# Patient Record
Sex: Female | Born: 1960 | Race: White | Hispanic: No | Marital: Married | Smoking: Current every day smoker
Health system: Southern US, Community
[De-identification: ages and names within clinical notes are randomized; demographics above are authoritative.]

## PROBLEM LIST (undated history)

## (undated) DIAGNOSIS — F329 Major depressive disorder, single episode, unspecified: Secondary | ICD-10-CM

## (undated) DIAGNOSIS — Z972 Presence of dental prosthetic device (complete) (partial): Secondary | ICD-10-CM

## (undated) DIAGNOSIS — F32A Depression, unspecified: Secondary | ICD-10-CM

## (undated) DIAGNOSIS — R011 Cardiac murmur, unspecified: Secondary | ICD-10-CM

## (undated) DIAGNOSIS — I499 Cardiac arrhythmia, unspecified: Secondary | ICD-10-CM

## (undated) DIAGNOSIS — K219 Gastro-esophageal reflux disease without esophagitis: Secondary | ICD-10-CM

## (undated) DIAGNOSIS — M1990 Unspecified osteoarthritis, unspecified site: Secondary | ICD-10-CM

## (undated) DIAGNOSIS — I10 Essential (primary) hypertension: Secondary | ICD-10-CM

## (undated) DIAGNOSIS — F419 Anxiety disorder, unspecified: Secondary | ICD-10-CM

## (undated) HISTORY — DX: Cardiac arrhythmia, unspecified: I49.9

## (undated) HISTORY — DX: Depression, unspecified: F32.A

## (undated) HISTORY — DX: Cardiac murmur, unspecified: R01.1

## (undated) HISTORY — DX: Anxiety disorder, unspecified: F41.9

## (undated) HISTORY — DX: Major depressive disorder, single episode, unspecified: F32.9

---

## 2007-04-20 ENCOUNTER — Emergency Department: Payer: Self-pay | Admitting: Emergency Medicine

## 2007-06-06 ENCOUNTER — Ambulatory Visit: Payer: Self-pay | Admitting: Unknown Physician Specialty

## 2007-06-13 ENCOUNTER — Ambulatory Visit: Payer: Self-pay | Admitting: Unknown Physician Specialty

## 2007-12-30 ENCOUNTER — Ambulatory Visit: Admission: RE | Admit: 2007-12-30 | Discharge: 2007-12-30 | Payer: Self-pay | Admitting: Orthopedic Surgery

## 2008-01-17 ENCOUNTER — Inpatient Hospital Stay (HOSPITAL_COMMUNITY): Admission: RE | Admit: 2008-01-17 | Discharge: 2008-01-20 | Payer: Self-pay | Admitting: Orthopedic Surgery

## 2008-01-17 ENCOUNTER — Encounter (INDEPENDENT_AMBULATORY_CARE_PROVIDER_SITE_OTHER): Payer: Self-pay | Admitting: Orthopedic Surgery

## 2008-08-31 HISTORY — PX: BACK SURGERY: SHX140

## 2010-05-01 ENCOUNTER — Emergency Department: Payer: Self-pay | Admitting: Emergency Medicine

## 2010-09-28 ENCOUNTER — Inpatient Hospital Stay: Payer: Self-pay | Admitting: Internal Medicine

## 2010-12-25 ENCOUNTER — Inpatient Hospital Stay: Payer: Self-pay | Admitting: Psychiatry

## 2011-01-13 NOTE — Op Note (Signed)
NAMEMARGARETA, Maria Hodge NO.:  [id number]   MEDICAL RECORD NO.:  [id number]          PATIENT TYPE:  INP   LOCATION:  5030                         FACILITY:  [facility]   PHYSICIAN:  Nelda Severe, MD      DATE OF BIRTH:  1960/09/25   DATE OF PROCEDURE:  01/17/2008  DATE OF DISCHARGE:                               OPERATIVE REPORT   SURGEON:  Nelda Severe, MD   ASSISTANT:  Lianne Cure, PA   PREOPERATIVE DIAGNOSES:  1. Status post L4-5 laminectomy.  2. L4-5 disk degeneration.  3. Spondylosis.   POSTOPERATIVE DIAGNOSES:  1. Status post L4-5 laminectomy.  2. L4-5 disk degeneration.  3. Spondylosis.   OPERATIVE PROCEDURE:  1. Left L4-5 laminectomy and foraminotomy.  2. Posterior interbody fusion L4-5 (left TLIF).  3. Posterolateral fusion L4-5 bilateral.  4. Harvest iliac crest graft, right posterior iliac crest.  5. Insertion interbody cage, L4-5.  6. Insertion pedicle screws bilaterally at L4 and L5.   OPERATIVE NOTE:  The patient was placed under general endotracheal  anesthesia.  A Foley catheter was placed in the bladder.  Prophylactic  antibiotics were administered.  The patient was positioned prone on a  Jackson frame.  Care was taken and positioned to the upper extremities  so as to avoid hyperflexion and abduction of the shoulders and so as to  avoid hyperflexion of the elbows.  The upper extremities were padded  from axillae to hands with foam.  The thighs, knees, shins, and ankles  were carefully supported on pillows with the hips and knees slightly  flexed.  Prior to positioning, the neurophysiologic technician attached  needle electrodes to the upper and lower extremities and scalp.   The previous midline incision was outlined with a skin marker and the  proposed incision marked proximal and distal to the previous incision.  A mark was also made for the proposed incision to harvest the right  posterior iliac crest graft.  The lumbar area was  then prepared with  DuraPrep and draped in a rectangular fashion.   We started by harvesting the right posterior iliac crest graft.  Incision was made into the dermis and then subcutaneous tissue was  injected with a mixture of 0.25% plain Marcaine and 1% lidocaine with  epinephrine.  The posterior iliac crest was exposed on the right side.  The gluteal fascia was released using cutting current from its origin  along the outer edge of the crest.  The muscle was elevated off the  ilium.   An osteotome was then used to harvest corticocancellous strips and a  gouge used to remove more bone from between the tables.  The bony defect  was packed with Gelfoam.  The gluteal fascia was reapposed to the iliac  crest using figure-of-eight #1 Vicryl sutures.   We then made a midline vertical incision as previously described and  excised the scar in elliptical fashion.  Dissection was carried down  onto the spinous processus of the lower lumbar spine and a cross-table  lateral radiograph taken with a Kocher and what proved to be the  L5  spinous process.  Subsequently, the L4 and L5 transverse processes were  exposed bilaterally.   Pedicle holes were made bilaterally at L4 and L5 in a usual fashion:  The junction of the superior articular process and transverse process  was identified.  At the midpoint of the junction, a hole was made using  a high-speed bur, followed by an awl to perforate the pedicle followed  by a pedicle probe to create a hole through the pedicle into the  vertebral body.  The hole was carefully probed with a ball-tip probe to  make sure it was circumferentially intact and then measured for depths  and the depths recorded.  Each hole was injected with FloSeal and a  radiopaque marker placed.  Cross-table lateral radiograph showed  satisfactory position of markers, although I felt that the L5 marker on  the right side was directed slightly to caudally and the hole was   redirected at the time of screw placement.   Next, we performed a left-sided laminectomy and inferior and superior  facetectomy at L4-5 on the left side, completely decompressing the  neural foramen.  Veins in the foraminal area were bipolar coagulated.  Bone was put through a bone mill from the locally harvested laminectomy  and inferior facetectomy and superior facetectomy.   Next, we decorticated the transverse processes on the right side at L4-  L5 and placed graft posterolaterally.  Screws were then placed in the  pedicles, and as noted the L5 hole was redirected somewhat more  proximally.  The screws were 6.5-mm diameter Theken screws.  Subsequent  to screw placement, each screw was stimulated electrically and distal  EMG activity in the lower extremities recorded.  In each instance, the  current necessary to elicit distal EMG activity.  It was above the  critical level and therefore was considered highly unlikely.  There was  any screw thread contact with a nerve root.  A pre-contoured rod was  provisionally placed and loosely coupled.   In the left side, we decorticated the transverse processes bilaterally  at L4 and L5 and placed graft posterolaterally again.  Screws were again  placed.  A working rod was placed between the screws.   We then retracted the dura at the origin of the L5 root on the left side  to allow further exposure of the disk.  The disk was fenestrated  posterolaterally used sharply.  Some initial curettage of the disk space  and removal of degenerate nucleus was carried out.  We then placed a  distraction paddle, intradiskal distractor into the disk space and  distracted up to 11 mm and then tightened the working rod.  We then  further curetted the disk space thoroughly preparing the endplates and  moderately large quantity of degenerate nucleus was removed.  We then  placed another intradiscal distractor and distract the disk space up to  13 mm and held it  in distracted position with the working rod.   We chose a 10-mm thick cage, which was packed with graft.  We used a  special funnel to deliver morcellized graft into the anterior disk space  prior to placing the cage.  The cage was placed without difficulty and  while it was impacted into place, the exiting L4 nerve root above was  protected with a Penfield #4 while the dura medially was protected with  a Love nerve root retractor.   We then removed the working rod and placed a new pre-contoured  rod and  provisionally attached it with the couplings on each end.   We then used a high-speed bur to resect the articular surfaces of the  right L4-5 facet joint and to decorticate the surface of the lamina at  L4 on the right side as well as the pars interarticularis of L5 on the  right side.  Graft was then packed in meticulously and packed into the  defect in the facet joint.  The rest of the graft was split between the  posterolateral graft on the right and left sides.   We then compressed the construct bilaterally and torqued all the  couplings.   A cross-table lateral and PA radiographs were taken.  These showed  satisfactory position of screws and rods.   I do not believe I mentioned above the both sides of this construct had  screw stimulation with distal EMG recordings prior to attaching the  rods.  These were all satisfactory with the results indicating little  likelihood of screw/ nerve contact.   The blood loss estimated 400 mL.  The patient received proximally 170 mL  of Cell Saver blood back.  The sponge and needle counts were correct.  There were no intraoperative complications.   The thoracolumbar fascia was closed using continuous #1 Vicryl suture  over a 15-gauge Blake drain, which was secured to the skin with a 2-0  nylon suture in basket weave fashion.  The 1-inch Hemovac drain was run  from the central wound through the bone graft harvest wound laterally  and also  secured with a 2-0 nylon suture in basket weave fashion.  Subcutaneous layer of both wounds was closed using interrupted and  continuous 2-0 Vicryl suture.  The skin was closed using continuous 3-0  undyed subcuticular Vicryl suture.  The skin edges were reinforced with  Steri-Strips.  A nonadherent antibiotic ointment dressing was applied  with OpSite to secure.   The patient has not awakened sufficiently to perform neurologic  examination.      Nelda Severe, MD  Electronically Signed     MT/MEDQ  D:  01/17/2008  T:  01/18/2008  Job:  [id number]

## 2011-01-13 NOTE — Discharge Summary (Signed)
Maria Hodge, Maria Hodge                ACCOUNT NO.:  [id number]   MEDICAL RECORD NO.:  [id number]          PATIENT TYPE:  INP   LOCATION:  5030                         FACILITY:  [facility]   PHYSICIAN:  Nelda Severe, MD      DATE OF BIRTH:  09-Feb-1961   DATE OF ADMISSION:  01/17/2008  DATE OF DISCHARGE:  01/20/2008                               DISCHARGE SUMMARY   REASON FOR ADMISSION/DIAGNOSES:  1. Status post L4-L5 laminectomy.  2. L4-L5 disk degeneration and spondylosis.   OPERATIVE PROCEDURE:  1. Left L4-L5 laminectomy, foraminotomy, posterior interbody fusion.  2. TLIF, posterior lateral fusion with iliac crest bone harvest on the      right, interbody cage, pedicle screws and rods at L4-L5.   Postoperatively, the patient was doing well.  She had a blood loss of  400 mL approximately.  She had good bilateral dorsiflexion.  Sensation  was grossly intact.  Significant decrease in left leg pain.  Postop day  #1, the patient was stable.  We did have the physical therapy evaluation  for ambulation and mobility.  Postop day #2, went to remove the drain.  The [place] drain was removed without difficulty.  The Hemovac drain was  sutured into the wound.  Dr. Casimiro Needle took the patient back to the OR to  remove the drain on Jan 19, 2008.  Procedure went well, just removal of  a couple sutures and the drain came out without any problems, negligible  blood loss.  The patient awoke with continued relief of leg pain on the  left.  Today, on Jan 20, 2008, the patient is afebrile.  Vital signs are  stable.  She is ambulating with a rolling walker independently.  Still  has good relief of pain.  Calves are soft.  Sensation to light touch is  grossly intact and equal.  Manual muscle testing is 5/5, bilateral lower  extremities, grossly equal.   DISCHARGE DIAGNOSIS:  1. Status post L4-L5 laminectomy.  2. L4-L5 disk degeneration and spondylosis.   DIET:  Regular.   DISPOSITION:  Stable.   PLAN:  She  is going to ambulate.  For exercise, we are giving her  rolling walker for home use and send her home on OxyContin 40 b.i.d.,  Percocet 5/325 b.i.d., and Robaxin 500 one to two q. 6 p.r.n. pain  control.  She has a followup  appointment on June 18.  She is going to maintain a dry dressing until  no drainage is on her dressing.  She may shower and then put a clean dry  dressing on and if she has any troubles or concerns, she will have to  call us.  She has to use her incentive spirometer regularly as she is a  smoker.      Lianne Cure, P.A.      Nelda Severe, MD  Electronically Signed    MC/MEDQ  D:  01/20/2008  T:  01/21/2008  Job:  [id number]

## 2011-01-13 NOTE — Op Note (Signed)
Maria Hodge, SAGER NO.:  [id number]   MEDICAL RECORD NO.:  [id number]          PATIENT TYPE:   LOCATION:                                 FACILITY:   PHYSICIAN:  Maria Severe, MD           DATE OF BIRTH:   DATE OF PROCEDURE:  01/19/2008  DATE OF DISCHARGE:                               OPERATIVE REPORT   SURGEON:  Maria Severe, MD.   ASSISTANT:  None.   PREOPERATIVE DIAGNOSIS:  Retained Hemovac drain subcutaneous layer,  status post lumbar laminectomy infusion.   POSTPROCEDURE DIAGNOSIS:  Retained Hemovac drain subcutaneous layer,  status post lumbar laminectomy infusion.   PROCEDURE:  Removal of Hemovac drain, iliac crest wound subcutaneous  layer.   OPERATIVE NOTE:  The patient was placed under general endotracheal  anesthesia.  She was positioned prone on a Wilson frame.  The lumbar  area was generously prepped with Betadine.  Square draping was applied.   Prior to prepping, I cut the Hemovac tubing leading about 1-inch stick  out of the skin.   Tracting on the Hemovac drain revealed the location of tethering, the  distal portion of the ileac crest wound.  The wound was spread apart and  about 3 or 4 subcuticular stitches removed and two subcutaneous stitches  removed.  The drain was then easily pulled out.  Two inverted 2-0 Vicryl  sutures were placed in a subcutaneous layer after thoroughly irrigating  the wound with saline.  The distal 1-inch of the wound was reapposed  using subcuticular a 3-0 undyed Vicryl in continuous fashion.   The blood loss was less than 5 mL.  There were no intraoperative  complications.  At the time of dictation, the patient has not been  examined in recovery room.  Sponge and needle counts were correct.   A Xeroform gauze dressing was applied to both lumbar and iliac crest  wounds and the dressing secured with OpSite.      Maria Severe, MD  Electronically Signed     MT/MEDQ  D:  01/19/2008  T:   01/20/2008  Job:  [id number]

## 2011-03-30 ENCOUNTER — Inpatient Hospital Stay: Payer: Self-pay | Admitting: Unknown Physician Specialty

## 2011-05-27 LAB — COMPREHENSIVE METABOLIC PANEL
ALT: 17
AST: 17
Calcium: 9.5
GFR calc Af Amer: >60
Sodium: 137
Total Protein: 7.7

## 2011-05-27 LAB — CBC
HCT: 36.1
HCT: 37.5
Hemoglobin: 12.4
MCHC: 34.3
MCHC: 34.5
MCHC: 34.8
MCV: 90.9
MCV: 91
MCV: 91.2
Platelets: 161
Platelets: 184
RBC: 3.85 — ABNORMAL LOW
RBC: 3.97
RDW: 14.3
WBC: 13.5 — ABNORMAL HIGH
WBC: 16.5 — ABNORMAL HIGH
WBC: 18.3 — ABNORMAL HIGH

## 2011-05-27 LAB — URINE MICROSCOPIC-ADD ON

## 2011-05-27 LAB — BASIC METABOLIC PANEL
BUN: 2 — ABNORMAL LOW
BUN: 3 — ABNORMAL LOW
CO2: 26
CO2: 26
Calcium: 8.7
Chloride: 102
Chloride: 106
Chloride: 108
Creatinine, Ser: 0.49
Creatinine, Ser: 0.6
GFR calc Af Amer: >60
GFR calc Af Amer: >60
GFR calc non Af Amer: >60
Potassium: 3.5
Potassium: 3.6
Sodium: 137

## 2011-05-27 LAB — URINALYSIS, ROUTINE W REFLEX MICROSCOPIC
Leukocytes, UA: NEGATIVE
Nitrite: NEGATIVE
Specific Gravity, Urine: 1.016
Urobilinogen, UA: 0.2

## 2011-05-27 LAB — DIFFERENTIAL
Eosinophils Absolute: 0
Eosinophils Relative: 0
Lymphocytes Relative: 21
Lymphs Abs: 2
Monocytes Relative: 4
Neutrophils Relative %: 73

## 2011-05-27 LAB — TYPE AND SCREEN
ABO/RH(D): O NEG
Antibody Screen: NEGATIVE

## 2011-05-27 LAB — PROTIME-INR: INR: 0.9

## 2011-05-27 LAB — URINE CULTURE

## 2011-06-04 ENCOUNTER — Inpatient Hospital Stay: Payer: Self-pay | Admitting: Psychiatry

## 2011-09-10 ENCOUNTER — Inpatient Hospital Stay: Payer: Self-pay | Admitting: Psychiatry

## 2011-09-10 LAB — CBC
HGB: 15.9 g/dL (ref 12.0–16.0)
MCH: 29.7 pg (ref 26.0–34.0)
MCHC: 33.1 g/dL (ref 32.0–36.0)
RBC: 5.37 10*6/uL — ABNORMAL HIGH (ref 3.80–5.20)
RDW: 15 % — ABNORMAL HIGH (ref 11.5–14.5)
WBC: 12 10*3/uL — ABNORMAL HIGH (ref 3.6–11.0)

## 2011-09-10 LAB — URINALYSIS, COMPLETE
Bacteria: NONE SEEN
Ketone: NEGATIVE
Nitrite: NEGATIVE
Protein: NEGATIVE
RBC,UR: 1 /HPF (ref 0–5)
Specific Gravity: 1.005 (ref 1.003–1.030)
WBC UR: <1 /HPF (ref 0–5)

## 2011-09-10 LAB — DRUG SCREEN, URINE
Barbiturates, Ur Screen: NEGATIVE (ref ?–200)
Cocaine Metabolite,Ur ~~LOC~~: NEGATIVE (ref ?–300)
Tricyclic, Ur Screen: NEGATIVE (ref ?–1000)

## 2011-09-10 LAB — COMPREHENSIVE METABOLIC PANEL
Albumin: 4.5 g/dL (ref 3.4–5.0)
Anion Gap: 9 (ref 7–16)
BUN: 12 mg/dL (ref 7–18)
Chloride: 102 mmol/L (ref 98–107)
EGFR (African American): >60
Osmolality: 277 (ref 275–301)
Potassium: 3.9 mmol/L (ref 3.5–5.1)
SGOT(AST): 19 U/L (ref 15–37)
Sodium: 139 mmol/L (ref 136–145)
Total Protein: 8.8 g/dL — ABNORMAL HIGH (ref 6.4–8.2)

## 2011-09-10 LAB — ETHANOL: Ethanol %: <0.003 % (ref 0.000–0.080)

## 2011-09-24 ENCOUNTER — Emergency Department: Payer: Self-pay | Admitting: *Deleted

## 2011-09-24 LAB — COMPREHENSIVE METABOLIC PANEL
Albumin: 4.3 g/dL (ref 3.4–5.0)
Anion Gap: 11 (ref 7–16)
BUN: 10 mg/dL (ref 7–18)
Calcium, Total: 9.6 mg/dL (ref 8.5–10.1)
Chloride: 103 mmol/L (ref 98–107)
Co2: 28 mmol/L (ref 21–32)
EGFR (African American): >60
EGFR (Non-African Amer.): >60
Glucose: 120 mg/dL — ABNORMAL HIGH (ref 65–99)
Osmolality: 283 (ref 275–301)
Potassium: 3.5 mmol/L (ref 3.5–5.1)
SGOT(AST): 19 U/L (ref 15–37)

## 2011-09-24 LAB — URINALYSIS, COMPLETE
Blood: NEGATIVE
Glucose,UR: NEGATIVE mg/dL (ref 0–75)
Hyaline Cast: 9
Nitrite: NEGATIVE
Specific Gravity: 1.028 (ref 1.003–1.030)
Squamous Epithelial: 3
WBC UR: 5 /HPF (ref 0–5)

## 2011-09-24 LAB — LIPASE, BLOOD: Lipase: 87 U/L (ref 73–393)

## 2011-09-24 LAB — CBC
HCT: 43.9 % (ref 35.0–47.0)
RDW: 16.1 % — ABNORMAL HIGH (ref 11.5–14.5)
WBC: 14.9 10*3/uL — ABNORMAL HIGH (ref 3.6–11.0)

## 2011-11-02 ENCOUNTER — Emergency Department: Payer: Self-pay | Admitting: Emergency Medicine

## 2011-11-02 LAB — DRUG SCREEN, URINE
Amphetamines, Ur Screen: NEGATIVE (ref ?–1000)
Cannabinoid 50 Ng, Ur ~~LOC~~: POSITIVE (ref ?–50)
Cocaine Metabolite,Ur ~~LOC~~: NEGATIVE (ref ?–300)
MDMA (Ecstasy)Ur Screen: NEGATIVE (ref ?–500)
Phencyclidine (PCP) Ur S: NEGATIVE (ref ?–25)

## 2011-11-02 LAB — CBC
HCT: 43.7 % (ref 35.0–47.0)
HGB: 14.3 g/dL (ref 12.0–16.0)
MCH: 30.1 pg (ref 26.0–34.0)
MCV: 92 fL (ref 80–100)
RBC: 4.75 10*6/uL (ref 3.80–5.20)
WBC: 8.1 10*3/uL (ref 3.6–11.0)

## 2011-11-02 LAB — SALICYLATE LEVEL: Salicylates, Serum: 4.6 mg/dL — ABNORMAL HIGH

## 2011-11-02 LAB — COMPREHENSIVE METABOLIC PANEL
Anion Gap: 10 (ref 7–16)
Bilirubin,Total: 0.4 mg/dL (ref 0.2–1.0)
Chloride: 105 mmol/L (ref 98–107)
Co2: 26 mmol/L (ref 21–32)
Creatinine: 0.73 mg/dL (ref 0.60–1.30)
EGFR (African American): >60
EGFR (Non-African Amer.): >60
Osmolality: 280 (ref 275–301)
Potassium: 3.7 mmol/L (ref 3.5–5.1)
Sodium: 141 mmol/L (ref 136–145)

## 2011-11-02 LAB — TSH: Thyroid Stimulating Horm: 0.81 u[IU]/mL

## 2011-11-02 LAB — ETHANOL
Ethanol %: <0.003 % (ref 0.000–0.080)
Ethanol: <3 mg/dL

## 2011-11-17 ENCOUNTER — Ambulatory Visit: Payer: Self-pay

## 2011-12-21 ENCOUNTER — Ambulatory Visit (HOSPITAL_BASED_OUTPATIENT_CLINIC_OR_DEPARTMENT_OTHER)
Admission: RE | Admit: 2011-12-21 | Discharge: 2011-12-21 | Disposition: A | Discharge status: Home / Self Care | Payer: Medicare Other | Source: Ambulatory Visit | Attending: Orthopedic Surgery | Admitting: Orthopedic Surgery

## 2011-12-21 ENCOUNTER — Ambulatory Visit (HOSPITAL_COMMUNITY): Payer: Medicare Other

## 2011-12-21 ENCOUNTER — Encounter (HOSPITAL_BASED_OUTPATIENT_CLINIC_OR_DEPARTMENT_OTHER)
Admission: RE | Disposition: A | Discharge status: Home / Self Care | Payer: Self-pay | Source: Ambulatory Visit | Attending: Orthopedic Surgery

## 2011-12-21 DIAGNOSIS — M961 Postlaminectomy syndrome, not elsewhere classified: Secondary | ICD-10-CM | POA: Insufficient documentation

## 2011-12-21 DIAGNOSIS — Z981 Arthrodesis status: Secondary | ICD-10-CM | POA: Insufficient documentation

## 2011-12-21 HISTORY — PX: STERIOD INJECTION: SHX5046

## 2011-12-21 SURGERY — MINOR STEROID INJECTION
Anesthesia: LOCAL | Site: Back | Laterality: Right

## 2011-12-21 MED ORDER — IOHEXOL 300 MG/ML  SOLN
INTRAMUSCULAR | Status: DC | PRN
  Administered 2011-12-21: .5 mL via EPIDURAL

## 2011-12-21 MED ORDER — METHYLPREDNISOLONE ACETATE 40 MG/ML IJ SUSP
INTRAMUSCULAR | Status: DC | PRN
  Administered 2011-12-21: 12:00:00 via EPIDURAL

## 2011-12-21 SURGICAL SUPPLY — 14 items
BANDAGE ADHESIVE 1X3 (GAUZE/BANDAGES/DRESSINGS) ×4 IMPLANT
CHLORAPREP W/TINT 26ML (MISCELLANEOUS) ×2 IMPLANT
GLOVE BIO SURGEON STRL SZ8 (GLOVE) ×4 IMPLANT
GLOVE SS BIOGEL STRL SZ 8.5 (GLOVE) ×2 IMPLANT
GLOVE SUPERSENSE BIOGEL SZ 8.5 (GLOVE) ×2
NDL SAFETY ECLIPSE 18X1.5 (NEEDLE) ×2 IMPLANT
NEEDLE HYPO 18GX1.5 SHARP (NEEDLE) ×2
NEEDLE SPNL 22GX3.5 QUINCKE BK (NEEDLE) ×2 IMPLANT
NEEDLE SPNL 22GX5 LNG QUINC BK (NEEDLE) IMPLANT
NEEDLE SPNL 22GX7 QUINCKE BK (NEEDLE) IMPLANT
SYR 3ML 23GX1 SAFETY (SYRINGE) ×2 IMPLANT
SYR 5ML LL (SYRINGE) ×2 IMPLANT
SYR CONTROL 10ML LL (SYRINGE) IMPLANT
TOWEL OR 17X24 6PK STRL BLUE (TOWEL DISPOSABLE) ×2 IMPLANT

## 2011-12-21 NOTE — Op Note (Signed)
Preprocedure diagnosis: Right L4, L3 nerve root pain   Post procedure diagnosis: The same  Procedure: Right L3-4 transforaminal epidural steroid injection, attempted right L4-5 epidural steroid injection( 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); fluoroscopic guidance  Procedure note:  3 mL of Omnipaque were drawn into a 3 cc syringe. In a  5 cc syringe, 2 mL of preservative-free, plain 1/4 % Marcaine were mixed with 2 mL of preservative free 1% plain lidocaine and 40 mg of Depo-Medrol. The patient was positioned prone with a bump under the right hip to provide oblique angulation. A 22-gauge spinal needle was introduced at the L3-4 interval and the tip advanced to the L3-4 neural foramen on the right side. Omnipaque was injected and flow epidurally. 1.5 cc of injectate was delivered. The needle was withdrawn.  An attempt was then made to position the needle tip at the L4-5 neural foramen, secondary to prior fusion, I was unable to advance the needle into the foraminal area. The needle was withdrawn. Pressure was applied to the needle tract at L4-5 as well as a small amount of bleeding.  Subsequent to the procedure the patient was transferred back to the preop area. No sedation was used.

## 2011-12-22 ENCOUNTER — Encounter (HOSPITAL_BASED_OUTPATIENT_CLINIC_OR_DEPARTMENT_OTHER): Payer: Self-pay

## 2011-12-22 ENCOUNTER — Encounter (HOSPITAL_BASED_OUTPATIENT_CLINIC_OR_DEPARTMENT_OTHER): Payer: Self-pay | Admitting: Orthopedic Surgery

## 2012-01-24 ENCOUNTER — Inpatient Hospital Stay: Payer: Self-pay | Admitting: Psychiatry

## 2012-01-24 LAB — DRUG SCREEN, URINE
Amphetamines, Ur Screen: NEGATIVE (ref ?–1000)
Benzodiazepine, Ur Scrn: POSITIVE (ref ?–200)
MDMA (Ecstasy)Ur Screen: NEGATIVE (ref ?–500)
Methadone, Ur Screen: NEGATIVE (ref ?–300)
Phencyclidine (PCP) Ur S: NEGATIVE (ref ?–25)
Tricyclic, Ur Screen: POSITIVE (ref ?–1000)

## 2012-01-24 LAB — URINALYSIS, COMPLETE
Bilirubin,UR: NEGATIVE
Blood: NEGATIVE
Glucose,UR: NEGATIVE mg/dL (ref 0–75)
Hyaline Cast: 2
Nitrite: NEGATIVE
Ph: 7 (ref 4.5–8.0)
Protein: 30
Specific Gravity: 1.018 (ref 1.003–1.030)
Squamous Epithelial: 5
WBC UR: 5 /HPF (ref 0–5)

## 2012-01-24 LAB — COMPREHENSIVE METABOLIC PANEL
Alkaline Phosphatase: 87 U/L (ref 50–136)
Anion Gap: 10 (ref 7–16)
Bilirubin,Total: 0.5 mg/dL (ref 0.2–1.0)
Calcium, Total: 10.1 mg/dL (ref 8.5–10.1)
Co2: 26 mmol/L (ref 21–32)
Creatinine: 0.71 mg/dL (ref 0.60–1.30)
EGFR (African American): >60
Glucose: 120 mg/dL — ABNORMAL HIGH (ref 65–99)
Potassium: 4 mmol/L (ref 3.5–5.1)
SGOT(AST): 13 U/L — ABNORMAL LOW (ref 15–37)
Sodium: 132 mmol/L — ABNORMAL LOW (ref 136–145)

## 2012-01-24 LAB — CBC
HCT: 48.6 % — ABNORMAL HIGH (ref 35.0–47.0)
HGB: 15.9 g/dL (ref 12.0–16.0)
MCH: 29.9 pg (ref 26.0–34.0)
Platelet: 266 10*3/uL (ref 150–440)
RDW: 14 % (ref 11.5–14.5)

## 2012-01-24 LAB — ETHANOL: Ethanol %: <0.003 % (ref 0.000–0.080)

## 2012-01-26 LAB — URINALYSIS, COMPLETE
Bacteria: NONE SEEN
Bilirubin,UR: NEGATIVE
Blood: NEGATIVE
Leukocyte Esterase: NEGATIVE
Ph: 7 (ref 4.5–8.0)
WBC UR: 3 /HPF (ref 0–5)

## 2012-01-26 LAB — CBC WITH DIFFERENTIAL/PLATELET
Basophil #: 0.1 10*3/uL (ref 0.0–0.1)
Basophil %: 1 %
Eosinophil #: 0.3 10*3/uL (ref 0.0–0.7)
HGB: 14.4 g/dL (ref 12.0–16.0)
Lymphocyte #: 3.7 10*3/uL — ABNORMAL HIGH (ref 1.0–3.6)
Lymphocyte %: 34.3 %
Monocyte %: 7.9 %
Neutrophil #: 5.9 10*3/uL (ref 1.4–6.5)
RBC: 4.78 10*6/uL (ref 3.80–5.20)
RDW: 13.8 % (ref 11.5–14.5)

## 2012-01-26 LAB — T4, FREE: Free Thyroxine: 1.1 ng/dL (ref 0.76–1.46)

## 2012-02-22 ENCOUNTER — Inpatient Hospital Stay: Payer: Self-pay | Admitting: Psychiatry

## 2012-02-22 LAB — COMPREHENSIVE METABOLIC PANEL
Anion Gap: 8 (ref 7–16)
BUN: 14 mg/dL (ref 7–18)
Bilirubin,Total: 0.4 mg/dL (ref 0.2–1.0)
Calcium, Total: 9.3 mg/dL (ref 8.5–10.1)
Chloride: 101 mmol/L (ref 98–107)
Creatinine: 0.75 mg/dL (ref 0.60–1.30)
EGFR (African American): >60
EGFR (Non-African Amer.): >60
Glucose: 112 mg/dL — ABNORMAL HIGH (ref 65–99)
Osmolality: 271 (ref 275–301)
Potassium: 4.2 mmol/L (ref 3.5–5.1)
SGOT(AST): 16 U/L (ref 15–37)
Sodium: 135 mmol/L — ABNORMAL LOW (ref 136–145)

## 2012-02-22 LAB — CBC
HCT: 45 % (ref 35.0–47.0)
MCH: 30.7 pg (ref 26.0–34.0)
MCHC: 33.5 g/dL (ref 32.0–36.0)
MCV: 92 fL (ref 80–100)
Platelet: 212 10*3/uL (ref 150–440)
RDW: 14.2 % (ref 11.5–14.5)

## 2012-02-22 LAB — DRUG SCREEN, URINE
Cannabinoid 50 Ng, Ur ~~LOC~~: POSITIVE (ref ?–50)
Cocaine Metabolite,Ur ~~LOC~~: NEGATIVE (ref ?–300)
Tricyclic, Ur Screen: POSITIVE (ref ?–1000)

## 2012-02-23 LAB — URINALYSIS, COMPLETE
Bacteria: NONE SEEN
Glucose,UR: NEGATIVE mg/dL (ref 0–75)
Ketone: NEGATIVE
Nitrite: NEGATIVE
Ph: 5 (ref 4.5–8.0)
Protein: NEGATIVE
Specific Gravity: 1.028 (ref 1.003–1.030)

## 2012-02-24 LAB — LIPID PANEL
Cholesterol: 199 mg/dL (ref 0–200)
HDL Cholesterol: 46 mg/dL (ref 40–60)
Triglycerides: 62 mg/dL (ref 0–200)

## 2012-04-02 ENCOUNTER — Inpatient Hospital Stay: Payer: Self-pay | Admitting: Psychiatry

## 2012-04-02 LAB — COMPREHENSIVE METABOLIC PANEL
Albumin: 4.1 g/dL (ref 3.4–5.0)
Anion Gap: 7 (ref 7–16)
BUN: 13 mg/dL (ref 7–18)
Bilirubin,Total: 0.3 mg/dL (ref 0.2–1.0)
Chloride: 101 mmol/L (ref 98–107)
EGFR (African American): >60
Potassium: 3.8 mmol/L (ref 3.5–5.1)
SGOT(AST): 23 U/L (ref 15–37)
Total Protein: 8.4 g/dL — ABNORMAL HIGH (ref 6.4–8.2)

## 2012-04-02 LAB — URINALYSIS, COMPLETE
Bilirubin,UR: NEGATIVE
Glucose,UR: NEGATIVE mg/dL (ref 0–75)
Leukocyte Esterase: NEGATIVE
Nitrite: NEGATIVE
Ph: 7 (ref 4.5–8.0)
Protein: NEGATIVE
RBC,UR: 1 /HPF (ref 0–5)
WBC UR: <1 /HPF (ref 0–5)

## 2012-04-02 LAB — CBC
HCT: 44.6 % (ref 35.0–47.0)
HGB: 14.8 g/dL (ref 12.0–16.0)
MCHC: 33.3 g/dL (ref 32.0–36.0)
RBC: 4.83 10*6/uL (ref 3.80–5.20)

## 2012-04-02 LAB — DRUG SCREEN, URINE
Amphetamines, Ur Screen: NEGATIVE (ref ?–1000)
Barbiturates, Ur Screen: NEGATIVE (ref ?–200)
Cocaine Metabolite,Ur ~~LOC~~: NEGATIVE (ref ?–300)
Methadone, Ur Screen: NEGATIVE (ref ?–300)
Opiate, Ur Screen: POSITIVE (ref ?–300)
Tricyclic, Ur Screen: POSITIVE (ref ?–1000)

## 2012-04-02 LAB — TSH: Thyroid Stimulating Horm: 1.41 u[IU]/mL

## 2012-04-02 LAB — ETHANOL: Ethanol %: <0.003 % (ref 0.000–0.080)

## 2012-04-02 LAB — ACETAMINOPHEN LEVEL: Acetaminophen: 5 ug/mL — ABNORMAL LOW

## 2012-04-11 ENCOUNTER — Ambulatory Visit: Payer: Self-pay | Admitting: Pain Medicine

## 2012-05-03 ENCOUNTER — Ambulatory Visit: Payer: Self-pay | Admitting: Pain Medicine

## 2012-05-18 ENCOUNTER — Ambulatory Visit: Payer: Self-pay | Admitting: Pain Medicine

## 2012-05-27 ENCOUNTER — Inpatient Hospital Stay: Payer: Self-pay | Admitting: Psychiatry

## 2012-05-27 LAB — COMPREHENSIVE METABOLIC PANEL
Albumin: 4 g/dL (ref 3.4–5.0)
Alkaline Phosphatase: 88 U/L (ref 50–136)
Calcium, Total: 9.5 mg/dL (ref 8.5–10.1)
Co2: 26 mmol/L (ref 21–32)
EGFR (Non-African Amer.): >60
Glucose: 110 mg/dL — ABNORMAL HIGH (ref 65–99)
SGOT(AST): 15 U/L (ref 15–37)
SGPT (ALT): 15 U/L (ref 12–78)
Sodium: 140 mmol/L (ref 136–145)

## 2012-05-27 LAB — DRUG SCREEN, URINE
Amphetamines, Ur Screen: NEGATIVE (ref ?–1000)
Barbiturates, Ur Screen: NEGATIVE (ref ?–200)
Cocaine Metabolite,Ur ~~LOC~~: NEGATIVE (ref ?–300)
MDMA (Ecstasy)Ur Screen: NEGATIVE (ref ?–500)
Opiate, Ur Screen: POSITIVE (ref ?–300)
Tricyclic, Ur Screen: NEGATIVE (ref ?–1000)

## 2012-05-27 LAB — CBC
MCH: 30.6 pg (ref 26.0–34.0)
MCHC: 33.7 g/dL (ref 32.0–36.0)
MCV: 91 fL (ref 80–100)
Platelet: 222 10*3/uL (ref 150–440)
RBC: 4.85 10*6/uL (ref 3.80–5.20)
RDW: 13.7 % (ref 11.5–14.5)

## 2012-05-27 LAB — URINALYSIS, COMPLETE
Blood: NEGATIVE
Ketone: NEGATIVE
Leukocyte Esterase: NEGATIVE
Nitrite: NEGATIVE
Ph: 7 (ref 4.5–8.0)
Protein: NEGATIVE
RBC,UR: 5 /HPF (ref 0–5)
Specific Gravity: 1.018 (ref 1.003–1.030)
WBC UR: <1 /HPF (ref 0–5)

## 2012-05-27 LAB — SALICYLATE LEVEL: Salicylates, Serum: 3.7 mg/dL — ABNORMAL HIGH

## 2012-05-27 LAB — ACETAMINOPHEN LEVEL: Acetaminophen: <2 ug/mL

## 2012-06-03 LAB — LIPID PANEL: Triglycerides: 64 mg/dL (ref 0–200)

## 2012-06-07 ENCOUNTER — Ambulatory Visit: Payer: Self-pay | Admitting: Pain Medicine

## 2012-06-22 ENCOUNTER — Ambulatory Visit: Payer: Self-pay | Admitting: Pain Medicine

## 2012-06-28 ENCOUNTER — Ambulatory Visit: Payer: Self-pay | Admitting: Pain Medicine

## 2012-07-05 ENCOUNTER — Inpatient Hospital Stay: Payer: Self-pay | Admitting: Psychiatry

## 2012-07-05 LAB — DRUG SCREEN, URINE
Amphetamines, Ur Screen: NEGATIVE (ref ?–1000)
Benzodiazepine, Ur Scrn: POSITIVE (ref ?–200)
Cannabinoid 50 Ng, Ur ~~LOC~~: POSITIVE (ref ?–50)
MDMA (Ecstasy)Ur Screen: NEGATIVE (ref ?–500)
Methadone, Ur Screen: NEGATIVE (ref ?–300)

## 2012-07-05 LAB — URINALYSIS, COMPLETE
Bacteria: NONE SEEN
Bilirubin,UR: NEGATIVE
Blood: NEGATIVE
Glucose,UR: NEGATIVE mg/dL (ref 0–75)
Leukocyte Esterase: NEGATIVE
Nitrite: NEGATIVE
Squamous Epithelial: <1
WBC UR: 1 /HPF (ref 0–5)

## 2012-07-05 LAB — COMPREHENSIVE METABOLIC PANEL
Albumin: 4.1 g/dL (ref 3.4–5.0)
Anion Gap: 8 (ref 7–16)
Calcium, Total: 9.2 mg/dL (ref 8.5–10.1)
Chloride: 104 mmol/L (ref 98–107)
Co2: 29 mmol/L (ref 21–32)
EGFR (African American): >60
Glucose: 115 mg/dL — ABNORMAL HIGH (ref 65–99)
Osmolality: 282 (ref 275–301)
Potassium: 4.1 mmol/L (ref 3.5–5.1)
Sodium: 141 mmol/L (ref 136–145)

## 2012-07-05 LAB — ACETAMINOPHEN LEVEL: Acetaminophen: <2 ug/mL

## 2012-07-05 LAB — CBC
HCT: 43.1 % (ref 35.0–47.0)
HGB: 14 g/dL (ref 12.0–16.0)
RDW: 14.8 % — ABNORMAL HIGH (ref 11.5–14.5)
WBC: 14.4 10*3/uL — ABNORMAL HIGH (ref 3.6–11.0)

## 2012-07-05 LAB — SALICYLATE LEVEL: Salicylates, Serum: <1.7 mg/dL

## 2012-07-05 LAB — TSH: Thyroid Stimulating Horm: 1.71 u[IU]/mL

## 2012-07-05 LAB — ETHANOL: Ethanol: <3 mg/dL

## 2012-07-06 LAB — CBC WITH DIFFERENTIAL/PLATELET
Basophil %: 0.6 %
Eosinophil %: 0.6 %
HCT: 43.6 % (ref 35.0–47.0)
Lymphocyte %: 13.5 %
MCH: 29.7 pg (ref 26.0–34.0)
MCHC: 32.8 g/dL (ref 32.0–36.0)
Monocyte %: 7.9 %
Neutrophil #: 9.2 10*3/uL — ABNORMAL HIGH (ref 1.4–6.5)
Neutrophil %: 77.4 %
RDW: 14.4 % (ref 11.5–14.5)

## 2012-07-06 LAB — COMPREHENSIVE METABOLIC PANEL
Albumin: 3.9 g/dL (ref 3.4–5.0)
Anion Gap: 9 (ref 7–16)
BUN: 9 mg/dL (ref 7–18)
Glucose: 108 mg/dL — ABNORMAL HIGH (ref 65–99)
Osmolality: 271 (ref 275–301)
Potassium: 4 mmol/L (ref 3.5–5.1)
SGOT(AST): 18 U/L (ref 15–37)
Sodium: 136 mmol/L (ref 136–145)
Total Protein: 8 g/dL (ref 6.4–8.2)

## 2012-07-07 LAB — CK: CK, Total: 50 U/L (ref 21–215)

## 2012-07-07 LAB — BASIC METABOLIC PANEL
Calcium, Total: 9.5 mg/dL (ref 8.5–10.1)
Chloride: 98 mmol/L (ref 98–107)
Co2: 27 mmol/L (ref 21–32)
Osmolality: 269 (ref 275–301)
Potassium: 4.5 mmol/L (ref 3.5–5.1)
Sodium: 134 mmol/L — ABNORMAL LOW (ref 136–145)

## 2012-07-07 LAB — CBC WITH DIFFERENTIAL/PLATELET
Basophil %: 0.7 %
Eosinophil #: 0.1 10*3/uL (ref 0.0–0.7)
HCT: 45 % (ref 35.0–47.0)
MCH: 30.4 pg (ref 26.0–34.0)
MCV: 91 fL (ref 80–100)
Monocyte #: 0.7 x10 3/mm (ref 0.2–0.9)
Neutrophil %: 76.3 %
Platelet: 254 10*3/uL (ref 150–440)

## 2012-07-20 ENCOUNTER — Ambulatory Visit: Payer: Self-pay | Admitting: Pain Medicine

## 2012-08-18 ENCOUNTER — Ambulatory Visit: Payer: Self-pay | Admitting: Pain Medicine

## 2012-09-19 ENCOUNTER — Ambulatory Visit: Payer: Self-pay | Admitting: Pain Medicine

## 2012-10-17 ENCOUNTER — Ambulatory Visit: Payer: Self-pay | Admitting: Pain Medicine

## 2013-01-20 ENCOUNTER — Inpatient Hospital Stay: Payer: Self-pay | Admitting: Psychiatry

## 2013-01-20 LAB — COMPREHENSIVE METABOLIC PANEL
Albumin: 4.1 g/dL (ref 3.4–5.0)
Alkaline Phosphatase: 97 U/L (ref 50–136)
Anion Gap: 7 (ref 7–16)
BUN: 8 mg/dL (ref 7–18)
Bilirubin,Total: 0.3 mg/dL (ref 0.2–1.0)
Calcium, Total: 9.7 mg/dL (ref 8.5–10.1)
Co2: 27 mmol/L (ref 21–32)
Creatinine: 0.95 mg/dL (ref 0.60–1.30)
EGFR (African American): >60
EGFR (Non-African Amer.): >60
Glucose: 89 mg/dL (ref 65–99)
Osmolality: 273 (ref 275–301)
SGPT (ALT): 16 U/L (ref 12–78)
Sodium: 138 mmol/L (ref 136–145)
Total Protein: 8.7 g/dL — ABNORMAL HIGH (ref 6.4–8.2)

## 2013-01-20 LAB — DRUG SCREEN, URINE
Amphetamines, Ur Screen: NEGATIVE (ref ?–1000)
Barbiturates, Ur Screen: NEGATIVE (ref ?–200)
Cocaine Metabolite,Ur ~~LOC~~: NEGATIVE (ref ?–300)
MDMA (Ecstasy)Ur Screen: NEGATIVE (ref ?–500)
Opiate, Ur Screen: POSITIVE (ref ?–300)
Tricyclic, Ur Screen: POSITIVE (ref ?–1000)

## 2013-01-20 LAB — URINALYSIS, COMPLETE
Bilirubin,UR: NEGATIVE
Hyaline Cast: 2
Nitrite: NEGATIVE
Protein: NEGATIVE
Specific Gravity: 1.019 (ref 1.003–1.030)
Squamous Epithelial: 41
WBC UR: 22 /HPF (ref 0–5)

## 2013-01-20 LAB — CBC
Platelet: 256 10*3/uL (ref 150–440)
WBC: 10.3 10*3/uL (ref 3.6–11.0)

## 2013-01-20 LAB — ETHANOL: Ethanol %: <0.003 % (ref 0.000–0.080)

## 2013-01-20 LAB — TSH: Thyroid Stimulating Horm: 1.5 u[IU]/mL

## 2013-02-10 ENCOUNTER — Ambulatory Visit: Payer: Self-pay | Admitting: Pain Medicine

## 2013-02-14 ENCOUNTER — Ambulatory Visit: Payer: Self-pay | Admitting: Pain Medicine

## 2013-03-06 ENCOUNTER — Ambulatory Visit: Payer: Self-pay | Admitting: Pain Medicine

## 2013-03-15 ENCOUNTER — Ambulatory Visit: Payer: Self-pay | Admitting: Pain Medicine

## 2013-03-21 ENCOUNTER — Ambulatory Visit: Payer: Self-pay | Admitting: Pain Medicine

## 2013-04-04 ENCOUNTER — Ambulatory Visit: Payer: Self-pay | Admitting: Otolaryngology

## 2013-07-03 ENCOUNTER — Emergency Department: Payer: Self-pay | Admitting: Emergency Medicine

## 2013-07-03 LAB — COMPREHENSIVE METABOLIC PANEL
Albumin: 4 g/dL (ref 3.4–5.0)
Anion Gap: 2 — ABNORMAL LOW (ref 7–16)
BUN: 14 mg/dL (ref 7–18)
Calcium, Total: 9 mg/dL (ref 8.5–10.1)
Chloride: 107 mmol/L (ref 98–107)
Co2: 27 mmol/L (ref 21–32)
Creatinine: 1.05 mg/dL (ref 0.60–1.30)
Glucose: 113 mg/dL — ABNORMAL HIGH (ref 65–99)
Potassium: 3.8 mmol/L (ref 3.5–5.1)
SGOT(AST): 18 U/L (ref 15–37)
SGPT (ALT): 14 U/L (ref 12–78)
Total Protein: 8.1 g/dL (ref 6.4–8.2)

## 2013-07-03 LAB — CBC
HCT: 46.4 % (ref 35.0–47.0)
HGB: 15.6 g/dL (ref 12.0–16.0)
MCH: 29.5 pg (ref 26.0–34.0)
MCHC: 33.7 g/dL (ref 32.0–36.0)
RDW: 15.3 % — ABNORMAL HIGH (ref 11.5–14.5)

## 2013-07-03 LAB — PROTIME-INR
INR: 1
Prothrombin Time: 13.5 secs (ref 11.5–14.7)

## 2013-07-03 LAB — APTT: Activated PTT: 40.8 secs — ABNORMAL HIGH (ref 23.6–35.9)

## 2013-07-04 LAB — URINALYSIS, COMPLETE
Bilirubin,UR: NEGATIVE
Nitrite: NEGATIVE
Protein: 30
RBC,UR: 20 /HPF (ref 0–5)
Specific Gravity: 1.029 (ref 1.003–1.030)
Squamous Epithelial: 17
WBC UR: 5 /HPF (ref 0–5)

## 2013-07-04 LAB — TROPONIN I: Troponin-I: <0.02 ng/mL

## 2013-07-04 LAB — TSH: Thyroid Stimulating Horm: 1.51 u[IU]/mL

## 2013-12-02 ENCOUNTER — Emergency Department: Payer: Self-pay | Admitting: Emergency Medicine

## 2013-12-02 LAB — URINALYSIS, COMPLETE
BILIRUBIN, UR: NEGATIVE
Bacteria: NONE SEEN
Blood: NEGATIVE
Glucose,UR: NEGATIVE mg/dL (ref 0–75)
Leukocyte Esterase: NEGATIVE
Nitrite: NEGATIVE
PH: 7 (ref 4.5–8.0)
Protein: NEGATIVE
Specific Gravity: 1.017 (ref 1.003–1.030)
Squamous Epithelial: 48

## 2014-01-06 LAB — CBC
HCT: 46.8 % (ref 35.0–47.0)
HGB: 15.8 g/dL (ref 12.0–16.0)
MCH: 29.7 pg (ref 26.0–34.0)
MCHC: 33.8 g/dL (ref 32.0–36.0)
MCV: 88 fL (ref 80–100)
Platelet: 257 10*3/uL (ref 150–440)
RBC: 5.32 10*6/uL — ABNORMAL HIGH (ref 3.80–5.20)
RDW: 15.1 % — ABNORMAL HIGH (ref 11.5–14.5)
WBC: 8.4 10*3/uL (ref 3.6–11.0)

## 2014-01-06 LAB — DRUG SCREEN, URINE
Amphetamines, Ur Screen: NEGATIVE (ref ?–1000)
Barbiturates, Ur Screen: NEGATIVE (ref ?–200)
Benzodiazepine, Ur Scrn: POSITIVE (ref ?–200)
Cannabinoid 50 Ng, Ur ~~LOC~~: POSITIVE (ref ?–50)
Cocaine Metabolite,Ur ~~LOC~~: NEGATIVE (ref ?–300)
MDMA (ECSTASY) UR SCREEN: NEGATIVE (ref ?–500)
Methadone, Ur Screen: NEGATIVE (ref ?–300)
Opiate, Ur Screen: POSITIVE (ref ?–300)
PHENCYCLIDINE (PCP) UR S: NEGATIVE (ref ?–25)
TRICYCLIC, UR SCREEN: POSITIVE (ref ?–1000)

## 2014-01-06 LAB — COMPREHENSIVE METABOLIC PANEL
Albumin: 3.8 g/dL (ref 3.4–5.0)
Alkaline Phosphatase: 92 U/L
Anion Gap: 6 — ABNORMAL LOW (ref 7–16)
BUN: 10 mg/dL (ref 7–18)
Bilirubin,Total: 0.3 mg/dL (ref 0.2–1.0)
CREATININE: 0.84 mg/dL (ref 0.60–1.30)
Calcium, Total: 9.1 mg/dL (ref 8.5–10.1)
Chloride: 109 mmol/L — ABNORMAL HIGH (ref 98–107)
Co2: 26 mmol/L (ref 21–32)
EGFR (African American): >60
EGFR (Non-African Amer.): >60
Glucose: 109 mg/dL — ABNORMAL HIGH (ref 65–99)
Osmolality: 281 (ref 275–301)
Potassium: 3.9 mmol/L (ref 3.5–5.1)
SGOT(AST): 29 U/L (ref 15–37)
SGPT (ALT): 31 U/L (ref 12–78)
Sodium: 141 mmol/L (ref 136–145)
Total Protein: 8.3 g/dL — ABNORMAL HIGH (ref 6.4–8.2)

## 2014-01-06 LAB — SALICYLATE LEVEL: SALICYLATES, SERUM: 5 mg/dL — AB

## 2014-01-06 LAB — URINALYSIS, COMPLETE
BILIRUBIN, UR: NEGATIVE
Bacteria: NONE SEEN
GLUCOSE, UR: NEGATIVE mg/dL (ref 0–75)
KETONE: NEGATIVE
Leukocyte Esterase: NEGATIVE
Nitrite: NEGATIVE
Ph: 6 (ref 4.5–8.0)
Protein: NEGATIVE
RBC,UR: 3 /HPF (ref 0–5)
Specific Gravity: 1.01 (ref 1.003–1.030)
Squamous Epithelial: 4
WBC UR: 2 /HPF (ref 0–5)

## 2014-01-06 LAB — ACETAMINOPHEN LEVEL: Acetaminophen: <2 ug/mL

## 2014-01-06 LAB — ETHANOL: Ethanol: <3 mg/dL

## 2014-01-07 ENCOUNTER — Inpatient Hospital Stay: Payer: Self-pay | Admitting: Psychiatry

## 2014-11-23 ENCOUNTER — Emergency Department: Payer: Self-pay

## 2014-11-23 ENCOUNTER — Emergency Department: Payer: Self-pay | Admitting: Internal Medicine

## 2014-11-23 LAB — COMPREHENSIVE METABOLIC PANEL
ALK PHOS: 87 U/L
ALT: 18 U/L
AST: 20 U/L
Albumin: 4.8 g/dL
Anion Gap: 9 (ref 7–16)
BUN: 13 mg/dL
Bilirubin,Total: 0.5 mg/dL
Calcium, Total: 10.1 mg/dL
Chloride: 103 mmol/L
Co2: 29 mmol/L
Creatinine: 0.75 mg/dL
EGFR (Non-African Amer.): >60
Glucose: 128 mg/dL — ABNORMAL HIGH
Potassium: 4.2 mmol/L
SODIUM: 141 mmol/L
Total Protein: 8.9 g/dL — ABNORMAL HIGH

## 2014-11-23 LAB — URINALYSIS, COMPLETE
Bilirubin,UR: NEGATIVE
Glucose,UR: NEGATIVE mg/dL (ref 0–75)
Leukocyte Esterase: NEGATIVE
NITRITE: NEGATIVE
PH: 6 (ref 4.5–8.0)
Specific Gravity: 1.029 (ref 1.003–1.030)
Squamous Epithelial: 11

## 2014-11-23 LAB — CBC WITH DIFFERENTIAL/PLATELET
Basophil #: 0.1 10*3/uL (ref 0.0–0.1)
Basophil %: 0.4 %
EOS PCT: 0.1 %
Eosinophil #: 0 10*3/uL (ref 0.0–0.7)
HCT: 52.6 % — ABNORMAL HIGH (ref 35.0–47.0)
HGB: 17 g/dL — ABNORMAL HIGH (ref 12.0–16.0)
Lymphocyte #: 1.1 10*3/uL (ref 1.0–3.6)
Lymphocyte %: 8.2 %
MCH: 28.6 pg (ref 26.0–34.0)
MCHC: 32.3 g/dL (ref 32.0–36.0)
MCV: 88 fL (ref 80–100)
MONOS PCT: 2.5 %
Monocyte #: 0.3 x10 3/mm (ref 0.2–0.9)
NEUTROS PCT: 88.8 %
Neutrophil #: 12.1 10*3/uL — ABNORMAL HIGH (ref 1.4–6.5)
PLATELETS: 245 10*3/uL (ref 150–440)
RBC: 5.94 10*6/uL — ABNORMAL HIGH (ref 3.80–5.20)
RDW: 14.5 % (ref 11.5–14.5)
WBC: 13.7 10*3/uL — AB (ref 3.6–11.0)

## 2014-11-23 LAB — LIPASE, BLOOD: Lipase: 23 U/L

## 2014-12-18 NOTE — Discharge Summary (Signed)
PATIENT NAME:  Maria Hodge, Maria Hodge MR#:  [id number] DATE OF BIRTH:  09/23/1960  DATE OF ADMISSION:  05/27/2012 DATE OF DISCHARGE:  06/03/2012  HOSPITAL COURSE: See dictated history and physical for details of admission. This 54 year old woman with a history of recurrent severe depression rule out bipolar disorder and substance abuse with chronic pain presented to the hospital emergency room on 05/27/2012 reporting that she was having severe depression and also having thoughts of wanting to kill her sister-in-law and kill herself and having auditory hallucinations. The patient described increased stress in her life related to the impending death of her mother-in-law and the patient feeling that her husband's family generally do not treat her well. Her depression has been getting worse recently. She has been thinking about killing herself by slitting her wrist. Also had intrusive thoughts of wanting to kill her sister-in-law against whom she is particularly angry. The patient had been off of her Abilify recently. She also has been using marijuana again more regularly. She also had been feeling stressed because her chronic pain medication is being tapered. In the hospital, the patient did not actually engage in any dangerous or aggressive behavior. She was more depressed than usual and for a longer period of time. It took several days for her to start to come back to her baseline. She remained somewhat irritable and had hostile thoughts to other patients on several occasions. I put her back on the Abilify she had been taking before and also started Navane another antipsychotic for mood stabilization and treatment of her auditory hallucinations. The patient was engaged in groups and given daily supportive and educational, individual, and group therapy. She gradually showed improvement and came back to her baseline. Her insight improved. By the time she was ready to be discharged she was denying any suicidal ideation. Her  affect was brighter. She showed better insight. She was able to articulate positive things in her life and things she was looking forward to. She felt better able to be assertive with her family and deal with the stress she was having. She was understanding that she would continue to get pain treatment through the pain clinic and was not increased on any of her narcotics. No prescription was given for her standing narcotics at the time of discharge. She will follow up with me in my outpatient clinic. I gave her some samples of the Abilify as well as her prescriptions at the time of discharge and I think I have a follow-up appointment with her on Monday.   LABORATORY RESULTS: Admission labs showed a white count elevated at 12.5, otherwise normal CBC. Glucose elevated at 110. Total protein 8.3. Alcohol undetected. TSH normal. Urinalysis unremarkable. Drug screen positive for opiates, cannabis, and benzodiazepines. Salicylates slightly high at 3.7. Lipid panel was done and showed the only abnormality an increased LDL at 135, but her total cholesterol was 195, triglycerides 64, and HDL 47.   DISCHARGE MEDICATIONS:  1. Claritin 10 mg per day.  2. Navane 2 mg three times daily. 3. Roxicodone 10 mg every 8 hours p.r.n. for pain.  4. Zofran 4 mg every six hours p.r.n. for nausea.  5. MS Contin 15 mg twice a day.  6. Zestril 20 mg per day.  7. Nexium 40 mg per day.  8. Klonopin 1 mg at bedtime. 9. Citalopram 40 mg per day.  10. Abilify 15 mg per day. 11. Amitriptyline 75 mg at night.   MENTAL STATUS EXAM AT DISCHARGE: Casually dressed, reasonably well  groomed woman who looks her stated age. Good eye contact. Normal psychomotor activity. No tremor. Speech was normal in rate, tone, and volume. Affect reactive, euthymic and upbeat. Mood stated as being much better. Thoughts are lucid with no evidence of loosening of associations. Denies auditory or visual hallucinations. No sign of delusions. Totally denies any  suicidal or homicidal ideation. Shows better insight and is able to articulate appropriate coping skills. Baseline intelligence average. Fund of knowledge normal. Insight and judgment good. Memory intact.   DISPOSITION: Discharge home with her family and follow-up with me for psychiatric treatment. We have also made an appointment for her to see Felecia Janina Thompson in the office in [facility] for therapy. She will follow up with Dr. Laban EmperorNaveira in the [facility].  DIAGNOSIS PRINCIPLE AND PRIMARY:   AXIS I: Major depression, severe, recurrent.   SECONDARY DIAGNOSES:   AXIS I:  1. Opiate dependence.  2. Marijuana abuse.   AXIS II: Deferred.   AXIS III: Chronic pain, gastric reflux symptoms, and high blood pressure.   AXIS IV: Severe from poverty, lack of resources, conflict with the family, and chronic illness.   AXIS V: Functioning at time of discharge 56.  ____________________________ Audery AmelJohn T. Clapacs, MD [email or web address] D: 06/03/2012 16:43:34 ET T: 06/03/2012 17:04:58 ET JOB#: [id number]  cc: Audery AmelJohn T. Clapacs, MD, <Dictator> Audery AmelJOHN T CLAPACS MD ELECTRONICALLY SIGNED 06/04/2012 1:09

## 2014-12-18 NOTE — H&P (Signed)
PATIENT NAME:  Maria Hodge, AHMED MR#:  [id number] DATE OF BIRTH:  03-Dec-1960  DATE OF ADMISSION:  04/02/2012  INITIAL ASSESSMENT AND PSYCHIATRIC EVALUATION  IDENTIFYING INFORMATION: Patient is a 54 year old white female not employed and is married and husband is 59 years old and they live together in a house. Patient was discharged on 02/25/2012 after being stabilized with medications and she went home and she comes back for readmission with a chief complaint "I am depressed. I had suicidal thoughts and I need help."   HISTORY OF PRESENT ILLNESS: Patient was discharged on 02/25/2012 after being stabilized for her depressive episode on following medications: Morphine extended release 30 mg q.12 hours, Nexium 40 mg per day, Celexa 40 mg per day, Abilify 15 mg per day, amitriptyline 75 mg at bedtime, Roxicet 5 mg 2 of them q.6 hours p.r.n. for pain with 120 supplied for a month. Patient reports that she has been compliant with her medications and taking medications as prescribed. Patient started feeling depressed because her mother-in-law is 81 years old and to whom patient is very close recently had to go to hospice care. In addition she started having arguments with her niece who is 57 years old about the bridal shower that niece's daughter is going to have and these arguments escalated to the extent that patient became very depressed and broke down and started crying and became suicidal and needed help.   PAST PSYCHIATRIC HISTORY: Patient had many inpatient hospitalizations in psychiatry at [facility] and other places. She gets decompensated on minimal stress because of her mental illness and in addition when she runs out of her medications she gets upset and comes for readmission and help. Tried to kill herself twice; once with a gun and once with a knife but did not hurt herself. Being followed by Dr. Toni Amend on outpatient basis. Last appointment was on 03/29/2012 and next appointment 05/09/2012 and she keeps up  her follow-up appointments.   FAMILY HISTORY OF MENTAL ILLNESS: Half-sister who had depression. No known history of suicides in the family. According to the chart there is history of depression and anxiety in the family.   FAMILY HISTORY: Raised by parents. Father was a Visual merchandiser. Father died when patient was 58 years old. Mother was a housewife. Mother died at age 17 years. Had four sisters and three of them died. Was close to family.   PERSONAL HISTORY: Born in [place]. Moved to [place] at age 30 years. Graduated from high school. No college.  WORK HISTORY: First job was working at [employer] at age 57 years. Worked for a [employer] for 8 years; got tired of job and quit. Last worked August 2008 when she hurt her back and not worked since then.   ALCOHOL AND DRUGS: No problems with alcohol drinking. Has an occasional drink of alcohol. According to the information obtained from the chart, patient does use narcotics for pain. She has overused her prescription medications not the extent of becoming intoxicated.   PAST MEDICAL HISTORY:  1. High blood pressure and is on medications for the same. 2. No known history of diabetes mellitus.  3. Has chronic low back pain.  4. Status post three back surgeries and the last one was for fusion.  5. Gastroesophageal reflux disease and is on medications for the same.   PRIMARY CARE PHYSICIAN: Plans to get a follow up with Dr. Juanetta Gosling in [place], [place].   PHYSICAL EXAMINATION: VITAL SIGNS: Temperature 98.4, pulse 96 per minute regular, respirations  20 per minute regular, blood pressure 136/90 mmHg.   HEENT: Head is normocephalic, atraumatic. Pupils are equal, round, and reactive to light and accommodation. Fundi bilaterally benign. Extraocular movements visualized. Tympanic membrane visualized, no exudates.   NECK: Supple without any organomegaly, lymphadenopathy, thyromegaly.  CHEST: Normal expansion. Normal breath sounds heard.    HEART: Normal S1, S2 without any murmurs or gallops.  ABDOMEN: Soft, nontender. Normal bowel sounds heard.  RECTAL/PELVIC: Deferred.  NEUROLOGICAL: Gait is normal. Romberg is negative. Cranial nerves II through XII grossly intact. DTRs 2+ and normal. Plantars are normal response.   MENTAL STATUS EXAM: Patient is dressed in street clothes. Alert and oriented to place, person and time. Fully aware of situation brought her for admission to [facility]. Affect is flat with mood depressed about her mother-in-law going into hospice and arguments with her niece and fallout with her. Admits feeling hopeless and helpless and worthless and useless. Did admit to suicidal thoughts when she came in but contracts for safety. No evidence of psychosis. Denies any auditory or visual hallucinations. Denies any thought insertion, thought control. Thought process is logical and goal directed. Memory is intact. General knowledge and information is fair for level of education. Cognition is intact. Recall and memory is fair and good. Insight and judgment are guarded.   IMPRESSION: AXIS I:  1. Major depressive disorder, recurrent, severe with suicidal ideas.  2. Nicotine dependence.  3. History of THC abuse according to the chart.  4. 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  5. History of opioid abuse because of chronic pain.   AXIS II: Deferred.  AXIS III:  1. Chronic back pain.  2. Hypertension.  3. Gastroesophageal reflux disease.  4. Status post three back surgeries and last one was fusion.   AXIS IV: Moderate to severe stress from family related problems as stated above that is mother-in-law going to hospice care and arguments with niece.   AXIS V: Global Assessment of Functioning 30.  PLAN: Patient is admitted to [facility] for close observation, evaluation and help. She will be started back on all of her medications which will be adjusted as needed to control her depression.  During the stay in the hospital she will be given milieu therapy and supportive counseling. She will take part in individual and group therapy where coping skills and dealing with people around and family members will be addressed. Family counseling may be requested if needed. Patient will have enough coping skills and she will not be depressed and will have follow-up appointment at the time of discharge.   ____________________________ Jannet MantisSurya K. Guss Bundehalla, MD skc:cms D: 04/02/2012 18:11:22 ET T: 04/03/2012 09:51:49 ET JOB#: [id number]  cc: Monika SalkSurya K. Guss Bundehalla, MD, <Dictator>  Beau FannySURYA K Dougles Kimmey MD ELECTRONICALLY SIGNED 04/03/2012 15:59

## 2014-12-18 NOTE — H&P (Signed)
PATIENT NAME:  Maria Hodge, WITTMEYER MR#:  [id number] DATE OF BIRTH:  Oct 02, 1960  DATE OF ADMISSION:  05/27/2012  IDENTIFYING INFORMATION AND CHIEF COMPLAINT: This is a 54 year old woman with a history of recurrent depression and opiate dependence who came to the emergency room stating she was having thoughts of wanting to kill her sister-in-law or kill herself and having auditory hallucinations.   CHIEF COMPLAINT: "The voices are telling me to kill her."   HISTORY OF PRESENT ILLNESS: Information obtained from the patient and the chart. She came into the emergency room voluntarily saying that recently her depression has been getting increasingly worse. She feels depressed, irritable, and angry all the time. Feels overwhelmed by her life and unable to deal with stress. Major stresses include financial difficulties, difficulty with her relationship with her husband, and difficulty with her husband's sister-in-law whom she portrays as being particularly cruel and difficult to get along with. The patient reports symptoms including poor sleep at night, lack of interest in normal activities, auditory hallucinations (which is a fairly new symptom for her), and feelings that she wants to kill her sister-in-law or kill herself. Specifically, she had thoughts about getting in a bathtub and cutting her wrists. She told me initially that she had been taking all of her medicine but then admitted that she had been off of her Abilify for at least a week because she was not able to afford it. She admits that she uses marijuana occasionally although downplays the frequency of it. She continues to get prescription opiates. She has been to the pain clinic, and the doctor there has apparently decreased her dose down to 15 mg of MS Contin twice a day and had been recommending that she get joint injections. The patient is very upset because she does not want to get joint injections.   PAST PSYCHIATRIC HISTORY: She has had multiple  admissions to the hospital with fairly similar symptoms, although in the past she rarely complains of auditory hallucinations. In the past she usually tends to decompensate when she runs out of her narcotic pain medicines and has tended to get much better as soon as she gets in the hospital and gets back on her narcotics pain medicine. This time it appears that she has still been taking them. Her stress looks like it has gotten overwhelming for her. Diagnosis has been major depression versus bipolar disorder. She does have a past history of threatening suicide and making particularly dramatic threats including of shooting herself in the head. Has overdosed in the past.   PAST MEDICAL HISTORY: The patient has chronic pain of not entirely clear etiology, probably at least in part fibromyalgia. She has been maintained on narcotic pain medicines for years. She recently has started being seen at the pain clinic and is still being prescribed narcotics, although the doses seem to have been decreased. She has also a history of high blood pressure and gastric reflux symptoms.   SOCIAL HISTORY: The patient lives with her husband. They are both on disability. Apparently she is not able to qualify for Medicaid. Their financial state is constantly precarious. They seem to have a fairly chaotic emotional life at home.   FAMILY HISTORY: She has 1 or 2 family members who had severe mood and anxiety disorders and one relative who committed suicide.   CURRENT MEDICATIONS:  1. MS Contin 15 mg twice a day.  2. Oxycodone 5 mg 1 to 4 times a day as needed for pain.  3. Amitriptyline  75 mg at night.  4. Celexa 40 mg per day.  5. Abilify 15 mg per day.  6. Nexium 40 mg per day.  7. Clonazepam 1 mg at night.  8. Lisinopril 20 mg per day.   ALLERGIES: She reports having allergies to aspirin, codeine, hydrocodone, penicillin, and Vicodin.   REVIEW OF SYSTEMS: Complains of severe pain all over her body. Depressed mood.  Crying. Poor sleep. Poor energy. Auditory hallucinations telling her to kill her sister-in-law or kill herself.   MENTAL STATUS EXAM: Disheveled woman, looks in moderate distress. Interviewed in the emergency room. Alert and oriented x4. Eye contact good. Psychomotor activity fidgety. Speech was normal in rate, tone, and volume. Affect was tearful, crying very dramatic. Mood was stated as depressed. Thoughts somewhat concrete, not grossly bizarre. She did endorse having auditory hallucinations. Did not appear to be responding to internal stimuli. Stated she was having thoughts of killing her sister-in-law or killing herself. The patient's intelligence is average. Judgment and insight impaired. Short and long term memory show occasional gaps, possibly from effort.   PHYSICAL EXAMINATION:   GENERAL: The patient is showing full range of motion at all extremities. Normal gait.   HEENT: Pupils are equal and reactive. Face is symmetric. Oral mucosa dry.   NECK: Nontender.   NEUROLOGICAL: Cranial nerves symmetric and normal. Strength and reflexes normal and symmetric upper and lower.   LUNGS: Clear to auscultation with no wheezes.   HEART: Regular rate and rhythm.   ABDOMEN: Soft, nontender, normal bowel sounds.   VITAL SIGNS: Temperature 96.5, pulse 109, respirations 18, blood pressure 132/71. No acute skin lesions.   ASSESSMENT: This is a 54 year old woman with recurrent depression, poor coping skills, poor social situation, high expressed emotion situation, and opiate dependency with recent decreases in her opiate dose. She presents with dramatic symptoms of mood and suicidal and homicidal ideation. Requires hospitalization for stabilization.   TREATMENT PLAN: [facility]. Continue current medications including the Abilify which she has been off. Supportive and educational therapy done. Engage patient in groups and activities on the unit. Daily monitoring of vital signs. Monitoring  of labs. Social Work will be involved and work with her on appropriate discharge planning.   DIAGNOSIS PRINCIPLE AND PRIMARY:  AXIS I: Depression, not otherwise specified.   SECONDARY DIAGNOSES:  AXIS I: Opiate dependence.  AXIS II: Deferred.        AXIS III: Chronic pain, high blood pressure.  AXIS IV: Severe from poor social support, chaotic emotional environment at home, running out of her medicine.  AXIS V: Functioning at time of admission 30.      ____________________________ Audery AmelJohn T. Clapacs, MD jtc:vtd D: 05/27/2012 18:08:44 ET T: 05/28/2012 06:48:29 ET JOB#: [id number]  cc: Audery AmelJohn T. Clapacs, MD, <Dictator> Audery AmelJOHN T CLAPACS MD ELECTRONICALLY SIGNED 05/29/2012 11:27

## 2014-12-18 NOTE — Consult Note (Signed)
Brief Consult Note: Diagnosis: serotonin sx.   Patient was seen by consultant.   Consult note dictated.   Orders entered.   Comments: 54 yo female with h/o depression and suicide ideation. admitted for control of her s/s and stabilization. Pt has been c/o mild agitation, feeling jittery and anxious, with nausea/ vomiting and diarrhea. pt also has been feeling flushed ( with occasional patchy decoloration around her mouth) and sweaty. she has chronic tremor that got a bit worse for the past two days.  Pt has HTN  increased temp flushing acathisia clonus expontaneous and induced. occular clonus.  meets criteria for serotonin sx.  a/p: will add labetalol for control of her HTN ( cont clonidine prn and lisinopril as it is )  stop labetalol once BP is improved cont benzos to decrease BP/HR and anxiety add more periactin. it can be used every 2 hrs if necesary until s/s improved, i think on her case will do q8hr. ssri's have long half life for what some of the symptoms can persist for the next couple of days. thanks for this consult, please call me with questions.  Electronic Signatures: Berlinda LastSanchez Gutierrez, Regan Rakersoberto (MD)  (Signed [phone]-Nov-13 14:21)  Authored: Brief Consult Note   Last Updated: 07-Nov-13 14:21 by Berlinda LastSanchez Gutierrez, Regan Rakersoberto (MD)

## 2014-12-18 NOTE — Discharge Summary (Signed)
PATIENT NAME:  Maria Hodge, Enola G MR#:  [id number] DATE OF BIRTH:  1961/05/28  DATE OF ADMISSION:  04/02/2012 DATE OF DISCHARGE:  04/05/2012  HOSPITAL COURSE: See dictated history and physical for details of admission. This is a 54 year old woman with a history of recurrent depression and chronic pain disorder who came into the Emergency Room reporting suicidal ideation related to worsening depression, also clearly related to the fact that she was running out of her narcotic pain medicine. In the hospital she was continued on her standing dose of narcotic as well as her citalopram. She was engaged in groups and activities. The patient did not show any suicidal behavior. She did not show any signs of psychosis. She attended groups appropriately. She told me that she was feeling better and not having any suicidal ideation. She was able to acknowledge with me that it was clear that her suicidal ideation was triggered by panic when she was afraid she was going to run out of her narcotic pain medicine. To some extent there is a conscious manipulation in this but to some extent she also becomes very panicky. I re-emphasized to her as before that there is an absolute need for her to have an adequate outpatient pain plan in place. My understanding with her had been that she would be seeing Dr. Laban EmperorNaveira in the Pain Clinic and that he would be able to start her pain medicine coverage beginning next week. Today she tells me that she does not think that they will start the pain medicine at the first appointment but that she will not be able to get any pain medicine from the Pain Clinic until the beginning of September. She appears to be sincere in saying this although I feel certain it is different than what she had told me before. I have attempted to call the Pain Clinic to get some confirmation one way or another about this but at this time they have not paged me back yet. I am concerned about the patient's chronic physiologic  dependence and psychological dependence on opiates. I am concerned that she comes into the hospital reporting suicidal ideation whenever she is running out of her narcotics and then rapidly improves once she gets back on them. I've expressed this to her over and over again. At this point we have not been planning to discontinue her opiates here in the hospital and if she is truly going to follow-up with the Pain Clinic this seems counterproductive. I would like to give her a discharge prescription for the smallest amount of narcotic necessary to carry her over to the Pain Clinic and have told her so. I will go ahead and prepare a month prescription but if I hear that they will be able to start her on medication treatment sooner I will cut back on that. For now she continues to be on her citalopram and her lisinopril. She is not acutely suicidal and not psychotic. She will continue to follow-up with me for outpatient mental health treatment.   DISPOSITION: Discharge patient home. Follow-up with me and with the Pain Clinic.   LABORATORY DATA: Urinalysis unremarkable. Drug screen positive for opiates and benzodiazepines. TSH normal. CBC unremarkable. Alcohol undetectable. Chemistry unremarkable.   DISCHARGE MEDICATIONS:  1. Lisinopril 20 mg per day.  2. Clonazepam 1 mg at night.  3. Morphine extended-release 30 mg twice a day.  4. Nexium 40 mg per day.  5. Celexa 40 mg per day.  6. Abilify 15 mg per day. 7.  Amitriptyline 75 mg at night.   MENTAL STATUS EXAMINATION: Casually dressed, reasonably well groomed woman. Appropriately interactive and cooperative with the interview. Good eye contact. Normal psychomotor activity. Somewhat blunted affect but some reactivity. About baseline for her. Not tearful. Mood stated as better. Denied suicidal ideation or homicidal ideation. Denies any psychotic symptoms. Does not show any signs of loosening of associations or delusions. Insight and judgment about her  baseline, reasonably adequate. Short and long-term memory intact. Intelligence average. Alert and oriented x4.   DIAGNOSES PRINCIPLE AND PRIMARY:  AXIS I: Depression, not otherwise specified.   SECONDARY DIAGNOSES:  AXIS I: Deferred.   AXIS II: Deferred.   AXIS III: Chronic pain.   AXIS IV: Ongoing stress from difficulty with her family.   AXIS V: Functioning at time of discharge 55.   ____________________________ Audery Amel, MD jtc:drc D: 04/05/2012 12:13:20 ET T: 04/06/2012 10:42:22 ET JOB#: [id number] Audery Amel MD ELECTRONICALLY SIGNED 04/06/2012 17:15

## 2014-12-18 NOTE — Consult Note (Signed)
PATIENT NAME:  Maria Hodge, Maria Hodge MR#:  [id number] DATE OF BIRTH:  [birth year]  DATE OF CONSULTATION:  07/07/2012  REFERRING PHYSICIAN:  Dr. Mordecai Rasmussen  CONSULTING PHYSICIAN:  Felipa Furnace, MD  REASON FOR CONSULTATION: Medical management of possible serotonin syndrome.   REASON FOR ADMISSION: Depression with suicide ideation.   HISTORY OF PRESENT ILLNESS: Maria Hodge is a very nice 54 year old female who has history of severe depression. In the past she has been hospitalized due to the same problem and she has had prior suicidal gestures and cutting behavior. She also has significant chronic pain for which she takes narcotic medications chronically. In the past she has had hallucinations and she has been put on antipsychotic medications. She comes to the ER saying that she feels tired and she wants to die. Overall the patient has been doing okay but she has been overwhelmed due to the fact that there is a lot of issues going into her family. She and her husband are not coping well with certain situations especially the fact that her husband's mom died several weeks ago. Patient says that she has been taking all her medications. She denies having any new medications prescribed to her; no antibiotics, no other antipsychotics or depression medications other than the ones that she has been taking currently. The patient was admitted and we were consulted due to possible serotonin syndrome. Finally the patient has multiple complaints for the past 48 hours. She feels really hot and flushed on her skin, especially from her neck up, having significant nausea and vomiting and diarrhea that has been going on for at least two days. She has been feeling very jittery, agitated and very stiff. She does feel stiff all the time due to the fact that she has back pain.   REVIEW OF SYSTEMS: GENERAL: She denies any significant weight loss or weight gain. She denies any significant changes in her pain. HEENT: denies  any visual changes. Denies any visual hallucinations. No eye pain. No significant tinnitus or earache. She feels neck getting really hot but no significant neck pain. She denies any double vision or blurry vision. CARDIOVASCULAR: patient has increased high blood pressure and increased heart rate and she feels occasional her heart racing and she feels agitated.  LUNGS: Denies any significant cough, wheezing, or hemoptysis. ABDOMEN: Denies any abdominal pain. GASTROINTESTINAL: Denies any difficulty swallowing. Positive nausea and vomiting and diarrhea as mentioned above. No melena. No hematochezia. GENITOURINARY: Denies any significant increase on urinary frequency. HEMATOLOGIC/LYMPHATIC: Denies any significant easy bruising or bleeding. ENDOCRINE: Denies any significant cold or heat intolerance in the past up to the last 48 hours where she feels really warm. Denies any thyroid problems. Denies any polyuria, polydipsia, polyphagia. Never been diagnosed with diabetes. She does have hypertension. MUSCULOSKELETAL: Positive for tremors. Patient states that tremors are chronic. Patient has occasional muscle twitching on her lower extremities special. PSYCHIATRY: Positive for depression as mentioned above. NEUROLOGIC: Patient denies any significant dizziness. No ataxia. No decreased sensation in any part of her body and she states that she has normal strength. 12 review of systems is done and all negative except for mentioned above.   PAST MEDICAL HISTORY:  1. Chronic back pain.  2. Hypertension.  3. Gastroesophageal reflux disease.  4. Depression.  5. Osteoarthritis.  6. History of asthma, reactive airway disease.  7. History of chronic anemia.  8. Seasonal allergies.  9. Narcotic use on prescribed medications.   ALLERGIES: Patient is allergic to aspirin, codeine, hydrocodone,  penicillin and Vicodin.   SOCIAL HISTORY: Patient is disabled, lives with her husband. She smokes marijuana on multiple occasions.  She denies any tobacco use or excessive alcohol use. She has history of abuse and overuse of narcotics in the past.   PAST SURGICAL HISTORY: Positive for three surgeries on her back. She denies any other surgical procedures.   FAMILY HISTORY: Positive for Parkinson disease in her mother. Positive of significant cardiovascular disease with coronary artery disease and heart attacks in her sister, mother, father and grandparents. History of high blood pressure multiple members of her family and history of cancer but she cannot tell which type of cancer runs in her family.   CURRENT MEDICATIONS ON ADMISSION:  1. OxyContin 5 mg 1 to 2 about 4 times a day. 2. MS Contin 50 mg once daily.  3. Clonazepam 1 mg every night.  4. Claritin 10 mg per day. 5. Novocaine 2 mg 3 times a day. 6. Zofran 1 tablet every six hours. 7. Amitriptyline 75 mg at bedtime. 8. Celexa 40 mg once daily.  9. Abilify 50 mg once daily.  10. Nexium 40 mg once daily.  11. Lisinopril 20 mg once daily.   PHYSICAL EXAMINATION:  VITAL SIGNS: Blood pressure ranging in between 150s to 180s, the last blood pressure is 113/70 just right after patient received one dose of clonidine. Her heart rate is ranging between 90 to 112. Her temperature is arranging also around 98 to 99.8 Fahrenheit.   GENERAL: Patient is alert, oriented x3. There is no significant acute distress. She looks very passive at this moment. She states that early she was very jittery and feeling like she needed to move around but right now she is feeling a lot better. The patient feels warm to touch and she has a little bit of flushing on the top of her neck. No significant acute distress or respiratory distress at the moment. Patient is very pleasant and very cooperative.   HEENT: Her pupils are oval shape and they are located on the left upper lateral quadrant of her iris. They have been looking like this since she was born she states. Her pupils are slightly dilated  with 7 mm but they are reactive to light and accommodation. Her extraocular movements are intact and I can see any ocular clonus on the neurologic examination. her sclerae are anicteric and her conjunctivae are pink. Her mucosa are moist. There is no oral lesions but there is some frothy thick white film on top of her tongue which patient states is because she just ate and hasn't brushed her teeth. No oral lesions. No oropharyngeal exudates.   NECK: Supple. No JVD. No thyromegaly. No adenopathy. No carotid bruits. No masses on palpation. Range of motion is overall normal. Trachea central.   CARDIOVASCULAR: Regular rate and rhythm. No murmurs, rubs, or gallops. No displacement of PMI. No thrills to palpation.   LUNGS: Clear without any wheezing or crepitus. No use of accessory muscles. There is no dullness to percussion.   ABDOMEN: Soft, nontender, nondistended. No hepatosplenomegaly. No masses. Bowel sounds are positive. No hepatic stigmata.   GENITAL: Deferred.   EXTREMITIES: No edema, no cyanosis, no clubbing. Pulses +2. I cannot see any fasciculations on muscles at this moment.   NEUROLOGIC: Cranial nerves II through XII are intact. Positive cogwheeling on upper and lower extremities. Mild rigidity but easy to extend all the extremities. Positive clonus at the level of the ankle and her reflexes are hyperreflexic, +3 to  4. Strength is overall within normal limits, 5/5 symmetrically in four extremities.   LYMPHATIC: Negative for lymphadenopathy.   SKIN: Without any rashes, just flushing at the level of the neck and face.   MUSCULOSKELETAL: Significant back pain to palpation of spinal processes. No joint deformity or joint effusions.   PSYCH: Mood at this moment seems to be normal. patient looks relaxed and I do not see any significant agitation. No signs of akathisia.   LABORATORY, DIAGNOSTIC, AND RADIOLOGICAL DATA: Blood sugar around 100 to 115, sodium 134 today, creatinine is normal at  0.85. Electrolytes within normal limits. LFTs within normal limits. Total CK 50, TSH 1.7. UDS positive for benzodiazepine, cannabinoids,  opiates and tricyclics. White count was mildly elevated at admission 14,000, now is 11,000. Hemoglobin 15.1. Urinalysis within normal limits without signs of infection. Tylenol and salicylate levels are negative. Chest x-ray shows some patchy density along the right heart border that represents mostly fat pad. There is signs of minimal infiltrate and atelectasis but after CT of the chest was done to evaluate this showed that it is just most scarring tissue and that is positive fat pad around the cardiophrenic angle. There is a stable 2 mm diameter subpleural calcified nodule. No lymphadenopathies.   ASSESSMENT AND PLAN:  1. Significant depression with suicidal ideation. Patient was admitted for this issue. She is being treated by Dr. Toni Amendlapacs. Patient is actually starting to feel a little bit better and her mood is improving. All her medications for depression have been stopped.  2. 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 She has slight tremor and hyperreflexia and hypertonia with increased temperature. Serotonin sx is a sx that depends on the ammounts of serotonin and comes on different degrees, she is not as sick as to be admitted on the CCU, but requires close monitoring, at this moment it seems she is getting over it.The patient has never had this happen before. She is off all her medications which is the main treatment. I think this is just a mild case and this case she is not needing any supportive treatment. I think that she should stay on benzodiazepines as scheduled, that way we can decrease her anxiety, decrease her rate and decrease her blood pressure. As far as the temperature management Tylenol won't be indicated due to the  fact that this is not a hypothalamic process. Control of the blood pressure needs to be done with short acting agents. I agree with clonidine. I am going to add on labetalol orally since this could be titrated to keep her blood pressure under better control. The patient is currently using also lisinopril which is her home medication, we will continue. As long her serotonin syndrome resolves probably she won't need to stay on the labetalol so we just have to give holding parameters for the labetalol to be held if systolic blood pressure is starting to be under normal limits and her heart rate starting to come down as well. As far antidotes for this, usually cyproheptadine will be a good option. The patient is already taking one single dose of cyproheptadine 8 mg. That medication can be given up to every two hours and severe manifestations of syndrome though this is a mild case I think she can stay on it three times a day, just watch for sedative effects. As far as her diarrhea, nausea and vomiting it has improved, and  for the most nausea and vomiting have resolved. She still has some residual diarrhea. If this persists we also could consider to test for other abnormalities like bacterial infection. If it is a viral infection it should improve also within the next several days. At this moment it is mostly a matter of time to let the medications come out of her system. SSRIs usually have long half lives. Patient has been taken off of them for less than 48 hours for what I wouldn't expect resolution in the next couple of days although I would expect some improvement. If there is any changes in her condition will be happy to help with her care especially if the patient becomes critical which at this moment I do not think will be the case. As far as differential diagnosis there is always neurolytic malignant syndrome and malignant  hyperthermia but patient doesn't meet criteria for this. We also have to take in  consideration that any type of serotonin secreting syndrome like a carcinoid tumor and we can check histamine levels if that is necessary. As far as her tremor and cogwheeling, patient has family history of Parkinson's. If there is necessary we can do an evaluation with an MRI but that can be done as an outpatient if the tremor persists.   Other than that I will be happy to cooperate on the treatment of this very nice patient. We are going to call Dr. Toni Amend to discuss the case as well.   TIME SPENT: I spent about 55 minutes with this patient.  ____________________________ Felipa Furnace, MD rsg:cms D: 07/07/2012 14:10:02 ET T: 07/07/2012 14:48:16 ET  JOB#: [id number] cc: Felipa Furnace, MD, <Dictator> Judiann Celia Juanda Chance MD ELECTRONICALLY SIGNED 07/13/2012 12:48

## 2014-12-18 NOTE — Discharge Summary (Signed)
PATIENT NAME:  Maria Hodge, Maria Hodge MR#:  [id number] DATE OF BIRTH:  18-Apr-1961  DATE OF ADMISSION:  07/05/2012 DATE OF DISCHARGE:  07/12/2012  HOSPITAL COURSE: See dictated history and physical for details of admission. This 54 year old woman with a history of recurrent suicide attempts and depression was admitted to the hospital voicing suicidal ideation and threatening to cut her wrists. She indicated that she had once again begun to feel extremely depressed and had conflict with her family. Initially she was very dysphoric and anxious. She indicated that she had not overdosed on any of her medicine or been taking any extra medicine or misusing anything. On the first hospital day she began to show symptoms of physical distress. She became markedly flushed especially all over her face and chest with blotchy red patches that became confluent. Her blood pressure became very elevated with diastolics going up over 100. She did not become truly hypertensive but temperature went up to around 99. She was feeling achy all over and had several episodes of vomiting. She had cogwheel rigidity to exam. Patient's syndrome appeared to be most consistent with serotonin syndrome. She was discontinued from all psychiatric medications that could be contributing and put on some standing benzodiazepines. Consultation was obtained with the internal medicine service who assisted in the patient being started on Periactin as well as antihypertensive medications. She did not become fully pyretic and did not become delirious fortunately and so did not require transfer to the medical service. After some episodes of vomiting she was able to begin taking p.o. well. It took her several days but she recovered from most of her symptoms. Prior to discharge a trial was made of restarting her on a low dose of citalopram. Unfortunately as soon as this was done her symptoms of serotonin syndrome returned. By the time of discharge she had once again  recovered from the serotonin syndrome but was not being treated with any significant psychiatric medicine other than the benzodiazepines that were being used to control her symptoms. Patient was asking for discharge. Her mood had improved and she denied suicidal ideation. She was smiling more and interacting more appropriately with peers. She did not show any signs of psychosis. We inquired of patient if she would allow a family meeting to have her husband come into the hospital to discuss her situation. Patient indicated that she had spoken to her husband and that he would not be coming in for a family meeting. She was not forthcoming in allowing the social worker to speak with her husband. Patient did not appear to require further inpatient treatment particularly given that she has outpatient psychiatric and therapy management arrange within the next week. Patient was counseled about these symptoms of depression and the importance of returning for treatment if suicidal ideation should arise or depression become out of control and she agreed to all of this. Periactin and benzodiazepines were discontinued prior to discharge.   DISCHARGE MEDICATIONS:  1. Lisinopril 20 mg per day.  2. Labetalol 100 mg q.12 hours.    LABORATORY, DIAGNOSTIC AND RADIOLOGICAL DATA: Admission labs showed salicylates undetectable and acetaminophen undetectable. Glucose elevated at 115 in a nonfasting draw. The rest of the chemistry panel normal. Alcohol undetectable. White count elevated at 14.4. TSH normal at 1.7. Drug screen positive for tricyclics, opiates, cannabis, and benzodiazepines. Urinalysis unremarkable. Chest x-ray showed some patchy density in the right heart border. CT scan was done as a follow up which did not appear to show any problematic mass.  Follow-up CBC showed the white count to have improved to 11.9. White count stayed down in the 11 range for the rest of the hospital stay. Chemistries remain normal. Total CK  done on the 7th was only 50. No sign of rhabdomyolysis. Blood culture was negative.   DISCHARGE MENTAL STATUS EXAMINATION: Casually dressed and groomed woman, looks her stated age. Cooperative with the exam. Good eye contact. Normal psychomotor activity. Speech is quiet and decreased in total amount but lucid and fluent. Affect smiling and reactive. Mood stated as being good. Thoughts appear lucid with no loosening of associations. No evidence of delusions or delirium. Denies auditory or visual hallucinations. Denies suicidal or homicidal ideation. Shows improved insight and judgment, average intelligence, intact short term memory function.   DISPOSITION: She is discharged home but with plans that she will follow up with me for outpatient psychiatric treatment and Maria Hodge for outpatient therapy within the next week.   DIAGNOSIS PRINCIPLE AND PRIMARY:  AXIS I: Major depressive episode, severe, recurrent.   SECONDARY DIAGNOSES:  AXIS I: Marijuana abuse.   AXIS II: Dependent and borderline features.   AXIS III:  1. Chronic pain. 2. Serotonin syndrome. 3. Hypertension, possibly directly related to the serotonin syndrome.   AXIS IV: Severe from chronic conflict with her family.   AXIS V: Functioning at time of discharge 51.   ____________________________ Audery AmelJohn T. Arieon Scalzo, MD jtc:cms D: 07/12/2012 22:24:17 ET T: 07/13/2012 11:58:45 ET JOB#: [id number]  cc: Audery AmelJohn T. Kamyrah Feeser, MD, <Dictator>  Audery AmelJOHN T Deagan Sevin MD ELECTRONICALLY SIGNED 07/14/2012 10:17

## 2014-12-18 NOTE — H&P (Signed)
PATIENT NAME:  Maria Hodge, Maria Hodge MR#:  [id number] DATE OF BIRTH:  December 25, 1960  DATE OF ADMISSION:  07/05/2012  IDENTIFYING INFORMATION AND CHIEF COMPLAINT: This is a 54 year old woman who came into the Emergency Room voluntarily stating that she was suicidal.   CHIEF COMPLAINT: "I'm tired and I would just as soon die now."   HISTORY OF PRESENT ILLNESS: Information obtained from the patient and the chart. This patient who is well known to me from multiple prior admissions and outpatient clinic presented voluntarily to the Emergency Room stating she was having active suicidal ideation. She states that she was trying to cut her wrist with a razor earlier today but her husband prevented her from doing so. She reports feeling overwhelmed and extremely stressed with active suicidal thoughts, feels hopeless about her situation. Has been sleeping poorly. Cries frequently. All of this has been going on for an undetermined amount of time. Patient is not the best historian at this time. She evidently has been getting worse for the last several days although it is not clear if there has been a specific recent upset to her life. Her husband's mother died several weeks ago but I have seen the patient since then and she had previously seemed to be coping well. Patient claims that she has been compliant with all of her medicines and claims that she has not overdosed on any of them. She tells me several times that she has not taken any excessive doses of any of her medicine and that she has not been prescribed any new medication. She admitted when confronted with her drug screen that she continues to smoke marijuana fairly regularly. She is seeing Dr. Laban Emperor in the pain clinic and he is gradually tapering down her narcotic pain medicine. She blames a lot of her depression on her relationship with her husband who she says is never supportive of her.   PAST PSYCHIATRIC HISTORY: Patient had had several psychiatric  hospitalizations just within the last year. Most of them have presented in a similar way with suicidal threats and depressive symptoms. Often they are linked to running out of her narcotic pain medicine, although they also have been linked with a problem she is having at home with her husband and extended family. She has had some cutting behavior in the past, has had some overdoses in the past. She has threatened to kill herself many times. She has in the past at times of reporting hallucinations although they are atypical. She has been treated with several different antidepressants and antipsychotics. Overall she had seemed to do a little bit better when she was on both an antidepressant and an antipsychotic although she really has not maintained much stability throughout the whole last year.   SOCIAL HISTORY: Patient is disabled. Lives with her husband who is also disabled. She has little transportation. Most of her social life revolves around her husband's extended family.   PAST MEDICAL HISTORY: Patient has chronic back pain. She also has a history of hypertension and gastric reflux symptoms.   SUBSTANCE ABUSE HISTORY: History of abuse and overuse of her narcotic pain medicine. History of recurrent abuse of marijuana even after multiple discussions of how it is probably contributing to her instability of her mood.   CURRENT MEDICATIONS:  1. Oxycodone 5 mg 1 to 2 tablets 1 to 4 times a day.  2. MS Contin 15 mg once a day.  3. Clonazepam 1 mg at night.  4. Claritin 10 mg per day.  5. Navane 2 mg 3 times a day.  6. Zofran 1 tablet every six hours as needed for nausea.  7. Amitriptyline 75 mg at bedtime.  8. Celexa 40 mg once a day.  9. Abilify 15 mg once a day.  10. Nexium 40 mg once a day. 11. Lisinopril 20 mg per day.   ALLERGIES: Aspirin, codeine, hydrocodone, penicillin, Vicodin.   REVIEW OF SYSTEMS: Patient complains acutely of feeling hot and feeling like her skin is heating up. Also  complains that for the last day she has been having nausea and vomiting. Also has been feeling jittery and agitated. All of these symptoms just started this afternoon. Apparently she had not been complaining of them in the Emergency Room. Emotionally she is complaining of feeling anxious and depressed and having suicidal ideation, not complaining of any acute psychotic symptoms.   MENTAL STATUS EXAM: Disheveled woman, looks even more disheveled and in distress than she usually does. She is easily arousable. Alert and oriented during the interview. Made good eye contact. Psychomotor activity a little bit fidgety but not extremely so. Speech was normal in rate, tone, and volume. Affect was anxious, tearful, depressed. Mood was stated as depressed. Thoughts are overall lucid without gross disorganization or evidence of delusions. Denies auditory or visual hallucinations. Endorses suicidal ideation but without any intent to hurt herself in the hospital. Denies homicidal ideation. Intelligence about average. Judgment and insight chronically showing some impairment especially when she is upset. Short and long-term memory appear to be generally intact.   PHYSICAL EXAMINATION:  GENERAL: Patient looks like she is not feeling well. Her skin is bright red and flushed over her face and chest and her lips appear somewhat swollen.   EYES: Her pupils are equal and reactive.   NECK: Mobile and nontender.   BACK: Nontender.   MUSCULOSKELETAL: Full range of motion at extremities. Normal gait. On examination, however, of her upper extremities I find clear cogwheeling rigidity evident. Strength is normal and symmetric throughout.   LUNGS: Clear with no wheezes.   HEART: Regular rate and rhythm, possibly slightly tachycardic.  LUNGS: Respirations normal.   VITAL SIGNS: Vital signs most recently show a temperature of 99.2, pulse 80, respirations 20, last recorded blood pressure 154/91.   LABORATORY, DIAGNOSTIC AND  RADIOLOGICAL DATA: The drug screen is positive for opiates, cannabis, and benzodiazepines, also tricyclic antidepressants. TSH normal at 1.7. Alcohol undetectable. Chemistry show a slightly elevated glucose at 115. Hematology panel does show an elevated white count at 14.4. Urinalysis is unremarkable. The salicylates and acetaminophen are nondetected.   ASSESSMENT: This is a 54 year old woman who in terms of her emotional state is presenting in a manner typical to what she has many times in the past. Acute decompensation into depression with suicidal ideation and a feeling of being overwhelmed and hopeless. Unclear what the acute precipitant would be for this since she does seem to have been still taking her narcotics as prescribed. Probably is related to her chronically difficult relationship with her husband. Physically I am concerned about a constellation of symptoms that she is presenting acutely including remarkable flushing of her face and chest, cogwheel rigidity, slightly elevated temperature and blood pressure, nausea and vomiting. It is possible that this is a virus but I am concerned that she may be having mild symptoms of either neuroleptic malignant syndrome or possibly serotonin syndrome. I questioned her several times as to whether she had overdosed on any of her medicine or taken excessive doses and  she swears that she has not. At this point she appears to be hemodynamically stable and not showing signs of delirium. Hopefully we will be able to manage this appropriately on the ward.   TREATMENT PLAN: At this time I have discontinued all of her antipsychotics and all of her antidepressants including amitriptyline, Cymbalta, the Navane and the Abilify as well as discontinuing Zofran because of its potentially as a serotonergic medicine. Patient will be given standing doses of benzodiazepines and Benadryl and Tylenol at this time. I have discussed the case with the on duty nurse's for the night and  she will have her vital signs closely monitored overnight. I am going to recheck labs in the morning. Once she has physically stabilized we can readdress proper medication treatment.   DIAGNOSIS PRINCIPLE AND PRIMARY:  AXIS I: Major depression, severe, recurrent.   SECONDARY DIAGNOSES:  AXIS I:  1. Cannabis abuse.  2. Opiate abuse.   AXIS II: Dependent and borderline features.   AXIS III:  1. Hypertension.  2. Chronic pain. 3. Rule out serotonin syndrome.   AXIS IV: Moderate to severe from chronically poor relationship with her husband and impaired financial resources.   AXIS V: Functioning at time of evaluation 30. ____________________________ Audery Amel, MD jtc:cms D: 07/05/2012 21:54:08 ET T: 07/06/2012 06:20:39 ET  JOB#: [id number] Audery Amel MD ELECTRONICALLY SIGNED 07/06/2012 10:05

## 2014-12-21 NOTE — H&P (Signed)
PATIENT NAME:  Maria Hodge, Maria Hodge MR#:  [id number] DATE OF BIRTH:  [birth year]  AGE:  54 years  SEX:  Female  RACE:  White  DATE OF ADMISSION:  01/20/2013  DATE OF DICTATION:  01/21/2013  PLACE OF DICTATION[place]  [facility] [place]  INITIAL ASSESSMENT/PSYCHIATRIC EVALUATION  IDENTIFYING INFORMATION:  The patient is a 54 year old white female, not employed, is on disability for back problems. She is married for the third time and lives with her husband, who is 53 years old, and he is on disability. The patient comes for inpatient psychiatry at [facility].  CHIEF COMPLAINT:  "I started getting depressed since Mother's Day. My son, who is 53 years old, is on his father's side, and he said he will come and spend 2 to 3 hours with me on Mother's Day, and he did not show up. Since then, I started feeling hopeless and helpless, and started having suicidal thoughts and could not stop crying, so my husband brought me here."  HISTORY OF PRESENT ILLNESS: The patient reports that she has a long history of depression, has been stabilized on medications, but since Mother's Day she started decompensating and started feeling very depressed and started having suicidal thoughts, though no active plans, and came here for help.  PAST PSYCHIATRIC HISTORY:  The patient had 6 or 7 inpatient psychiatry visits so far. Last time the patient saw psychiatry was in November 2013. Being followed on outpatient basis by Dr. Toni Amendlapacs, and last appointment was in April 2014.  Next appointment is in June. The patient is stabilized on the following medications, that is:  Seroquel 100 mg at bedtime and amitriptyline 80 mg at bedtime, which she has been taking as prescribed. In addition, she is on labetalol 100 mg daily for blood pressure, and she quit taking it because she did not feel like taking it. In addition, she is on oxycodone 5 mg 3 times a day, which she takes for back problems, and has been continuing to  take the same.  The patient did have suicidal thoughts, but never had attempts so far.   FAMILY HISTORY OF MENTAL ILLNESS:  Mother and sister are both depressed.  Sister had attempted suicide.  FAMILY HISTORY:  Very big family. She graduated from high school.  No college.   PERSONAL HISTORY:  Born in [place], moved to [place] many years ago. Longest job she has worked was at [employer], and this job lasted for 10 years.  Last worked at [employer], and had back problems and had to quit working many years ago and go on disability. Military history:  None.  Marriage:  Was married 3 times, currently she is married for the third time. Has one son, who is 53 years old, and is on father's side and did not show up for Mother's Day, which caused her to have depression. Alcohol and drugs:  Denies drinking alcohol, denies street or prescription drug abuse.. Does smoke nicotine cigarettes at the rate of  pack a day for many years. According to information obtained from the chart, the patient did abuse THC and opiates in the past, but quit using them for quite some time.  MEDICAL HISTORY:  Has high blood pressure. No known history of diabetes mellitus. Status post 3 back surgeries, and is in chronic pain. No history of motor vehicle accident or being unconscious.  ALLERGIES:  ASPIRIN, CODEINE, HYDROCODONE, PENICILLIN and VICODIN.  PHYSICAL EXAMINATION: VITAL SIGNS:  Temperature 98.8, pulse  is 96 per minute and regular, respirations 20 per minute and regular, blood pressure 130/80 mmHg. HEENT:  Head is normocephalic and atraumatic.  Eyes:  PERRLA.  Fundi bilaterally benign.  EOMs intact. Tympanic membranes:  No exudate. NECK: Supple, without any organomegaly, lymphadenopathy or thyromegaly. CHEST:  Normal expansion, normal breath sounds heard. HEART:  Normal S1, S2, without any murmurs or gallops. ABDOMEN:  Soft. No organomegaly. Bowel sounds heard. RECTAL AND PELVIC:  Deferred. SKIN:   Scars from surgery on the back are healed well. NEUROLOGIC:  Gait is normal. Romberg is intact. Cranial nerves II to XII are grossly intact. DTRs 2+ and normal.  Plantars are normal response.     MENTAL STATUS EXAMINATION:  The patient is dressed in street clothes. Alert and oriented to place, person and time. Affect is flat, with mood depressed. Teary-eyed talking about relationship with her son, who did not show up for Mother's Day as promised. Does have suicidal thoughts and wishes, but no active plans. Can contract for safety. No psychosis. Denies auditory or visual hallucinations. Denied paranoid ideas. Cognition is intact. General knowledge of information intact. Could spell the word WORLD forward and backward. Insight and judgment guarded.  IMPRESSION:  AXIS I:   1.  Major depressive disorder, recurrent.  2.  Alcohol and nicotine dependence.  3.  Cannabis abuse and opiate abuse, in remission.  AXIS II:  Dependent and borderline features.  AXIS III:   1.  Hypertension. 2.  Chronic pain secondary to back surgery, status post 3 back surgeries.  AXIS IV:  Moderate to severe from chronic depression and relationship problems with son and family members.  AXIS V:  GAF 30.  PLAN:  Patient admitted to [facility] for close observation and management. She will be started back on her medications. During the stay in the hospital, she will be given milieu therapy and supportive counseling.  She will take part in individual and group therapy. Her coping skills will be addressed. At the time of discharge, the patient will be stable and will be able to deal with stressors of life, and followup appointment will be made in the community.  ____________________________ Jannet Mantis. Guss Bunde, MD skc:mr D: 01/21/2013 17:48:00 ET T: 01/21/2013 18:39:11 ET JOB#: [id number]  cc: Monika Salk K. Guss Bunde, MD, <Dictator> Beau Fanny MD ELECTRONICALLY SIGNED 01/22/2013 14:16

## 2014-12-21 NOTE — Discharge Summary (Signed)
PATIENT NAME:  Maria Hodge, [name] MR#:  [id number] DATE OF BIRTH:  Jul 31, 1961  DATE OF ADMISSION:  01/20/2013 DATE OF DISCHARGE:  01/25/2013  HOSPITAL COURSE:  See dictated history and physical for details of admission.  A 54 year old woman with a history of depression was admitted to the hospital reporting suicidal ideation.  She was reporting that she had decompensated and become more depressed as an outpatient.  She had not actually attempted to harm herself.  In the hospital she was treated with her usual psychiatric medication.  She was also treated with daily individual and group psychotherapy.  She participated in therapy appropriately.  The patient showed gradual improvement in her affect and mood during the time she was in the hospital.  She was denying any suicidal ideation at the time of discharge.  She still felt a little bit run down, but was agreeing to outpatient treatment and showed good insight and judgment.  She was able to name several positive things in her life that she was looking forward to and agreed to consider again looking into psychotherapy as well as following up with the pain clinic.   DISCHARGE MEDICATIONS:  Seroquel 100 mg at night, oxycodone 5 mg 1 tablet 1 to 3 times a day as needed, labetalol 100 mg once a day, Nexium 40 mg once a day, amitriptyline 80 mg at night.   LABORATORY RESULTS:  Admission labs included a drug screen positive for opiates, cannabis and benzodiazepines.  TSH normal at 1.5.  Alcohol undetected.  Chemistry just showed a slightly elevated total protein at 8.7.  No significant abnormalities.  CBC showed an elevated hematocrit at 48.4, elevated hemoglobin 16.1.  Urinalysis positive for blood, most likely menstrual and contaminated.   MENTAL STATUS EXAMINATION AT DISCHARGE:  Casually dressed, neatly groomed woman, looks her stated age, agreeable to the interview.  Good eye contact, normal psychomotor activity.  Speech is normal in rate, tone and volume.   Affect euthymic, reactive, appropriate.  Mood stated as okay.  Thoughts are lucid.  No evidence of loosening of associations or delusional thinking.  Denies auditory or visual hallucinations.  Denies suicidal or homicidal ideation.  Shows improved judgment and insight.  Shows a good understanding of her illness and is able to make appropriate outpatient informed decisions.   DISPOSITION:  Discharge home.  Follow up with Dr. Toni Amendlapacs for outpatient medication management as well as with the pain clinic and consider seeing a psychotherapist.   DIAGNOSIS, PRINCIPAL AND PRIMARY:  AXIS I:  Major depression, recurrent, moderate to severe.   SECONDARY DIAGNOSES: AXIS I:   1.  Cannabis abuse.  2.  History of benzodiazepine and opiate abuse.  AXIS II:  Deferred.  AXIS III:  Chronic back pain.  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  AXIS IV:  Moderate to severe from what seems like a chronically unsupportive social situation.  AXIS V:  Functioning at time of discharge 55.     ____________________________ Audery AmelJohn T. Jonerik Sliker, MD jtc:ea D: 02/05/2013 13:41:25 ET T: 02/05/2013 20:41:02 ET JOB#: [id number]  cc: Audery AmelJohn T. Emmalou Hunger, MD, <Dictator> Audery AmelJOHN T Jouri Threat MD ELECTRONICALLY SIGNED 02/18/2013 12:10

## 2014-12-22 NOTE — Discharge Summary (Signed)
PATIENT NAME:  Maria Hodge, [name] MR#:  [id number] DATE OF BIRTH:  [birth year]  DATE OF ADMISSION:  01/07/2014 DATE OF DISCHARGE:  01/08/2014  HOSPITAL COURSE: See dictated history and physical for details of admission. A 54 year old woman came into the hospital stating that she was having suicidal ideation, that she was depressed, that she was feeling overwhelmed. She has been cooperative with treatment since being in the hospital. She has admitted that she was abusing benzodiazepines off the street, as well as continuing to use marijuana. She says that she was just feeling overwhelmed by her usual problems in her relationship with her husband as well as by the stress of Mother's Day. This is actually pretty consistent with the sorts of things that have pushed her to the edge in the past. She did not actually try to kill herself, but came into the hospital on her own. Since being here, she has not shown any suicidal behavior in the hospital. She has cooperated with medication management and therapy. On interview today, the patient presents a mental status essentially at her best baseline. She has outpatient treatment in place already seeing me in my clinic and knows that she can follow up any time if she needs to. She was counseled about the importance of getting off of the abusable drugs, taking only what prescribed. She agrees to do this. She was also counseled about the importance of stress management in her life and agrees to continue working on that. At this point there is no likely benefit to further hospitalization and she will be discharged home.   LABORATORY RESULTS: Admission labs: Alcohol negative. Chemistry panel: No significant abnormalities. Drug screen positive for benzodiazepines, cannabis, and opiates and tricyclic antidepressants. CBC: Not remarkable. Urinalysis: Positive blood not infected.   MENTAL STATUS AT DISCHARGE: Casually dressed, adequately groomed woman, looks her stated age,  cooperative with the interview. Eye contact good. Psychomotor activity normal. Speech normal rate, tone, and volume. Affect euthymic, reactive, appropriate. Mood stated as being better. Thoughts are lucid without loosening of associations or delusions. Denies auditory or visual hallucinations. Denies suicidal or homicidal ideation. Shows somewhat improved judgment and insight. Short and long-term memory grossly intact. Normal fund of knowledge.   DISCHARGE MEDICATIONS: Meloxicam 7.5 mg once a day, amitriptyline 75 mg at night, quetiapine 200 mg twice a day, Nexium 40 mg a day.   DISPOSITION: Discharge home. Follow up with me in my outpatient office as already scheduled or call sooner if needed.   DIAGNOSIS, PRINCIPAL AND PRIMARY:   AXIS I: Depression, not otherwise specified.   SECONDARY DIAGNOSES:  AXIS I: Polysubstance abuse.   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   AXIS III: Chronic pain.   AXIS IV: Moderate to severe from the chaos in her family and home relationship and limited financial resources.   AXIS V: Functioning at time of discharge 55.    ____________________________ Audery AmelJohn T. Zayon Trulson, MD jtc:lt D: 01/08/2014 17:00:54 ET T: 01/09/2014 05:38:36 ET JOB#: [id number]  cc: Audery AmelJohn T. Michaeleen Down, MD, <Dictator> Audery AmelJOHN T Johnson Arizola MD ELECTRONICALLY SIGNED 01/12/2014 15:34

## 2014-12-22 NOTE — H&P (Signed)
PATIENT NAME:  Maria Hodge, Maria Hodge MR#:  [id number] DATE OF BIRTH:  [birth year]  DATE OF ADMISSION:  01/07/2014  PLACE OF DICTATION: [facility], [place], [place]  SEX: Female.  RACE: White.  AGE: 54 years.  INITIAL ASSESSMENT AND PSYCHIATRY EVALUATION:  IDENTIFYING INFORMATION: The patient is a 54 year old white female who is not employed and has been on disability for many years for back problems and pain. The patient ia married for a third time, lives with her husband. The patient comes to [facility]  for inpt admission at [facility] in [place], [place] after being discharged on 01/25/2013 after being stabilized by Dr. Toni Amend for her depression.  CHIEF COMPLAINT: "I have been having problems with depression  and overwhelmed with financial stressors and having to take my husband to [facility] for meningitis and followup and having to stay in the hotel and take care of the family".  HISTORY OF PRESENT ILLNESS: The patient reports that she has been overwhelmed as stated above and started decompensating and wanting to end it all and having suicidal ideations and thought and came here for help.  PAST PSYCHIATRIC HISTORY: The patient had several inpatient hospitalizations in psychiatry so far and the last seen patient in psychiatry was in July of 2014 for a similar episode when she got overwhelmed and got depressed. No history of suicide attempt, though she has suicidal ideations and thought. Being followed by Dr. Toni Amend on outpatient basis and has appointment coming up with him very soon.  FAMILY HISTORY: Mental illness, mother and sister are both depressed. Sister attempted suicide. Personal History. Graduated from high school. No college.. Born in [place], moved to [place] many years ago. 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 Last worked at [employer] and had back problems and it not  able to stand long enough time to work and went on disability.  MEDICAL HISTORY: None.   Marriags Married 3 times. Currently married for 3rd time. Has one son who is 86 years old and he is on father's side.   ALCOHOL AND DRUG: Denies drinking alcohol, denies street  or drug abuse. Does admit smoking cigs  at a rate of half pack a day for many years. According to information obtained from the chart, the patient did abuse THC and opiates in the past, which the patient agrees, but denies at current time. Does have a benzodiazepine abuse problem.   Medical -history ,status post 3 back surgeries and she is in chronic back. No history of motor accident or being unconscious.  ALLERGIES: ASPIRIN, CODEINE, HYDROCODONE, PENICILLIN AND VICODIN.   Being followed by Dr. Dan Humphreys at [facility]. Last appointment in December 2014. Next appointment is coming up soon.  PHYSICAL EXAMINATION:  VITAL SIGNS: Temperature 98.2, pulse is 86 per minute regular, respirations 20 per minute regular, blood pressure is 126/78 mmhg. HEENT: The head is normocephalic, atraumatic. Eyes: PERRLA, fundi are benign.   tympanic membranes visualized, no exudate. NECK: Supple without any  thyromegaly.  CHEST: Normal expansion, normal breath sounds heard. HEART: Normal heart sounds without any murmurs or rubs. ABDOMEN: Soft, no organomegaly, bowel sounds heard. NEUROLOGICAL: Gait is normal. Romberg is negative. Cranial nerves II through XII grossly intact DTRs 2+ normal. Plantars are normal.   MENTAL STATUS EXAMINATION: The patient is dressed in street clothes. Alert and oriented to place, person, and time. Affect is flat. Mood depressed. Started  but talking about  being overwhelmed with finances and having to take care of her husband with very little help from her son, but no psychosis, denies auditory hallucinations or paranoid thinking. General information and knowledge is fair  for level of education. Could spell the word  "world" forward and backward. Could count money. She knew capital of [place]  the name of current president. Denies any suicidal ideas or, denies any suicidal plans at this time, though she has suicidal wishes of  at times. Insight and judgement guarded.  IMPRESSION:  AXIS 1: Major depressive disorder -recurrent with suicidal wishes. Contracts for safety, alcohol and nicotine and nicotine dependence chronic continous, history of Cannibis abuse,   benzodiazepine abuse in remission. Marland Kitchen. 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 AXIS III: Hypertension, chronic pain secondary to status post 3 back surgeries. AXIS IV: Severe chronic depression, being overburdened with financial stressors of family problems. AXIS V: Global assessment of functioning:  25.  PLAN: The patient is admitted to  [facility] for a closer obswervation eval adn help.Marland Kitchen. She will be started back on antidepressive medications, which may be adjusted during her stay in the hospital. She will begin on anti-depressent meds . She will be given mileau  therap snd supportive therapy..Will be stable  at the time of discharge. The patient will be given  followup appointment at time of discharge. ____________________________ Jannet MantisSurya K. Guss Bundehalla, MD skc:sg D: 01/08/2014 01:53:00 ET T: 01/08/2014 06:17:35 ET JOB#: [id number]  cc: Monika SalkSurya K. Guss Bundehalla, MD, <Dictator> Beau FannySURYA K Daeshon Grammatico MD ELECTRONICALLY SIGNED 01/13/2014 18:59

## 2014-12-23 NOTE — H&P (Signed)
PATIENT NAME:  Maria Hodge, Maria Hodge MR#:  [id number] DATE OF BIRTH:  12-15-1960  DATE OF ADMISSION:  01/24/2012  DATE OF EVALUATION: 01/25/2012  IDENTIFYING INFORMATION AND CHIEF COMPLAINT: 54 year old woman with a history of depression who came to the Emergency Room reporting worsening of depression, but with particularly irritable and agitated behavior towards her husband. Allegedly made threats that she was going to kill her husband or assault him and may have made some suicidal statements as well.   CHIEF COMPLAINT: "I don't know what happened."   HISTORY OF PRESENT ILLNESS: The patient reports that recently her mood had been more unstable. Some of the events that specifically led to admission she claims she barely remembers or does not remember at all. She apparently made some threatening statements to her husband and may have made some suicidal statements as well. She reports that she had been feeling more angry and upset. She blames this on the change in her antidepressant. Recently, she had discontinued Cymbalta and switched over to citalopram because of the cost. She tells me that she had not had any change or interruption in her pain medicine, but had been taking her narcotic pain medicine regularly. She denied to me that she had been using other drugs. When confronted with her drug screen, which was positive for benzodiazepines and marijuana, she admits that she had used a little bit of Valium and smoked marijuana within the last week. She minimizes her understanding of how this is related to her mood. The patient tells me today that her mood is feeling fairly stable. She has been feeling a little bit more tired, a little bit more trouble sleeping at night. She denies any psychotic symptoms. Says that her pain is pretty well under control. She says that generally she felt like she had been getting along reasonably well with her husband.   PAST PSYCHIATRIC HISTORY: Long history of depression and also  of being very sensitive to medications. Has chronic pain and in the past has possibly overused pain medicines. Also, in the past when she has run out of her narcotic pain medicine, she has developed overwhelming anxiety and suicidality. I again note that in this case she denies that that is what is going on. She does have a history of overdose and self injury in the past. Has been diagnosed with depression and rule out posttraumatic stress disorder. Also, chronic pain. The patient is followed by me in the outpatient clinic. Had been on a combination of Cymbalta and Abilify and amitriptyline for depression and anxiety.   PAST MEDICAL HISTORY:  1. High blood pressure. 2. Gastric reflux symptoms. 3. Chronic pain related to a diagnosis primarily of fibromyalgia.   SOCIAL HISTORY: Lives with her husband. Both of them apparently disabled. Very limited social activity outside the house. Very little daily constructive activity.   CURRENT MEDICATIONS:  1. Amitriptyline 75 mg at night. 2. Abilify 15 mg per day.  3. Celexa 40 mg per day.  4. Nexium 40 mg per day.  5. Zestril 40 mg per day.  6. Morphine extended-release 60 mg twice a day. 7. Oxycodone immediate release 5 mg every 4 to 6 hours p.r.n. pain.   ALLERGIES: Aspirin, codeine, hydrocodone, penicillin, Vicodin.   REVIEW OF SYSTEMS: Currently, when I interviewed her, the patient reports that her mood is feeling better. Denied suicidal or homicidal ideation. Says her pain is under pretty good control. She feels a little bit fatigued. Not having suicidal thoughts.   MENTAL STATUS  EXAMINATION: Casually dressed, reasonably well groomed woman. Cooperative and pleasant. Good eye contact. Normal psychomotor activity. Speech normal rate, tone, and volume. Affect is euthymic, reactive, smiling. Mood stated as being better. Thoughts are lucid with no evidence of loosening of associations or delusional thinking. No racing thoughts. Denies any acute suicidal or  homicidal ideation. Denies hallucinations. Somewhat impaired judgment and insight particularly regarding substance abuse.   PHYSICAL EXAMINATION:  GENERAL: Woman who looks older than her stated age.   SKIN: No acute skin lesions.   CURRENT VITAL SIGNS: Temperature 98.8, pulse 84, respiration 20, blood pressure 127/87.   MUSCULOSKELETAL: Full range of motion at all extremities. She walks a little bit stiffly as though she were in chronic pain.   HEENT: Pupils equal and reactive. Face symmetric.   NEUROLOGICAL: Cranial nerves all intact and symmetric. Strength is normal and symmetric as are reflexes.   LUNGS: Clear to auscultation with normal breath sounds.   HEART: Regular rate and rhythm.   ABDOMEN: Soft, nontender, normal bowel sounds.   ASSESSMENT: 54 year old woman with chronic recurrent depression and chronic pain. Current situation a little bit hard to understand and she does not have a good understanding for it. Probably the best guess I would have about why she had this was related to her use of marijuana and benzodiazepines. Currently, she seems to have returned to her baseline mental state. On admission, she was evidently reporting some homicidal and suicidal ideation. The patient does have a history of mood swings and irritability and sensitivity to medication. Required admission because of mood instability and potential dangerousness.   TREATMENT PLAN: She was put back on all of her usual medications as noted above when she came into the hospital. I had been wondering whether she had really been taking all of her medications, but I called the pharmacy she goes to and confirmed that she has been getting her narcotics filled by her usual doctor at the usual appropriate times. Worked on counseling her about the abuse of benzodiazepines and marijuana and how it may affect her mood state. Engage her in groups and activities. Get collateral history if possible. Continue to assess mood and  behavior. Work on discharge planning.   LABORATORY, DIAGNOSTIC AND RADIOLOGICAL DATA: Drug screen positive for tricyclics, opiates, cannabis and benzodiazepines. TSH a little bit low at 0.35. Alcohol normal, nondetectable. Total protein a little bit high at 9.1. AST a little bit low at 13. Sodium low at 132. Glucose a little elevated at 120. CBC shows an elevated white count at 14.1. Hematocrit elevated at 48.6. Urinalysis shows protein, positive red blood cells, some white blood cells, trace bacteria. At her age, possibly a urinary tract infection or small stone.   DIAGNOSIS PRINCIPLE AND PRIMARY:  AXIS I: Depression, not otherwise specified.   SECONDARY DIAGNOSES:  AXIS I: Marijuana and benzodiazepine abuse.   AXIS II: Deferred.   AXIS III:  1. Fibromyalgia. 2. High blood pressure. 3. Rule out urinary tract infection.   AXIS IV: Moderate. Chronic stress from burden of illness and poor social support.   AXIS V: Functioning at time of admission 30.   ____________________________ Audery AmelJohn T. Sherrise Liberto, MD jtc:ap D: 01/25/2012 23:06:00 ET T: 01/26/2012 08:45:02 ET JOB#: [id number]  cc: Audery AmelJohn T. Edelmiro Innocent, MD, <Dictator> Audery AmelJOHN T Asako Saliba MD ELECTRONICALLY SIGNED 01/26/2012 17:55

## 2014-12-23 NOTE — Discharge Summary (Signed)
PATIENT NAME:  Maria Hodge, Maria Hodge MR#:  [id number] DATE OF BIRTH:  1961/04/25  DATE OF ADMISSION:  09/10/2011 DATE OF DISCHARGE:  09/14/2011  HOSPITAL COURSE: See dictated history and physical for details of admission, but this 54 year old woman presented to the Emergency Room reporting severe suicidal ideation with thoughts to shoot herself in the head. She had become increasingly depressed. Also had had a recent problem with running out of her pain medicine. In the hospital, she did not display any dangerous or suicidal behavior. She presented as tearful and depressed initially. She also complained quite a bit of her physical pain. Consideration was given to the fact that the patient seems to probably be overusing some of her pain medicine. I worked with her to try and decrease some of the pain medicine that she had been taking. She was started back on Cymbalta 30 mg twice a day for treatment of depression, which she tolerated well. Mood and affect improved significantly during her hospital stay. By the time of discharge she was showing an upbeat affect, totally denying suicidal ideation, agreeing to outpatient treatment in the community. She was counseled about the problems with her chronic heavy use of narcotics and encouraged to continue talking with her doctor about possibly decreasing her total load of narcotic medication.   DISCHARGE MEDICATIONS: 1. Trazodone 100 mg at bedtime as needed for sleep.  2. MS Contin 15 mg every eight hours.  3. Zestril 20 mg per day.  4. Nexium 40 mg per day.  5. Remeron 30 mg at night.  6. Robaxin 750 mg q.8 hours p.r.n. for pain.  7. Cymbalta 30 mg twice a day.  8. Clonidine tablet 0.1 mg q.6 hours as needed for opiate withdrawal.   LABORATORY, DIAGNOSTIC, AND RADIOLOGICAL DATA: Chemistry panel unremarkable. Lipase normal. CBC showed an elevated white count of 14.9. Urinalysis positive for protein and red blood cells, probably having menstrual period. Pregnancy test  negative.   MENTAL STATUS EXAM AT DISCHARGE: Affect is calm, smiling, more upbeat. Mood is stated as being better. Thoughts are lucid and directed. Good eye contact, normal psychomotor activity. Speech normal rate, tone, and volume. Denies suicidal or homicidal ideation. Denies psychotic symptoms. Shows some improved insight and judgment.   DISPOSITION: Discharge back home. Follow-up with primary care doctor and referral to psychiatrist in the community.   DISCHARGE DIAGNOSIS PRINCIPLE AND PRIMARY:   AXIS I: Major depression, recurrent, severe.   SECONDARY DIAGNOSES: AXIS I: Opiate abuse.   AXIS II: Deferred.   AXIS III:  1. Chronic pain.  2. High blood pressure.  3. Gastric reflux symptoms.  AXIS IV: Moderate to severe. Chronic stress from financial difficulties.   AXIS V: Functioning at time of discharge 55.    ____________________________ Audery AmelJohn T. Brittnay Pigman, MD jtc:kma D: 10/02/2011 00:23:42 ET T: 10/03/2011 15:40:41 ET JOB#: [id number]  cc: Audery AmelJohn T. Olusegun Gerstenberger, MD, <Dictator> Audery AmelJOHN T Shenay Torti MD ELECTRONICALLY SIGNED 10/07/2011 10:43

## 2014-12-23 NOTE — Discharge Summary (Signed)
PATIENT NAME:  Maria Hodge, SKOLNICK MR#:  [id number] DATE OF BIRTH:  27-May-1961  DATE OF ADMISSION:  01/24/2012 DATE OF DISCHARGE:  01/27/2012  HOSPITAL COURSE: See dictated history and physical for details. This 54 year old woman came into the hospital because she had allegedly made threatening or homicidal statements towards her husband. While in the hospital, she denied having any suicidal or homicidal ideation. She did not engage in any dangerous or aggressive behavior. She did display anxiety and low mood. Reported that she had been feeling more depressed and she blamed it on the switch that she had made in her antidepressants. She had recently made the switch from Cymbalta to Celexa because she had entered the doughnut hole and was not able to afford the Cymbalta. In the hospital, she was continued on Celexa however and seemed to stabilize fairly quickly. She totally denied having any homicidal ideation. She was cooperative and interactive on the unit. Of note her laboratory test show that she had a positive drug screen for marijuana and benzodiazepines. She is not normally prescribed benzodiazepines. Confronted with this she admitted that she had been using some benzodiazepines she had obtained illicitly as well as smoking marijuana. She showed some improved insight into the way the continued substance abuse was affecting her mood. She reported that she had been using her opiates normally as prescribed. I did contact the pharmacy she uses and confirmed that she had been getting them prescribed on a regular basis. She was continued on her usual opiate pain medicine. She is to follow-up with me in the outpatient clinic. At the time of discharge, she is not having any suicidal or homicidal ideation and no sign of psychotic thinking.   LABORATORY DATA: On admission her drug screen was positive for benzodiazepines, cannabis, and opiates as well as tricyclic antidepressants. TSH low at 0.35. Alcohol undetectable.  Chemistry showed a total protein high at 9.1, AST low at 13, chloride low at 96, and sodium 132. The CBC showed an elevated white count at 14 and elevated hematocrit at 48.6. Urinalysis had multiple red blood cells as well as bacteria in it. I reran some of her tests. Her free thyroxine is 1.0, which is in the normal range. CBC on repeat testing showed a white count normal at 10.9 and a hematocrit normal at 43.8. Urinalysis on repeat was nearly free of blood, no bacteria, fewer white blood cells, doubtful for infection.   DISCHARGE MEDICATIONS:  1. Oxycodone 5 mg p.r.n. up to four times a day for pain.  2. Morphine sulfate 60 mg twice a day.  3. Lisinopril 40 mg per day.  4. Nexium 40 mg per day.  5. Celexa 40 mg a day.  6. Aripiprazole 15 mg a day. 7. Amitriptyline 75 mg at night.   MENTAL STATUS EXAMINATION: Neatly dressed and groomed. Good eye contact. Pleasant and cooperative and normally interactive. Speech normal in rate and tone. Affect is euthymic, smiling, upbeat, no longer anxious. Mood stated as good. Thoughts are lucid and directed with no evidence of loosening of associations or delusions. Denies suicidal or homicidal ideation. Denies paranoia or any other psychotic symptoms. Good insight and judgment.   DIAGNOSIS PRINCIPLE AND PRIMARY:   AXIS I: Depression not otherwise specified.   SECONDARY DIAGNOSES:   AXIS I: Marijuana and benzodiazepine abuse.   AXIS II: Deferred.   AXIS III: Chronic pain, hypertension.   AXIS IV: Severe - chronic stress from lack of social resources, chronic conflict with her husband, chronic boredom.  AXIS V: Functioning at time of discharge is 60.  ____________________________ Audery AmelJohn T. Clapacs, MD jtc:slb D: 01/27/2012 12:41:53 ET     T: 01/27/2012 14:01:31 ET       JOB#: [id number] cc: Audery AmelJohn T. Clapacs, MD, <Dictator> Audery AmelJOHN T CLAPACS MD ELECTRONICALLY SIGNED 01/27/2012 22:21

## 2014-12-23 NOTE — Discharge Summary (Signed)
PATIENT NAME:  Maria Hodge, Maria Hodge MR#:  [id number] DATE OF BIRTH:  Dec 02, 1960  DATE OF ADMISSION:  02/22/2012 DATE OF DISCHARGE:  02/25/2012  HISTORY AND PHYSICAL: See dictated History and Physical for details of admission.   HOSPITAL COURSE: This 54 year old woman presented to the Emergency Room stating suicidal ideation with thoughts of shooting herself in the head. She reported that her anxiety and depression were worse. Not coincidentally, she had recently run out of her narcotic pain medication and did not have an adequate plan in place for refilling it or continuing it. I mentioned this because this seems to be a common factor in her admissions to Inpatient Psychiatry is running out of her pain medicine. In the hospital, she was started back on pain medicine at what is a lower dose than what she was being prescribed as an outpatient and was continued on her psychiatric medicines, which are primarily Celexa and Abilify. The patient did not display any dangerous or aggressive behavior in the hospital. She has been cooperative and participatory and gone to groups. She does seem to be paying attention to information in group therapy and individual therapy and does seem to have some insight and plans for making some changes to improve her coping skills. At the time of discharge, she has returned to her baseline mental state with euthymic affect, upbeat optimistic outlook on the future, total lack of suicidal ideation, no psychotic features. Before discharge, we did have a discussion about her pain medicine. Because I have verified that in the past up till now she had a pain doctor who was continuing her on prescription pain medicine, I have not insisted on discontinuing them in the hospital. However, at this point it seems that her previous doctor is no longer going to be treating her, and she needs to switch to a new pain doctor. We have continued her on pain medicine at this time, and I will discharge her on  what she is taking here and give her a month's supply because she does not yet have an appointment made at the local pain clinic. However, I told her that if she could not establish a consistent and reliable mechanism for continuing with prescription pain medicine, the next time she comes in the hospital we would have to consider detoxification. She understood this and basically agreed with it.   LABORATORY DATA: Admission labs showed a drug screen positive for opiates, cannabis, and tricyclic antidepressants. Alcohol undetectable. Chemistry showed a slightly low sodium at 135, glucose 112, neither of any clinical significance. CBC was normal. Follow-up tests of lipids because she is on an atypical antipsychotic showed a cholesterol of 199, triglycerides 62, HDL 46, VLDL 12, LDL 141, overall a pretty good panel except for the elevated LDL. Urinalysis showed positive white blood cells.  DISCHARGE MEDICATIONS:  1. Morphine Extended Release 30 mg every 12 hours.  2. Nexium 40 mg per day.  3. Citalopram 40 mg per day.  4. Abilify 15 mg per day.  5. Amitriptyline 75 mg at night. 6. Roxicet 5 mg strength, 2 of them every 6 hours p.r.n. for pain with 120 supplied for a month.   MENTAL STATUS EXAM: A neatly dressed and groomed woman who looks her stated age. Cooperative with the interview. Pleasant interaction. Good eye contact, normal psychomotor activity. Speech normal in rate, tone, and volume. Affect slightly anxious but reactive and appropriate. Mood stated as being good. Thoughts appear lucid with no evidence of delusional thinking or loosening of  associations. She denies hallucinations. She totally denies suicidal or homicidal ideation. She is alert and oriented, appears to be of average intelligence with intact memory and shows improved insight and judgment.   DISPOSITION: She is being discharged home and will follow up with me for medication management and has an appointment in a couple of months.  Additionally, arrangements are being made to refer her to a psychotherapist and to set her up with the Pain Clinic.   DIAGNOSIS, PRINCIPAL AND PRIMARY:  AXIS I: Major depressive episode, severe, recurrent.   SECONDARY DIAGNOSES:  AXIS I: Opiate dependence.   AXIS II: Deferred.   AXIS III: Chronic pain, gastric reflux symptoms.   AXIS IV: Moderate-to-severe stress from chronic discord with her husband and family.   AXIS V: Functioning at time of discharge 60.  ____________________________ Audery AmelJohn T. Clapacs, MD jtc:cbb D: 02/25/2012 12:39:25 ET T: 02/26/2012 10:58:52 ET JOB#: [id number] Audery AmelJOHN T CLAPACS MD ELECTRONICALLY SIGNED 02/26/2012 12:33

## 2014-12-23 NOTE — H&P (Signed)
PATIENT NAME:  Maria Hodge, Maria Hodge MR#:  [id number] DATE OF BIRTH:  1961-01-01  DATE OF ADMISSION:  01/112013  IDENTIFYING INFORMATION AND CHIEF COMPLAINT: The patient is a 54 year old woman with a history of recurrent major depression and chronic pain. She came to the Emergency Room stating that she was having suicidal thoughts with the thought of putting a gun in her mouth. She said that she has been feeling more depressed recently. Her history is a little bit inconsistent. At one point she told me that she had been feeling severely depressed ever since she was here last time, which was in October. She clarifies, however, that she was not having active suicidal thoughts during all that time and that she does not always feel the same level of depression. It sounds like she has chronic stress from her relationship with her husband. Her husband sounds like she perceives him as being controlling and demanding, which causes her a lot of grief. In addition, the patient has chronic pain for which she is prescribed chronically large doses of morphine. She reported when she came into the Emergency Room that she had run out of her medicine two days prior to admission. She said that this was only a couple of days early. She was not able to call her primary doctor who prescribes her medicine because she has signed a pain contract with them, and she is afraid that she will be fired from the clinic if she asks for a refill early. I would mention that I have called the pharmacy that the patient normally goes to and discovered that she actually last had her prescription filled on 12/27. This would suggest that it was more than a couple of days early her running out, more like over a couple of weeks early.   PAST PSYCHIATRIC HISTORY: She describes a long history of depression. She has had two prior admissions to our hospital, both of them under almost identical circumstances. She runs out of her pain medicine and then comes in  stating that she is severely depressed and having suicidal thoughts, very dramatically stating that she is thinking of putting a gun in her mouth. On both of the prior occasions, once she was in the hospital and back on her pain medicine she reported that her mood was much better almost immediately. The patient did not go for any follow-up psychiatric treatment when she was discharged from the hospital last time.   SUBSTANCE ABUSE HISTORY: The patient denies that she abuses the opiates, but it is clear that she is overusing her prescription opiates or somehow has run out of them quite early. She denies regular use of alcohol or other drugs. Her drug screen currently, however, is positive for cannabis.   PAST MEDICAL HISTORY: The patient has chronic pain that started with a back injury about five years ago. She then had surgery and that made it worse. She has been treated with chronic narcotics for years. She currently is prescribed MS Contin 60 mg t.i.d. as well as p.r.n. doses of oxycodone. In addition to the chronic pain, she also has high blood pressure and gastric reflux symptoms.   SOCIAL HISTORY: The patient is married. I believe this is her third husband. She is on Disability, just got her Medicare approved. She does not work. It sounds like their home life is pretty constricted. She does not get out much or do very much.   REVIEW OF SYSTEMS: The patient complains of severe pain, primarily in her  back, but also running down her left leg on the outside. She complains of numbness in her left foot and lower leg. She says she feels shaky, somewhat sick, tired a lot of the time. Difficulty sleeping without medication. Depressed mood. She feels hopeless a lot. She has had some suicidal thoughts, otherwise noncontributory.   MENTAL STATUS EXAM: A somewhat disheveled woman who looks her stated age. Cooperative with the interview. Good eye contact. Subdued psychomotor activity. Speech is normal in rate, tone  and volume. Affect is dysphoric and blunted and anxious. Mood is stated as being bad. Thoughts appear to be generally lucid and directed with no obvious loosening of associations or delusional thinking. She denies auditory or visual hallucinations. She endorses suicidal ideation but denies any intention to act on it in the hospital. She denies homicidal ideation. She has somewhat impaired judgment and insight.   PHYSICAL EXAMINATION:  VITAL SIGNS: The current vital signs show a temperature of 98.4, pulse 106, respirations 20, blood pressure 127/93.   SKIN: No acute skin lesions identified. Her skin is flushed over much of her face and head.   HEENT: Pupils are equal and reactive.   NEUROLOGICAL: Cranial nerves are intact and symmetric.   NECK: Supple.   MUSCULOSKELETAL: Some tenderness to palpation over her lower back. She has full range of motion in her arms, but her legs, especially on the left side, she shows decreased range of motion around the hip. She walks with a limp. She favors the left leg. Decreased strength on the left leg especially. Otherwise intact and symmetric in the upper body.   LUNGS: Clear to auscultation with no wheezes.   HEART: Regular rate and rhythm.   ABDOMEN: Soft and nontender.   ASSESSMENT: A 54 year old woman admitted because of suicidal ideation and complaints of multiple depressive symptoms, major stresses at home. It is not entirely clear how her opiate use plays into this. I suspect that the severe depressive symptoms to bring her into the hospital are mainly triggered by running out of her opiates. I did confirm that she actually has been prescribed the narcotics at the doses that she is quoting. She does have chronic pain, and it does seem that it has been thought to be appropriate to treat it with narcotics in the past. She apparently does not abuse the drugs constantly every month. She does not have much insight into how overusing them becomes a problem,  however. It is possible also that there is diversion going on and she is not admitting it. I am torn between forcing her to detox off of opiates because of inappropriate use and restarting some opiates because of her chronic pain, also because she has outpatient treatment in place. I tried to reach her outpatient provider, but the clinic is closed today. She does need hospitalization because of the suicidal ideation, obviously.   TREATMENT PLAN: Review labs. As I mentioned, she has cannabis positive drug screen as well as opiates. Engage her in groups on the unit, which she has already attended. Daily supportive and educational therapy. Work on arranging outpatient treatment and try and encourage her to get involved in appropriate outpatient treatment. I think that I will do what I did last time, which is to put her on a lower dose of the MS Contin and try and minimize or avoid the oxycodone p.r.n. We will see how she is doing over the next couple of days. Hopefully, the suicidal ideation will improve and her mood will improve.  We need to really get her to accept that she needs to go for outpatient psychiatric treatment, including therapy.   DIAGNOSIS, PRINCIPAL AND PRIMARY:  AXIS I: Depression, not otherwise specified.   SECONDARY DIAGNOSES:  AXIS I: Opiate withdrawal, probably.   AXIS II: Deferred.   AXIS III:  1. Chronic pain.  2. Hypertension.  3. Gastric reflux symptoms.   AXIS IV: Moderate-to-severe: Chronic stress from pain as well as from what sounds like a difficult social situation.   AXIS V: Functioning at time of evaluation 30.    ____________________________ Audery AmelJohn T. Kenyetta Wimbish, MD jtc:cbb D: 09/11/2011 15:09:43 ET T: 09/11/2011 15:45:49 ET JOB#: [id number]  cc: Audery AmelJohn T. Danniel Tones, MD, <Dictator> Audery AmelJOHN T Aricela Bertagnolli MD ELECTRONICALLY SIGNED 09/11/2011 17:35

## 2014-12-23 NOTE — H&P (Signed)
PATIENT NAME:  Maria Hodge, Kashay G MR#:  [id number] DATE OF BIRTH:  1960/10/13  DATE OF ADMISSION:  01/24/2012  IDENTIFYING INFORMATION: The patient is a 54 year old white female not employed and has been on disability for back problems since 2008 after her back surgery. The patient is married for the third time for 10 years and lives with her husband. The patient comes back for readmission to [facility] after being driven by her husband to the Emergency Room with a chief complaint "I was in the donut hole and could not afford my Cymbalta anymore. I was paying 30 dollars for my Cymbalta and they changed it to 195 each time and I could not afford it and so my doctor changed it from Cymbalta 30 mg once a day to Celexa 40 mg and Abilify 15 mg and I started decompensating and started to hear things".  HISTORY OF PRESENT ILLNESS: The patient was last discharged from [facility] on 09/14/2011 after being stabilized for her depression on the following medications: 1. Trazodone 100 mg at bedtime. 2. MS Contin 15 mg p.o. q.8 hours for pain.  3. Zestril 20 mg per day. 4. Nexium 40 mg per day. 5. Remeron 30 mg at bedtime.  6. Robaxin 750 mg q.8 hours p.r.n. for pain. 7. Cymbalta 30 mg twice a day. 8. Clonidine 0.1 mg p.o. q.6 hours p.r.n. for opiate withdrawal.   The patient reports that she has been compliant with her medications and they work really well but because of being in the donut hole with her insurance she could not afford it anymore and so her psychiatrist had to change it. Ever since her medications were changed, she has been acting weird. For example, her husband was cooking supper last night on the stove and she took away the supper and started cooking her own supper. She started taking the clean dishes out of the dishwasher and started cleaning them again. In addition, her husband reported that she has been threatening him and he thought it was not safe for her to be at home.  The patient reports that she's never this way and suddenly everything changed and Cymbalta has worked best for her so far of all the anti-depressant medications.   PAST PSYCHIATRIC HISTORY: History of inpatient hospitalization on Psychiatry about three times so far. First inpatient hospitalization on Psychiatry was many years ago, cannot remember the details. Third inpatient hospitalization on Psychiatry was in January of 2013 to [facility] when she tried to kill herself and had thoughts of putting a gun to her mouth. On one occasion she wanted to slash her wrists and took a knife and dog came and pulled the knife away and she says "I need my baby to take care of her mama". She was in the hospital and admitted and her medications were adjusted. Currently she is being followed by Dr. Toni Amendlapacs on an outpatient basis. Last appointment was on 01/07/2012; next appointment 02/04/2012.   FAMILY HISTORY OF MENTAL ILLNESS: One of her sisters has similar problem with depression. She tried to commit suicide but no completed suicides.   FAMILY HISTORY: Raised by parents. Father died of meningitis when the patient was 87six years old and he died at age 53 years. Mother was a homemaker all her life. Mother died at age 53 years with MRSA infection. Had one brother and four sisters. All of them have died except one sister. Sister lives far away. She is very close to niece and  called her "she is my caregiver".   PERSONAL HISTORY: Born in [place]. Moved to [place] at age 68 years. Graduated from high school. No college.   WORK HISTORY: Worked in [employer] all of her life and loved working in it. Last worked in 2008. Her back popped when she lifted a heavy wrought iron stool. Not able to work since then.   MILITARY HISTORY: None.  MARRIAGES: Married three times. First marriage lasted for four years. Cause of divorce "too young". Second marriage lasted for 25 years. Cause of divorce "he was alcoholic  and abusive". Has one son who is 51 years old and not in touch with him. Current marriage is third one and has lasted for 10 years. He is on disability.   ALCOHOL AND DRUGS: First drink of alcohol at age 29 years. Did not like alcohol and never drank alcohol again. Denies street or prescription drug abuse. Smokes cigarettes at a rate of half pack a day for many years.   MEDICAL HISTORY: Has high blood pressure and gastroesophageal reflux disease and is on medication for the same. No known history of diabetes mellitus. Status post back surgery for herniated disk.   ALLERGIES: Allergic to codeine and penicillin.  PRIMARY CARE PHYSICIAN: Not being followed by any primary care physician at this time. Goes to the Emergency Room when needed.  MEDICATIONS: As stated above.   PHYSICAL EXAMINATION:  VITAL SIGNS: Temperature 98.4, pulse 92 per minute and regular, respirations 20 per minute and regular, blood pressure 145/90 mmHg.   HEENT: Head is normocephalic and atraumatic. Pupils equal, round, and reactive to light and accommodation. Fundi bilaterally benign. EOMS visualized. Tympanic membranes no exudates.   NECK: Supple without any organomegaly or thyromegaly.  CHEST: Normal expansion. Normal breath sounds heard.  HEART: Normal S1, S2 without any murmurs or gallops.   ABDOMEN: Soft. No organomegaly. Bowel sounds heard.   BACK: Scar from back surgery healed well.   RECTAL/PELVIC: Deferred.   NEUROLOGIC: Gait is normal. Romberg is negative. Cranial nerves II through XII are grossly intact. Deep tendon reflexes 2+. Plantars are normal response.  MENTAL STATUS EXAMINATION: The patient is dressed in street clothes, alert and oriented to place, person, and time. Fully aware of the situation that brought her for admission to [facility]. Affect is appropriate with her mood which is low and down. She is depressed and feels hopeless and helpless at times. Denies any suicide thoughts or ideas and said "not  now, I'm feeling better and I feel helped". No evidence of psychosis. Denies auditory or visual hallucinations, delusions or paranoid thinking. Denies any thought insertion or thought control. Denies having any grandiose ideas. She could spell the word world forward and backward. Memory and recall are good and could remember all of the three objects at one and three minutes. Abstract interpretation good. Cognition grossly intact. She reports that she was confused and was doing bizarre things and weird things prior to admission but currently she is able to think clearly and better. She already feels that current situation is helping her and she's not as anxious as she was at the time of admission. Denies any ideas to hurt herself or others and wants to get help. Insight and judgment fair.   IMPRESSION: AXIS I: 1. Major depressive disorder, recurrent, severe, with behavior disturbances and confusion. 2. History of opiate abuse. 3. Nicotine dependence.  AXIS II: Deferred.   AXIS III:  1. Status post back surgery for herniated disk and chronic  pain secondary to the same. 2. Hypertension. 3. Gastroesophageal reflux disease.  AXIS IV: Severe. Not able to afford the medication that helps her because insurance will not pay and this only caused her to decompensate; financial difficulties because of inability to pay for the medication that helps her.  AXIS V: GAF 25.  PLAN: The patient is admitted to [facility] for close observation, evaluation, and help. She will be started on all of the medications as stated above. During the stay in the hospital she will be given milieu therapy and supportive counseling. She will take part in individual and group therapy where coping skills and compliance and medication issues will be addressed. At the time of discharge she will not be depressed. Hopefully Social Services will look into a clinic or appropriate program where she can get free medication as  she's not able to afford to buy her current medication that have been helping her. Appropriate follow-up appointment will be made.  ____________________________ Jannet Mantis. Guss Bunde, MD skc:drc D: 01/24/2012 19:26:07 ET T: 01/25/2012 05:59:43 ET JOB#: [id number]  cc: Monika Salk K. Guss Bunde, MD, <Dictator> Beau Fanny MD ELECTRONICALLY SIGNED 01/30/2012 15:10

## 2014-12-23 NOTE — H&P (Signed)
PATIENT NAME:  Maria Hodge, Maria Hodge MR#:  [id number] DATE OF BIRTH:  01/26/1961  DATE OF ADMISSION:  02/22/2012  IDENTIFYING INFORMATION AND CHIEF COMPLAINT: This is a 54 year old woman who came into the Emergency Room reporting suicidal ideation and depression.   CHIEF COMPLAINT: "I ran out of my medicine".   HISTORY OF PRESENT ILLNESS: The patient came to the Emergency Room yesterday reporting that she was having renewed suicidal ideation and feeling depressed, sad, anxious, tearful and out of control. Precipitating events include having run out of her prescription pain medication over the weekend and also getting into a verbal confrontation with her niece. The patient says that yesterday she started having thoughts about putting a gun in her mouth and shooting herself but came to the Emergency Room instead. She has been tearful, agitated, anxious, unable to think clearly for about a day or so. She tells me that she ran out of her pain medicine because her usual supplier of pain medicine has changed his practice and is no longer available to prescribe for her. Apparently she has known this for a month but has not been able to get a new doctor during that time. Otherwise, she is compliant with her usual psychiatric medicine. She has chronic low back pain which is always which is a stress for her. She has chronic low-grade conflict with her husband as well. She usually sees her niece as being a primary support but they had some kind of falling out or argument over the weekend and the patient is distraught over it. Apparently it had to do with the way the patient was treated at a baby shower. The patient is not reporting current psychotic symptoms. She denies that she has been abusing drugs, although she does admit that she took a Valium yesterday when she was very upset.   PAST PSYCHIATRIC HISTORY: The patient has had several hospitalizations at our facility all under rather similar circumstances. When she runs  out of her pain medicine, she comes to the Emergency Room upset and reporting suicidal ideation. Typically once her pain medication is restarted she tends to feel much better. Nevertheless, she does seem to have a chronic problem with depression and mood lability and inadequate coping skills in the face of significant psychosocial stress. She does have a history of serious suicide threats in the past. She does not have a past history of violence. She has responded well to antidepressants including Cymbalta and her current medication, Celexa, combined with Abilify. She does not have a history of a psychotic disorder.   SOCIAL HISTORY: The patient lives with her husband. Both of them are disabled. The patient has minimal ability to get around on her own. She finds her life to often be unstimulating and boring. It sounds like she and her husband end up bickering a lot at home. The patient had felt that her niece was her one relative or friend that she could talk to regularly so she is particularly upset over this argument that they had.   PAST MEDICAL HISTORY: Has chronic low back pain. Has been treated with chronic narcotic medication for a long time. Until recently she did have a confirmed provider who was giving her the narcotics. Also has gastric reflux disease. Takes amitriptyline also to help with the pain. Has high blood pressure and takes Zestril.   FAMILY HISTORY: Positive for depression and anxiety symptoms.   SUBSTANCE ABUSE HISTORY: At times there have been concerns about her overusing her narcotics. Recently because  she has had a provider who was willing to continue treating her I have not insisted that she come off of her narcotic medication. I think if we can't confirm that she has a new provider we should probably consider tapering off of it. She has overused other prescription medicines in the past probably not so much to become intoxicated as because she gets overwhelmed by her symptoms and  loses her judgment.   CURRENT MEDICATIONS:  1. Her outpatient pain medicine was MS Contin 60 mg a day, Roxicet 10 mg q.6 hours.  2. Amitriptyline 75 mg at night. 3. Abilify 15 mg per day. 4. Celexa 40 mg per day. 5. Lisinopril 40 mg per day. 6. Nexium 40 mg per day.   ALLERGIES: Aspirin, codeine, hydrocodone, penicillin, Vicodin. Given the fact that she tolerates a wide variety of pain medicines here, I'm not sure what to make of her allergy list.   REVIEW OF SYSTEMS: To my interview today she complains of still feeling anxious. Tearful at times. Denies acute suicidal intent or plan. Denies homicidal ideation. Denies acute hallucinations. Says that her pain is managed adequately on the medicine that I have prescribed in the hospital. Slept okay last night.   MENTAL STATUS EXAMINATION: Somewhat disheveled, reasonably well groomed woman who looks her stated age or older. Alert and oriented x4. Eye contact normal. Psychomotor activity normal. Speech normal in rate and tone and volume. Affect anxious, tearful at times. Mood stated as being still torn up. Thoughts lucid with no evidence of loosening of associations. Denies paranoia. Denies auditory or visual hallucinations. Denies suicidal or homicidal ideation currently. Recent judgment and insight somewhat impaired. Memory, short-term, appears intact. Average intelligence.   PHYSICAL EXAMINATION:   GENERAL: The patient does not appear to be in any acute distress. She walks with a slight limp which is chronic. She moves slowly which is chronic.   SKIN: No skin lesions identified.   HEENT: Pupils are equal and reactive. Face symmetric.   NECK: Nontender.   EXTREMITIES: Full range of motion at upper extremities. Limited range of motion at her hips, normal at her knees and ankles. Strength and reflexes normal throughout.   NEUROLOGICAL: Cranial nerves intact and symmetric.   LUNGS: Clear to auscultation.   HEART: Regular rate and rhythm.    ABDOMEN: Soft, nontender. Normal bowel sounds.   LABORATORY RESULTS: Notable findings in the Emergency Room included a drug screen that was positive for cannabis, benzodiazepines, opiates, and tricyclics. Ethanol not detected. Chemistry panel showed a slightly low sodium at 135 and significantly high glucose given that it was not a fasting draw at 112. Hematology panel normal.   ASSESSMENT: This is a 54 year old woman with chronic depression, mood lability but no history of true mania. Chronic pain has been dependent on opiates. Predictably having a decompensation off of her pain medicine and after a social conflict at home. Also somewhat predictably is feeling better after less than a day in the hospital. Required hospitalization for suicidal threats and suicidal ideation and inability to cope.   TREATMENT PLAN:  1. Have restarted medications but with her morphine sulfate dose at 30 mg twice a day.  2. Continue antidepressants and Abilify the same as it was.  3. Try to keep her engaged in groups and activities on the unit.  4. Supportive therapy and cognitive behavioral therapy done.  5. Work with her on the need for her to improve her coping skills.  6. Social Work will meet with her.  7. I will continue to see her as an outpatient after discharge.   8. No anticipated acute change in her medicine.  9. We will check up and see if she has a follow-up pain doctor arranged. If not, we will need to address her opiate situation further.   DIAGNOSES PRINCIPLE AND PRIMARY:  AXIS I: Major depression, recurrent, severe.   SECONDARY DIAGNOSES:  AXIS I:  1. Marijuana abuse. 2. Benzodiazepine abuse. 3. History of opiate abuse but on chronic pain medicine.   AXIS II: Deferred.   AXIS III:  1. Chronic back pain. 2. Hypertension. 3. Gastric reflux.   AXIS IV: Moderate to severe. Chronic stress from very low social support, conflict at home.   AXIS V: Functioning at time of evaluation 30.    ____________________________ Audery Amel, MD jtc:drc D: 02/23/2012 11:58:51 ET T: 02/23/2012 12:18:15 ET JOB#: [id number]  cc: Audery Amel, MD, <Dictator> Audery Amel MD ELECTRONICALLY SIGNED 02/24/2012 9:31

## 2015-06-06 ENCOUNTER — Encounter: Payer: Self-pay | Admitting: Psychiatry

## 2015-06-06 ENCOUNTER — Ambulatory Visit (INDEPENDENT_AMBULATORY_CARE_PROVIDER_SITE_OTHER): Payer: 59 | Admitting: Psychiatry

## 2015-06-06 VITALS — BP 124/82 | HR 100 | Temp 98.2°F | Ht 62.5 in | Wt 151.4 lb

## 2015-06-06 DIAGNOSIS — G894 Chronic pain syndrome: Secondary | ICD-10-CM | POA: Diagnosis not present

## 2015-06-06 DIAGNOSIS — F3162 Bipolar disorder, current episode mixed, moderate: Secondary | ICD-10-CM

## 2015-06-06 MED ORDER — AMITRIPTYLINE HCL 75 MG PO TABS: 75.0000 mg | ORAL_TABLET | Freq: Every day | ORAL | Status: DC

## 2015-06-06 MED ORDER — QUETIAPINE FUMARATE 200 MG PO TABS: 200.0000 mg | ORAL_TABLET | Freq: Three times a day (TID) | ORAL | Status: DC

## 2015-06-06 NOTE — Progress Notes (Signed)
[facility] MD Progress Note  06/06/2015 2:38 PM Maria Hodge  MRN:  [id number] Subjective:  Follow-up for this 54 year old woman with a history of mood instability bipolar disorder. Patient has no new complaints at this time. She says that her mood has been good for months. No sign of major depression or agitation. No return of any suicidal thinking. She has continued to stay off of narcotics by her report and I think this is probably helping her to stay more emotionally stable. Relationship with her husband is doing well. She does mention that she has had some more trouble sleeping recently. She also mentions that she drinks coffee when she wakes up at 1 in the morning. I encouraged her to not do that. Principal Problem: @ Diagnosis:  Bipolar disorder mixed in remission  Total Time spent with patient: 20 minutes  Past Psychiatric History: patient has a history of recurrent mood symptoms and has had multiple hospitalizations in the past. It through a long period of instability before her current phase of relative stability.  Past Medical History:  Past Medical History  Diagnosis Date  . Anxiety   . Depression   . Arrhythmia   . Heart murmur     Past Surgical History  Procedure Laterality Date  . 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 Back surgery     Family History:  Family History  Problem Relation Age of Onset  . Heart failure Mother   . Hypertension Mother   . Depression Mother   . Anxiety disorder Mother   . Cancer Father   . Rheum arthritis Father   . Heart failure Sister   . Rheum arthritis Sister   . Heart failure Sister   . Cancer Sister    Family Psychiatric  History: positive  family history of some mood instability Social History:  History  Alcohol Use No     History  Drug Use No    Social History   Social History  . Marital Status: Married    Spouse Name: N/A  . Number of Children: N/A  . Years of Education: N/A   Social History Main Topics  . Smoking status: Current Every Day Smoker -- 1.00 packs/day    Types: Cigarettes    Start date: 06/05/1992  . Smokeless tobacco: Never Used  . Alcohol Use: No  . Drug Use: No  . Sexual Activity: Yes    Birth Control/ Protection: None   Other Topics Concern  . None   Social History Narrative   Additional Social History:                         Sleep: Fair  Appetite:  Good  Current Medications: Current Outpatient Prescriptions  Medication Sig Dispense Refill  . amitriptyline (ELAVIL) 75 MG tablet Take 1 tablet (75 mg total) by mouth at bedtime. 30 tablet 5  . esomeprazole (NEXIUM) 40 MG capsule Take 40 mg by mouth daily before breakfast.    . FLUARIX QUADRIVALENT 0.5 ML injection TO BE ADMINISTERED BY PHARMACIST FOR IMMUNIZATION  0  . meloxicam (MOBIC) 15 MG tablet TAKE ONE TABLET BY MOUTH ONCE DAILY    . QUEtiapine (SEROQUEL) 200 MG tablet Take 1 tablet (200  mg total) by mouth 3 (three) times daily. 90 tablet 5   No current facility-administered medications for this visit.    Lab Results: No results found for this or any previous visit (from the past 48 hour(s)).  Physical Findings: AIMS:  , ,  ,  ,    CIWA:    COWS:     Musculoskeletal: Strength & Muscle Tone: decreased Gait & Station: ataxic Patient leans: N/A  Psychiatric Specialty Exam: Review of Systems  Constitutional: Negative.   HENT: Negative.   Eyes: Negative.   Respiratory: Negative.   Cardiovascular: Negative.   Gastrointestinal: Negative.   Musculoskeletal: Positive for back pain and joint pain.  Skin: Negative.   Neurological: Negative.   Psychiatric/Behavioral: Negative for depression, suicidal ideas,  hallucinations, memory loss and substance abuse. The patient has insomnia. The patient is not nervous/anxious.     Blood pressure 124/82, pulse 100, temperature 98.2 F (36.8 C), temperature source Tympanic, height 5' 2.5" (1.588 m), weight 68.675 kg (151 lb 6.4 oz), SpO2 96 %.Body mass index is 27.23 kg/(m^2).  General Appearance: Casual  Eye Contact::  Good  Speech:  Clear and Coherent  Volume:  Normal  Mood:  Euthymic  Affect:  Congruent  Thought Process:  Goal Directed  Orientation:  Full (Time, Place, and Person)  Thought Content:  Negative  Suicidal Thoughts:  No  Homicidal Thoughts:  No  Memory:  Immediate;   Fair Recent;   Fair Remote;   Fair  Judgement:  Fair  Insight:  Fair  Psychomotor Activity:  Normal  Concentration:  Fair  Recall:  [employer] of Knowledge:Fair  Language: Fair  Akathisia:  No  Handed:  Right  AIMS (if indicated):     Assets:  Communication Skills Desire for Improvement Housing Leisure Time Social Support  ADL's:  Intact  Cognition: WNL  Sleep:      Treatment Plan Summary: Medication management and Plan reviewed medication. Supportive counseling completed. Vital signs reviewed. Patient is not any acute danger to herself or others. Although she has a past history of an episode of serotonin syndrome she has been tolerating her current medicines fine without any return of symptoms. She will be continued on amitriptyline 75 mg at night and Seroquel 200 mg 3 times a day. Follow-up appointment to be made in 6 months. She agrees to the plan.  [name] 06/06/2015, 2:38 PM

## 2015-08-14 ENCOUNTER — Emergency Department
Admission: EM | Admit: 2015-08-14 | Discharge: 2015-08-14 | Disposition: A | Discharge status: Home / Self Care | Payer: Medicare Other | Attending: Emergency Medicine | Admitting: Emergency Medicine

## 2015-08-14 ENCOUNTER — Emergency Department: Payer: Medicare Other

## 2015-08-14 ENCOUNTER — Encounter: Payer: Self-pay | Admitting: Emergency Medicine

## 2015-08-14 DIAGNOSIS — Z79899 Other long term (current) drug therapy: Secondary | ICD-10-CM | POA: Diagnosis not present

## 2015-08-14 DIAGNOSIS — Z880 Allergy status to penicillin: Secondary | ICD-10-CM | POA: Diagnosis not present

## 2015-08-14 DIAGNOSIS — R197 Diarrhea, unspecified: Secondary | ICD-10-CM | POA: Insufficient documentation

## 2015-08-14 DIAGNOSIS — F17210 Nicotine dependence, cigarettes, uncomplicated: Secondary | ICD-10-CM | POA: Insufficient documentation

## 2015-08-14 DIAGNOSIS — R1011 Right upper quadrant pain: Secondary | ICD-10-CM | POA: Insufficient documentation

## 2015-08-14 DIAGNOSIS — R112 Nausea with vomiting, unspecified: Secondary | ICD-10-CM | POA: Insufficient documentation

## 2015-08-14 DIAGNOSIS — Z791 Long term (current) use of non-steroidal anti-inflammatories (NSAID): Secondary | ICD-10-CM | POA: Diagnosis not present

## 2015-08-14 DIAGNOSIS — R109 Unspecified abdominal pain: Secondary | ICD-10-CM | POA: Diagnosis present

## 2015-08-14 LAB — CBC
HCT: 51 % — ABNORMAL HIGH (ref 35.0–47.0)
HEMOGLOBIN: 16.6 g/dL — AB (ref 12.0–16.0)
MCH: 28.9 pg (ref 26.0–34.0)
MCHC: 32.6 g/dL (ref 32.0–36.0)
MCV: 88.8 fL (ref 80.0–100.0)
PLATELETS: 231 10*3/uL (ref 150–440)
RBC: 5.74 MIL/uL — AB (ref 3.80–5.20)
RDW: 15.1 % — ABNORMAL HIGH (ref 11.5–14.5)
WBC: 11.5 10*3/uL — ABNORMAL HIGH (ref 3.6–11.0)

## 2015-08-14 LAB — URINALYSIS COMPLETE WITH MICROSCOPIC (ARMC ONLY)
BILIRUBIN URINE: NEGATIVE
Glucose, UA: NEGATIVE mg/dL
KETONES UR: NEGATIVE mg/dL
LEUKOCYTES UA: NEGATIVE
NITRITE: NEGATIVE
PH: 8 (ref 5.0–8.0)
Protein, ur: NEGATIVE mg/dL
SPECIFIC GRAVITY, URINE: 1.009 (ref 1.005–1.030)

## 2015-08-14 LAB — COMPREHENSIVE METABOLIC PANEL
ALK PHOS: 84 U/L (ref 38–126)
ALT: 12 U/L — ABNORMAL LOW (ref 14–54)
ANION GAP: 9 (ref 5–15)
AST: 14 U/L — ABNORMAL LOW (ref 15–41)
Albumin: 4.3 g/dL (ref 3.5–5.0)
BILIRUBIN TOTAL: 0.6 mg/dL (ref 0.3–1.2)
BUN: 13 mg/dL (ref 6–20)
CALCIUM: 9.6 mg/dL (ref 8.9–10.3)
CO2: 29 mmol/L (ref 22–32)
CREATININE: 0.76 mg/dL (ref 0.44–1.00)
Chloride: 100 mmol/L — ABNORMAL LOW (ref 101–111)
GFR calc non Af Amer: >60 mL/min (ref >60)
Glucose, Bld: 103 mg/dL — ABNORMAL HIGH (ref 65–99)
Potassium: 3.9 mmol/L (ref 3.5–5.1)
Sodium: 138 mmol/L (ref 135–145)
TOTAL PROTEIN: 8.7 g/dL — AB (ref 6.5–8.1)

## 2015-08-14 LAB — LIPASE, BLOOD: Lipase: 22 U/L (ref 11–51)

## 2015-08-14 MED ORDER — MORPHINE SULFATE (PF) 4 MG/ML IV SOLN
INTRAVENOUS | Status: AC
  Filled 2015-08-14: qty 1

## 2015-08-14 MED ORDER — SUCRALFATE 1 G PO TABS: 1.0000 g | ORAL_TABLET | Freq: Four times a day (QID) | ORAL | Status: DC

## 2015-08-14 MED ORDER — ONDANSETRON HCL 4 MG/2ML IJ SOLN
4.0000 mg | Freq: Once | INTRAMUSCULAR | Status: AC
  Administered 2015-08-14: 4 mg via INTRAVENOUS
  Filled 2015-08-14: qty 2

## 2015-08-14 MED ORDER — MORPHINE SULFATE (PF) 4 MG/ML IV SOLN
4.0000 mg | Freq: Once | INTRAVENOUS | Status: AC
  Administered 2015-08-14: 4 mg via INTRAVENOUS

## 2015-08-14 MED ORDER — IOHEXOL 300 MG/ML  SOLN
100.0000 mL | Freq: Once | INTRAMUSCULAR | Status: AC | PRN
  Administered 2015-08-14: 100 mL via INTRAVENOUS
  Filled 2015-08-14: qty 100

## 2015-08-14 MED ORDER — SODIUM CHLORIDE 0.9 % IV BOLUS (SEPSIS)
1000.0000 mL | Freq: Once | INTRAVENOUS | Status: AC
  Administered 2015-08-14: 1000 mL via INTRAVENOUS

## 2015-08-14 MED ORDER — IOHEXOL 240 MG/ML SOLN
25.0000 mL | Freq: Once | INTRAMUSCULAR | Status: AC | PRN
  Administered 2015-08-14: 25 mL via ORAL
  Filled 2015-08-14: qty 25

## 2015-08-14 NOTE — ED Notes (Signed)
Pt was asked about her reaction to Vicodin, and reported tongue swelling and throat swelling.  She was asked if she had ever been able to take any other pain medication and reported that she was able to take morphine and oxycodone.

## 2015-08-14 NOTE — ED Notes (Signed)
Pt c/o right upper quad pain intermit [id number]. Pt c/o nausea and vomiting since last night. Pt has hx of gastritis . Pt nauseas in triage

## 2015-08-14 NOTE — ED Provider Notes (Signed)
[facility] Emergency Department Provider Note    ____________________________________________  Time seen: 1825  I have reviewed the triage vital signs and the nursing notes.   HISTORY  Chief Complaint Abdominal Pain   History limited by: Not Limited   HPI Maria Hodge is a 54 y.o. female with history of GERD who presents to the emergency department today because of concerns for right upper quadrant abdominal pain nausea and vomiting. The patient states that she has had some discomfort and pain there on and off for the past month. She describes pain as being sharp. She states that she thinks that eating concerning foods causes the pain come on. She states that she came in today because the pain started last night is been persistent. She has had persistent nausea and vomiting. She has been unable to eat. The pain is severe and sharp. She states that many female members of her family and had to have their gallbladders removed. She denies any fevers.  Past Medical History  Diagnosis Date  . Anxiety   . Depression   . Arrhythmia   . Heart murmur     There are no active problems to display for this patient.   Past Surgical History  Procedure Laterality Date  . 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 Back surgery      Current Outpatient Rx  Name  Route  Sig  Dispense  Refill  . amitriptyline (ELAVIL) 75 MG tablet   Oral   Take 1 tablet (75 mg total) by mouth at bedtime.   30 tablet   5   . esomeprazole (NEXIUM) 40 MG capsule   Oral   Take 40 mg by mouth daily before breakfast.         . FLUARIX QUADRIVALENT 0.5 ML injection      TO BE ADMINISTERED BY  PHARMACIST FOR IMMUNIZATION      0     Dispense as written.   . meloxicam (MOBIC) 15 MG tablet      TAKE ONE TABLET BY MOUTH ONCE DAILY         . QUEtiapine (SEROQUEL) 200 MG tablet   Oral   Take 1 tablet (200 mg total) by mouth 3 (three) times daily.   90 tablet   5     Allergies Penicillins; Vicodin; and Aspirin  Family History  Problem Relation Age of Onset  . Heart failure Mother   . Hypertension Mother   . Depression Mother   . Anxiety disorder Mother   . Cancer Father   . Rheum arthritis Father   . Heart failure Sister   . Rheum arthritis Sister   . Heart failure Sister   . Cancer Sister     Social History Social History  Substance Use Topics  . Smoking status: Current Every Day Smoker -- 1.00 packs/day    Types: Cigarettes    Start date: 06/05/1992  . Smokeless tobacco: Never Used  . Alcohol Use: No    Review of Systems  Constitutional: Negative for fever. Cardiovascular: Negative for chest pain. Respiratory: Negative for shortness of breath. Gastrointestinal: Positive for abdominal pain, vomiting and diarrhea. Genitourinary: Negative for dysuria. Musculoskeletal: Negative for back pain. Skin: Negative for rash. Neurological: Negative  for headaches, focal weakness or numbness.   10-point ROS otherwise negative.  ____________________________________________   PHYSICAL EXAM:  VITAL SIGNS: ED Triage Vitals  Enc Vitals Group     BP --      Pulse Rate 08/14/15 1740 72     Resp 08/14/15 1740 16     Temp 08/14/15 1740 98.5 F (36.9 C)     Temp Source 08/14/15 1740 Oral     SpO2 08/14/15 1740 96 %     Weight 08/14/15 1740 150 lb (68.04 kg)     Height 08/14/15 1740 5\' 4"  (1.626 m)     Head Cir --      Peak Flow --      Pain Score 08/14/15 1740 6   Constitutional: Alert and oriented. Well appearing and in no distress. Eyes: Conjunctivae are normal. PERRL. Normal extraocular movements. ENT   Head: Normocephalic and atraumatic.    Nose: No congestion/rhinnorhea.   Mouth/Throat: Mucous membranes are moist.   Neck: No stridor. Hematological/Lymphatic/Immunilogical: No cervical lymphadenopathy. Cardiovascular: Normal rate, regular rhythm.  No murmurs, rubs, or gallops. Respiratory: Normal respiratory effort without tachypnea nor retractions. Breath sounds are clear and equal bilaterally. No wheezes/rales/rhonchi. Gastrointestinal: Soft and tender to palpation in the right upper quadrant. Positive Murphy sign. No rebound. No guarding. Mild right-sided CVA tenderness. Genitourinary: Deferred Musculoskeletal: Normal range of motion in all extremities. No joint effusions.  No lower extremity tenderness nor edema. Neurologic:  Normal speech and language. No gross focal neurologic deficits are appreciated.  Skin:  Skin is warm, dry and intact. No rash noted. Psychiatric: Mood and affect are normal. Speech and behavior are normal. Patient exhibits appropriate insight and judgment.  ____________________________________________    LABS (pertinent positives/negatives)  Labs Reviewed  COMPREHENSIVE METABOLIC PANEL - Abnormal; Notable for the following:    Chloride 100 (*)    Glucose, Bld 103 (*)    Total Protein 8.7 (*)    AST 14 (*)    ALT 12 (*)    All other components within normal limits  CBC - Abnormal; Notable for the following:    WBC 11.5 (*)    RBC 5.74 (*)    Hemoglobin 16.6 (*)    HCT 51.0 (*)    RDW 15.1 (*)    All other components within normal limits  URINALYSIS COMPLETEWITH MICROSCOPIC ([facility] ONLY) - Abnormal; Notable for the following:    Color, Urine STRAW (*)    APPearance CLEAR (*)    Hgb urine dipstick 2+ (*)    Bacteria, UA RARE (*)    Squamous Epithelial / LPF 0-5 (*)    All other components within normal limits  LIPASE, BLOOD     ____________________________________________   EKG  None  ____________________________________________    RADIOLOGY  RUQ US IMPRESSION: Study  within normal limits.   ____________________________________________   PROCEDURES  Procedure(s) performed: None  Critical Care performed: No  ____________________________________________   INITIAL IMPRESSION / ASSESSMENT AND PLAN / ED COURSE  Pertinent labs & imaging results that were available during my care of the patient were reviewed by me and considered in my medical decision making (see chart for details).  Patient presented to the emergency department today with right upper quadrant epigastric pain at its worse with eating. Ultrasound was performed which did not sound any obvious etiology. The patient's urine did have some red blood cells and blood in it raising the possibility of a kidney stone. Patient did not have a great clinical story however  since they do not have a its worse the pain I did order a CT abdomen and pelvis. This did not show an obvious etiology of the pain. I discussed this finding with patient. Will plan on discharging and will give GI follow-up for the pain as well as urology follow-up for the hematuria. Furthermore will give the patient should've a given that its potentially possible that the patient is suffering pain from a duodenal or gastric ulcer. Patient is already on Nexium.  ____________________________________________   FINAL CLINICAL IMPRESSION(S) / ED DIAGNOSES  Final diagnoses:  RUQ pain     Phineas Semen, MD 08/14/15 2209

## 2015-08-14 NOTE — ED Notes (Signed)
Patient transported to CT 

## 2015-08-14 NOTE — ED Notes (Signed)
Reviewed d/c instructions, follow-up care/appointments, and prescription with pt.  Pt verbalized understading.

## 2015-08-14 NOTE — Discharge Instructions (Signed)
Please seek medical attention for any high fevers, chest pain, shortness of breath, change in behavior, persistent vomiting, bloody stool or any other new or concerning symptoms. ° ° °Abdominal Pain, Adult °Many things can cause abdominal pain. Usually, abdominal pain is not caused by a disease and will improve without treatment. It can often be observed and treated at home. Your health care provider will do a physical exam and possibly order blood tests and X-rays to help determine the seriousness of your pain. However, in many cases, more time must pass before a clear cause of the pain can be found. Before that point, your health care provider may not know if you need more testing or further treatment. °HOME CARE INSTRUCTIONS °Monitor your abdominal pain for any changes. The following actions may help to alleviate any discomfort you are experiencing: °· Only take over-the-counter or prescription medicines as directed by your health care provider. °· Do not take laxatives unless directed to do so by your health care provider. °· Try a clear liquid diet (broth, tea, or water) as directed by your health care provider. Slowly move to a bland diet as tolerated. °SEEK MEDICAL CARE IF: °· You have unexplained abdominal pain. °· You have abdominal pain associated with nausea or diarrhea. °· You have pain when you urinate or have a bowel movement. °· You experience abdominal pain that wakes you in the night. °· You have abdominal pain that is worsened or improved by eating food. °· You have abdominal pain that is worsened with eating fatty foods. °· You have a fever. °SEEK IMMEDIATE MEDICAL CARE IF: °· Your pain does not go away within 2 hours. °· You keep throwing up (vomiting). °· Your pain is felt only in portions of the abdomen, such as the right side or the left lower portion of the abdomen. °· You pass bloody or black tarry stools. °MAKE SURE YOU: °· Understand these instructions. °· Will watch your  condition. °· Will get help right away if you are not doing well or get worse. °  °This information is not intended to replace advice given to you by your health care provider. Make sure you discuss any questions you have with your health care provider. °  °Document Released: 05/27/2005 Document Revised: 05/08/2015 Document Reviewed: 04/26/2013 °Elsevier Interactive Patient Education ©2016 [employer] ° °

## 2015-12-05 ENCOUNTER — Ambulatory Visit: Payer: 59 | Admitting: Psychiatry

## 2015-12-23 ENCOUNTER — Other Ambulatory Visit: Payer: Self-pay

## 2015-12-23 NOTE — Telephone Encounter (Signed)
pt needs refill on her medications pt was last seen on  06-06-15 next appt  01-02-16

## 2015-12-24 ENCOUNTER — Other Ambulatory Visit: Payer: Self-pay | Admitting: Psychiatry

## 2015-12-26 MED ORDER — QUETIAPINE FUMARATE 200 MG PO TABS: 200.0000 mg | ORAL_TABLET | Freq: Three times a day (TID) | ORAL | Status: DC

## 2015-12-26 MED ORDER — AMITRIPTYLINE HCL 75 MG PO TABS: 75.0000 mg | ORAL_TABLET | Freq: Every day | ORAL | Status: DC

## 2016-01-02 ENCOUNTER — Ambulatory Visit: Payer: 59 | Admitting: Psychiatry

## 2016-02-14 ENCOUNTER — Other Ambulatory Visit: Payer: Self-pay | Admitting: Psychiatry

## 2016-02-17 ENCOUNTER — Telehealth: Payer: Self-pay | Admitting: Psychiatry

## 2016-02-18 ENCOUNTER — Telehealth: Payer: Self-pay

## 2016-02-18 ENCOUNTER — Ambulatory Visit (INDEPENDENT_AMBULATORY_CARE_PROVIDER_SITE_OTHER): Payer: 59 | Admitting: Psychiatry

## 2016-02-18 ENCOUNTER — Encounter: Payer: Self-pay | Admitting: Psychiatry

## 2016-02-18 VITALS — BP 138/86 | HR 100 | Temp 98.6°F | Ht 64.0 in | Wt 153.6 lb

## 2016-02-18 DIAGNOSIS — T887XXA Unspecified adverse effect of drug or medicament, initial encounter: Secondary | ICD-10-CM

## 2016-02-18 DIAGNOSIS — T50905A Adverse effect of unspecified drugs, medicaments and biological substances, initial encounter: Secondary | ICD-10-CM

## 2016-02-18 DIAGNOSIS — F3162 Bipolar disorder, current episode mixed, moderate: Secondary | ICD-10-CM | POA: Diagnosis not present

## 2016-02-18 MED ORDER — QUETIAPINE FUMARATE 200 MG PO TABS: 200.0000 mg | ORAL_TABLET | Freq: Four times a day (QID) | ORAL | Status: DC

## 2016-02-18 MED ORDER — AMITRIPTYLINE HCL 75 MG PO TABS: ORAL_TABLET | ORAL | Status: DC

## 2016-02-18 NOTE — Telephone Encounter (Signed)
pt came back to the office stated that [facility] not [facility] her [facility] for seroquel.

## 2016-02-18 NOTE — Telephone Encounter (Signed)
pt has appt for today

## 2016-02-18 NOTE — Progress Notes (Signed)
BH MD/PA/NP OP Progress Note  02/18/2016 7:15 PM Maria Hodge  MRN:  [id number]  Chief Complaint:  Chief Complaint    Follow-up; Medication Refill     Subjective:  "I'm doing good" HPI: Follow-up for this 55 year old woman with a history of mood instability and depression. She has continued to feel quite stable. Remarkably better than she was doing a couple years ago. Denies any side effects from the medicine except a little bit of weight gain. No depression. No suicidal thoughts no psychosis. Patient does suggest that she thinks that she would do better if she had 400 mg twice a day or 200 mg 4 times a day of the Seroquel. Side effects reviewed. I will agree to trying going up on the dose. Patient was also advised that she needs to get her hemoglobin A1c and lipid panel checked. Labs will be ordered. She is instructed to go to [facility] and get those done as soon as possible. Continue medicines as prescribed for now. Continue the Elavil. There is no sign of any return of serotonin syndrome Visit Diagnosis:    ICD-9-CM ICD-10-CM   1. Medication side effect, initial encounter E947.9 T88.7XXA Lipid panel     Hemoglobin A1C  2. Bipolar 1 disorder, mixed, moderate (HCC) 296.62 F31.62     Past Psychiatric History: Chronic depression.  Past Medical History:  Past Medical History  Diagnosis Date  . Anxiety   . Depression   . Arrhythmia   . Heart murmur     Past Surgical History  Procedure Laterality Date  . 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 Back surgery      Family Psychiatric History: Depression and anxiety  Family History:  Family History  Problem Relation Age of Onset  . Heart failure  Mother   . Hypertension Mother   . Depression Mother   . Anxiety disorder Mother   . Cancer Father   . Rheum arthritis Father   . Heart failure Sister   . Rheum arthritis Sister   . Heart failure Sister   . Cancer Sister     Social History:  Social History   Social History  . Marital Status: Married    Spouse Name: N/A  . Number of Children: N/A  . Years of Education: N/A   Social History Main Topics  . Smoking status: Current Every Day Smoker -- 1.00 packs/day    Types: Cigarettes    Start date: 06/05/1992  . Smokeless tobacco: Never Used  . Alcohol Use: No  . Drug Use: No  . Sexual Activity: Yes    Birth Control/ Protection: None   Other Topics Concern  . None   Social History Narrative    Allergies:  Allergies  Allergen Reactions  . Penicillins Swelling  . Vicodin [Hydrocodone-Acetaminophen] Swelling  . Aspirin Other (See Comments)    Gastritis  . Hydrocodone-Acetaminophen Hives    Metabolic Disorder Labs: No results found for: HGBA1C, MPG No results found for: PROLACTIN Lab Results  Component Value Date   CHOL 195 06/03/2012   TRIG 64 06/03/2012   HDL 47 06/03/2012   VLDL 13 06/03/2012   LDLCALC 135* 06/03/2012   LDLCALC 141* 02/24/2012     Current Medications: Current Outpatient Prescriptions  Medication Sig Dispense Refill  . amitriptyline (ELAVIL) 75 MG tablet TAKE 1 TABLET (75 MG TOTAL) BY MOUTH AT BEDTIME. 30 tablet 5  . FLUARIX QUADRIVALENT 0.5 ML injection TO BE ADMINISTERED BY PHARMACIST FOR IMMUNIZATION  0  . QUEtiapine (SEROQUEL) 200 MG tablet Take 1 tablet (200 mg total) by mouth 4 (four) times daily. 120 tablet 5   No current facility-administered medications for this visit.    Neurologic: Headache: Negative Seizure: Negative Paresthesias: Negative  Musculoskeletal: Strength & Muscle Tone: within normal limits Gait & Station: normal Patient leans: N/A  Psychiatric Specialty Exam: ROS  Blood pressure 138/86, pulse 100,  temperature 98.6 F (37 C), temperature source Tympanic, height 5\' 4"  (1.626 m), weight 153 lb 9.6 oz (69.673 kg), SpO2 93 %.Body mass index is 26.35 kg/(m^2).  General Appearance: Casual  Eye Contact:  Good  Speech:  Normal Rate  Volume:  Decreased  Mood:  Euthymic  Affect:  Appropriate  Thought Process:  Goal Directed  Orientation:  Full (Time, Place, and Person)  Thought Content: Logical   Suicidal Thoughts:  No  Homicidal Thoughts:  No  Memory:  Immediate;   Good Recent;   Good Remote;   Fair  Judgement:  Fair  Insight:  Fair  Psychomotor Activity:  Normal  Concentration:  Concentration: Fair  Recall:  FiservFair  Fund of Knowledge: Fair  Language: Fair  Akathisia:  No  Handed:  Right  AIMS (if indicated):  No sign of tardive dyskinesia   Assets:  ArchitectCommunication Skills Financial Resources/Insurance Housing Physical Health Resilience Social Support  ADL's:  Intact  Cognition: WNL  Sleep:  Sleeping 7 or 8 hours a night      Treatment Plan Summary:Medication management and Plan As noted above increase the Seroquel to 200 mg 4 times a day. Continue amitriptyline. Order lab studies which she is to get and have done as soon as possible at [facility]. Patient agrees to plan. Follow-up 6 months.   Mordecai RasmussenJohn Samual Beals, MD 02/18/2016, 7:15 PM

## 2016-02-18 NOTE — Telephone Encounter (Signed)
called pharmacy to find out what was going on.  per the pharmacy pt insurance will not cover no more then three pill taking with this medication.  asked pharmaist to give pt 400mg  bid instead of the 200mg  4 times a day. with 5 refill.  (insurance will cover )

## 2016-02-26 NOTE — Telephone Encounter (Signed)
Thank you so much for dealing with this.

## 2016-05-03 ENCOUNTER — Encounter: Payer: Self-pay | Admitting: Emergency Medicine

## 2016-05-03 ENCOUNTER — Emergency Department: Payer: Medicare Other

## 2016-05-03 ENCOUNTER — Emergency Department
Admission: EM | Admit: 2016-05-03 | Discharge: 2016-05-03 | Disposition: A | Discharge status: Home / Self Care | Payer: Medicare Other | Attending: Emergency Medicine | Admitting: Emergency Medicine

## 2016-05-03 DIAGNOSIS — Z79899 Other long term (current) drug therapy: Secondary | ICD-10-CM | POA: Insufficient documentation

## 2016-05-03 DIAGNOSIS — Q790 Congenital diaphragmatic hernia: Secondary | ICD-10-CM | POA: Diagnosis not present

## 2016-05-03 DIAGNOSIS — F17210 Nicotine dependence, cigarettes, uncomplicated: Secondary | ICD-10-CM | POA: Insufficient documentation

## 2016-05-03 DIAGNOSIS — R1010 Upper abdominal pain, unspecified: Secondary | ICD-10-CM

## 2016-05-03 LAB — BASIC METABOLIC PANEL
Anion gap: 8 (ref 5–15)
BUN: 11 mg/dL (ref 6–20)
CHLORIDE: 103 mmol/L (ref 101–111)
CO2: 29 mmol/L (ref 22–32)
Calcium: 9.8 mg/dL (ref 8.9–10.3)
Creatinine, Ser: 0.72 mg/dL (ref 0.44–1.00)
GFR calc non Af Amer: >60 mL/min (ref >60)
Glucose, Bld: 114 mg/dL — ABNORMAL HIGH (ref 65–99)
POTASSIUM: 3.9 mmol/L (ref 3.5–5.1)
SODIUM: 140 mmol/L (ref 135–145)

## 2016-05-03 LAB — URINALYSIS COMPLETE WITH MICROSCOPIC (ARMC ONLY)
Bilirubin Urine: NEGATIVE
GLUCOSE, UA: NEGATIVE mg/dL
Leukocytes, UA: NEGATIVE
NITRITE: NEGATIVE
Protein, ur: 30 mg/dL — AB
Specific Gravity, Urine: 1.019 (ref 1.005–1.030)
pH: 6 (ref 5.0–8.0)

## 2016-05-03 LAB — CBC
HEMATOCRIT: 52.2 % — AB (ref 35.0–47.0)
HEMOGLOBIN: 17.9 g/dL — AB (ref 12.0–16.0)
MCH: 30.4 pg (ref 26.0–34.0)
MCHC: 34.2 g/dL (ref 32.0–36.0)
MCV: 88.7 fL (ref 80.0–100.0)
Platelets: 180 10*3/uL (ref 150–440)
RBC: 5.89 MIL/uL — AB (ref 3.80–5.20)
RDW: 15.1 % — ABNORMAL HIGH (ref 11.5–14.5)
WBC: 7.6 10*3/uL (ref 3.6–11.0)

## 2016-05-03 LAB — HEPATIC FUNCTION PANEL
ALT: 13 U/L — AB (ref 14–54)
AST: 16 U/L (ref 15–41)
Albumin: 4.3 g/dL (ref 3.5–5.0)
Alkaline Phosphatase: 73 U/L (ref 38–126)
BILIRUBIN TOTAL: 0.5 mg/dL (ref 0.3–1.2)
Total Protein: 8.3 g/dL — ABNORMAL HIGH (ref 6.5–8.1)

## 2016-05-03 LAB — TROPONIN I: Troponin I: <0.03 ng/mL (ref <0.03)

## 2016-05-03 LAB — LIPASE, BLOOD: Lipase: 17 U/L (ref 11–51)

## 2016-05-03 MED ORDER — ONDANSETRON 4 MG PO TBDP: 4.0000 mg | ORAL_TABLET | Freq: Four times a day (QID) | ORAL | 0 refills | Status: DC | PRN

## 2016-05-03 MED ORDER — OXYCODONE HCL 5 MG PO TABS: 5.0000 mg | ORAL_TABLET | Freq: Four times a day (QID) | ORAL | 0 refills | Status: DC | PRN

## 2016-05-03 MED ORDER — IOPAMIDOL (ISOVUE-300) INJECTION 61%
100.0000 mL | Freq: Once | INTRAVENOUS | Status: AC | PRN
  Administered 2016-05-03: 100 mL via INTRAVENOUS

## 2016-05-03 MED ORDER — SODIUM CHLORIDE 0.9 % IV BOLUS (SEPSIS)
2000.0000 mL | Freq: Once | INTRAVENOUS | Status: AC
  Administered 2016-05-03: 1000 mL via INTRAVENOUS

## 2016-05-03 MED ORDER — IOPAMIDOL (ISOVUE-300) INJECTION 61%
30.0000 mL | Freq: Once | INTRAVENOUS | Status: AC | PRN
  Administered 2016-05-03: 30 mL via ORAL

## 2016-05-03 MED ORDER — OXYCODONE HCL 5 MG PO TABS
5.0000 mg | ORAL_TABLET | Freq: Once | ORAL | Status: AC
  Administered 2016-05-03: 5 mg via ORAL
  Filled 2016-05-03: qty 1

## 2016-05-03 MED ORDER — ONDANSETRON HCL 4 MG/2ML IJ SOLN
4.0000 mg | Freq: Once | INTRAMUSCULAR | Status: AC
  Administered 2016-05-03: 4 mg via INTRAVENOUS
  Filled 2016-05-03: qty 2

## 2016-05-03 MED ORDER — MORPHINE SULFATE (PF) 4 MG/ML IV SOLN
4.0000 mg | Freq: Once | INTRAVENOUS | Status: AC
  Administered 2016-05-03: 4 mg via INTRAVENOUS
  Filled 2016-05-03: qty 1

## 2016-05-03 NOTE — ED Provider Notes (Signed)
-----------------------------------------   6:08 PM on 05/03/2016 -----------------------------------------  Patient comfortable, understands planned to follow closely with gastroenterology for which she requests Dr. Mechele CollinElliott whom husband has seen. Careful abdominal pain return precautions discussed.  Pain well-controlled, resting comfortably at this time. I will prescribe the patient a narcotic pain medicine due to their condition which I anticipate will cause at least moderate pain short term. I discussed with the patient safe use of narcotic pain medicines, and that they are not to drive, work in dangerous areas, or ever take more than prescribed (no more than 1 pill every 6 hours). We discussed that this is the type of medication that can be  overdosed on and the risks of this type of medicine. Patient is very agreeable to only use as prescribed and to never use more than prescribed.   Husband will be driving her home, she'll be setting up close abdominal follow-up with gastroenterology.  Vitals:   05/03/16 1630 05/03/16 1700  BP: (!) 129/91 138/84  Pulse: 94 94  Resp: 16   Temp:        Sharyn CreamerMark Jaliya Siegmann, MD 05/03/16 [phone]

## 2016-05-03 NOTE — Discharge Instructions (Signed)
? ?  Please return to the emergency room right away if you are to develop a fever, severe nausea, your pain becomes severe or worsens, you are unable to keep food down, begin vomiting any dark or bloody fluid, you develop any dark or bloody stools, feel dehydrated, or other new concerns or symptoms arise. ? ?

## 2016-05-03 NOTE — ED Triage Notes (Signed)
Pt here for bilateral flank pain, N/V, constipation that has lasted 3 weeks or more. Denies urinary problems. Per pt had fever last week, last time vomit day before yesterday.

## 2016-05-03 NOTE — ED Notes (Signed)
Pt up dated on plan to discharge. Nurse apologizes for wait due to emergency in another room pt agreeable.

## 2016-05-03 NOTE — ED Provider Notes (Signed)
[facility] Emergency Department Provider Note  ____________________________________________  Time seen: Approximately 2:50 PM  I have reviewed the triage vital signs and the nursing notes.   HISTORY  Chief Complaint Flank Pain   HPI Maria Hodge is a 55 y.o. female with history of anxiety, depression who presents for evaluation of abdominal pain. Patient reports that she has had the same abdominal pain since December. The pain is cramping, intermittent, located in the right upper and left upper quadrants, associated with severe nausea and anorexia. She endorses a 20 pound weight loss since December. 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 RUQ US was negative for stones. Patient reports she has not seen a GI doctor and has never had an endoscopy or colonoscopy. She does endorse severe reflux. She takes Nexium for that. She denies CP, SOB, fever, dysuria, hematuria, diarrhea. She does endorse intermittent constipation and her last bowel movement was 3 days ago. She does not take any medications for that. Denies any prior abdominal surgeries. Patient reports that her vomiting is intermittent and she has a few times a week. She currently endorses pain 8/10.   Past Medical History:  Diagnosis Date  . Anxiety   . Arrhythmia   . Depression   . Heart murmur     There are no active problems to display for this patient.   Past Surgical History:  Procedure Laterality Date  . BACK SURGERY    . 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 Authorizing Provider  amitriptyline (ELAVIL) 75 MG tablet TAKE 1 TABLET (75 MG TOTAL) BY MOUTH AT BEDTIME. 02/18/16   Audery AmelJohn T Clapacs, MD  FLUARIX QUADRIVALENT 0.5 ML injection TO BE ADMINISTERED BY PHARMACIST FOR IMMUNIZATION 05/17/15   Historical Provider, MD  ondansetron (ZOFRAN ODT) 4 MG disintegrating tablet Take 1 tablet (4 mg total) by mouth every 6 (six) hours as needed for nausea or vomiting. 05/03/16   Sharyn CreamerMark Quale, MD  oxyCODONE (OXY IR/ROXICODONE) 5 MG immediate release tablet Take 1 tablet (5 mg total) by mouth every 6 (six) hours as needed for severe pain. 05/03/16   Sharyn CreamerMark Quale, MD  QUEtiapine (SEROQUEL) 200 MG tablet Take 1 tablet (200 mg total) by mouth 4 (four) times daily. 02/18/16   Audery AmelJohn T Clapacs, MD    Allergies Penicillins; Vicodin [hydrocodone-acetaminophen]; Aspirin; and Hydrocodone-acetaminophen  Family History  Problem Relation Age of Onset  . Heart failure Mother   . Hypertension Mother   . Depression Mother   . Anxiety disorder Mother   . Cancer Father   . Rheum arthritis Father   . Heart failure Sister   . Rheum arthritis Sister   . Heart failure Sister   . Cancer Sister     Social History Social History  Substance Use Topics  . Smoking status: Current Every Day Smoker    Packs/day: 1.00    Types: Cigarettes    Start date: 06/05/1992  . Smokeless tobacco: Never Used  . Alcohol use No    Review of Systems  Constitutional: Negative for fever. Eyes: Negative  for visual changes. ENT: Negative for sore throat. Cardiovascular: Negative for chest pain. Respiratory: Negative for shortness of breath. Gastrointestinal: + abdominal pain, vomiting, constipation, weight loss, anorexia. No diarrhea. Genitourinary: Negative for dysuria. Musculoskeletal: Negative for back pain. Skin: Negative for rash. Neurological: Negative for headaches, weakness or numbness.  ____________________________________________   PHYSICAL  EXAM:  VITAL SIGNS: ED Triage Vitals  Enc Vitals Group     BP 05/03/16 1115 117/82     Pulse --      Resp --      Temp 05/03/16 1115 98.5 F (36.9 C)     Temp Source 05/03/16 1115 Oral     SpO2 05/03/16 1115 96 %     Weight 05/03/16 1116 145 lb (65.8 kg)     Height 05/03/16 1116 5\' 4"  (1.626 m)     Head Circumference --      Peak Flow --      Pain Score 05/03/16 1116 6     Pain Loc --      Pain Edu? --      Excl. in GC? --     Constitutional: Alert and oriented. Well appearing and in no apparent distress. HEENT:      Head: Normocephalic and atraumatic.         Eyes: Conjunctivae are normal. Sclera is non-icteric. EOMI. PERRL      Mouth/Throat: Mucous membranes are moist.       Neck: Supple with no signs of meningismus. Cardiovascular: Regular rate and rhythm. No murmurs, gallops, or rubs. 2+ symmetrical distal pulses are present in all extremities. No JVD. Respiratory: Normal respiratory effort. Lungs are clear to auscultation bilaterally. No wheezes, crackles, or rhonchi.  Gastrointestinal: Soft, mild ttp over the LUQ and epigastric regions, non distended with positive bowel sounds. No rebound or guarding. Genitourinary: No CVA tenderness. Musculoskeletal: Nontender with normal range of motion in all extremities. No edema, cyanosis, or erythema of extremities. Neurologic: Normal speech and language. Face is symmetric. Moving all extremities. No gross focal neurologic deficits are appreciated. Skin: Skin is warm, dry and intact. No rash noted. Psychiatric: Mood and affect are normal. Speech and behavior are normal.  ____________________________________________   LABS (all labs ordered are listed, but only abnormal results are displayed)  Labs Reviewed  URINALYSIS COMPLETEWITH MICROSCOPIC ([facility] ONLY) - Abnormal; Notable for the following:       Result Value   Color, Urine YELLOW (*)    APPearance HAZY (*)    Ketones, ur TRACE (*)    Hgb urine dipstick 1+ (*)     Protein, ur 30 (*)    Bacteria, UA RARE (*)    Squamous Epithelial / LPF 6-30 (*)    All other components within normal limits  BASIC METABOLIC PANEL - Abnormal; Notable for the following:    Glucose, Bld 114 (*)    All other components within normal limits  CBC - Abnormal; Notable for the following:    RBC 5.89 (*)    Hemoglobin 17.9 (*)    HCT 52.2 (*)    RDW 15.1 (*)    All other components within normal limits  HEPATIC FUNCTION PANEL - Abnormal; Notable for the following:    Total Protein 8.3 (*)    ALT 13 (*)    Bilirubin, Direct <0.1 (*)    All other components within normal limits  LIPASE, BLOOD  TROPONIN I   ____________________________________________  EKG  ED ECG REPORT I, Nita Sickle, the attending physician, personally viewed and  interpreted this ECG.  NSR, rate 79, normal intervals, normal axis, no STE or depressions, TWI in III and aVF. Unchanged from prior from 2012  ____________________________________________  RADIOLOGY  CT a/p: pending  ____________________________________________   PROCEDURES  Procedure(s) performed: None Procedures Critical Care performed:  None ____________________________________________   INITIAL IMPRESSION / ASSESSMENT AND PLAN / ED COURSE  55 y.o. female with history of anxiety, depression who presents for evaluation of 9 months of intermittent abdominal pain as stated with anorexia, nausea, intermittent vomiting, constipation, and unintentional 20 pound weight loss. Patient is in no distress, her vital signs are within normal limits, her abdomen has tenderness to palpation on the epigastric and left upper quadrant regions with no rebound or guarding. 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 Plan for IV morphine, IV fluids, IV Zofran for her symptoms. We'll check labs and a UA, we'll repeat the CT scan to evaluate for evidence of obstruction versus  diverticulitis. If CT is negative plan to dc home with f/u with GI for endoscopy and colonoscopy. Care transferred to Dr. Fanny Bien  Clinical Course    Pertinent labs & imaging results that were available during my care of the patient were reviewed by me and considered in my medical decision making (see chart for details).    ____________________________________________   FINAL CLINICAL IMPRESSION(S) / ED DIAGNOSES  Final diagnoses:  Morgagni hernia  Upper abdominal pain      NEW MEDICATIONS STARTED DURING THIS VISIT:  Discharge Medication List as of 05/03/2016  6:09 PM    START taking these medications   Details  ondansetron (ZOFRAN ODT) 4 MG disintegrating tablet Take 1 tablet (4 mg total) by mouth every 6 (six) hours as needed for nausea or vomiting., Starting Sun 05/03/2016, Print    oxyCODONE (OXY IR/ROXICODONE) 5 MG immediate release tablet Take 1 tablet (5 mg total) by mouth every 6 (six) hours as needed for severe pain., Starting Sun 05/03/2016, Print         Note:  This document was prepared using Dragon voice recognition software and may include unintentional dictation errors.    Nita Sickle, MD 05/03/16 2156

## 2016-05-15 ENCOUNTER — Emergency Department
Admission: EM | Admit: 2016-05-15 | Discharge: 2016-05-15 | Discharge status: Against Medical Advice | Payer: Medicare Other | Attending: Emergency Medicine | Admitting: Emergency Medicine

## 2016-05-15 ENCOUNTER — Emergency Department: Payer: Medicare Other

## 2016-05-15 ENCOUNTER — Encounter: Payer: Self-pay | Admitting: Emergency Medicine

## 2016-05-15 DIAGNOSIS — F17210 Nicotine dependence, cigarettes, uncomplicated: Secondary | ICD-10-CM | POA: Diagnosis not present

## 2016-05-15 DIAGNOSIS — Z79899 Other long term (current) drug therapy: Secondary | ICD-10-CM | POA: Insufficient documentation

## 2016-05-15 DIAGNOSIS — S93402A Sprain of unspecified ligament of left ankle, initial encounter: Secondary | ICD-10-CM | POA: Diagnosis not present

## 2016-05-15 DIAGNOSIS — Y9301 Activity, walking, marching and hiking: Secondary | ICD-10-CM | POA: Insufficient documentation

## 2016-05-15 DIAGNOSIS — W1839XA Other fall on same level, initial encounter: Secondary | ICD-10-CM | POA: Diagnosis not present

## 2016-05-15 DIAGNOSIS — Y92000 Kitchen of unspecified non-institutional (private) residence as  the place of occurrence of the external cause: Secondary | ICD-10-CM | POA: Diagnosis not present

## 2016-05-15 DIAGNOSIS — S99912A Unspecified injury of left ankle, initial encounter: Secondary | ICD-10-CM | POA: Diagnosis present

## 2016-05-15 DIAGNOSIS — R55 Syncope and collapse: Secondary | ICD-10-CM | POA: Diagnosis not present

## 2016-05-15 DIAGNOSIS — Y999 Unspecified external cause status: Secondary | ICD-10-CM | POA: Diagnosis not present

## 2016-05-15 NOTE — ED Provider Notes (Signed)
[facility] Emergency Department Provider Note  ____________________________________________  Time seen: Approximately 1:09 PM  I have reviewed the triage vital signs and the nursing notes.   HISTORY  Chief Complaint Ankle Pain    HPI Maria Hodge is a 55 y.o. female , NAD, presents to emergency room with three-day history of left ankle pain, swelling, bruising. Patient states she was walking from her kitchen to her living room to sit down when she fell. Patient does not remember the fall. She states she was walking towards the chair and then she was on the floor. Her husband witnessed the incident and told his wife that the she did not have any trembling or shaking. He stated that her fall was broken by the cushions of the chair. She did not hit her head or neck on any furniture or the ground. Patient denies any dizziness, visual changes, chest pain, shortness breath, palpitations, abdominal pain, nausea, vomiting prior to or since the incident occurred. The only complaint she has had since the incident is left ankle pain, swelling and bruising. His not noted any open wounds or laceration. Has not taken anything over-the-counter for pain or swelling. Denies numbness, weakness, tingling. Has been able to bear weight but with pain.   Past Medical History:  Diagnosis Date  . Anxiety   . Arrhythmia   . Depression   . Heart murmur     There are no active problems to display for this patient.   Past Surgical History:  Procedure Laterality Date  . BACK SURGERY    . 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 Authorizing Provider  amitriptyline (ELAVIL) 75 MG tablet TAKE 1 TABLET (75 MG TOTAL) BY MOUTH AT BEDTIME. 02/18/16   Audery Amel, MD  FLUARIX QUADRIVALENT 0.5 ML injection TO BE ADMINISTERED BY PHARMACIST FOR IMMUNIZATION 05/17/15   Historical Provider, MD  ondansetron (ZOFRAN ODT) 4 MG disintegrating tablet Take 1 tablet (4 mg total) by mouth every 6 (six) hours as needed for nausea or vomiting. 05/03/16   Sharyn Creamer, MD  oxyCODONE (OXY IR/ROXICODONE) 5 MG immediate release tablet Take 1 tablet (5 mg total) by mouth every 6 (six) hours as needed for severe pain. 05/03/16   Sharyn Creamer, MD  QUEtiapine (SEROQUEL) 200 MG tablet Take 1 tablet (200 mg total) by mouth 4 (four) times daily. 02/18/16   Audery Amel, MD    Allergies Penicillins; Vicodin [hydrocodone-acetaminophen]; Aspirin; and Hydrocodone-acetaminophen  Family History  Problem Relation Age of Onset  . Heart failure Mother   . Hypertension Mother   . Depression Mother   . Anxiety disorder Mother   . Cancer Father   . Rheum arthritis Father   . Heart failure Sister   . Rheum arthritis Sister   . Heart failure Sister   . Cancer Sister     Social History Social History  Substance Use Topics  . Smoking status: Current Every Day Smoker    Packs/day: 1.00    Types: Cigarettes    Start date: 06/05/1992  . Smokeless tobacco: Never Used  . Alcohol use No     Review of Systems  Constitutional: No fever/chills,  Fatigue Eyes: No visual changes.  Cardiovascular: No chest pain, palpitations. Respiratory: No shortness of breath. No wheezing.  Gastrointestinal: No abdominal pain.  No nausea, vomiting.   Musculoskeletal: Positive left ankle pain. Negative for back, neck, hip pain.  Skin: Positive bruising of left foot and ankle. Negative for rash, redness, swelling, abnormal warmth, skin sores. Neurological: Positive syncope 1. Negative for headaches, focal weakness or numbness. No tingling, saddle paresthesias, loss of  bowel or bladder control. No dizziness, tremors 10-point ROS otherwise negative.  ____________________________________________   PHYSICAL EXAM:  VITAL SIGNS: ED Triage Vitals [05/15/16 1150]  Enc Vitals Group     BP      Pulse      Resp      Temp      Temp src      SpO2      Weight 135 lb (61.2 kg)     Height 5\' 4"  (1.626 m)     Head Circumference      Peak Flow      Pain Score 8     Pain Loc      Pain Edu?      Excl. in GC?      Constitutional: Alert and oriented. Well appearing and in no acute distress. Eyes: Conjunctivae are normal. PERRLA. EOMI without pain.  Head: Atraumatic. Neck: Supple with full range of motion. Cardiovascular: Good peripheral circulation with 2+ pulses noted in the left lower extremity. Capillary refill is brisk in all digits of the left foot. Respiratory: Normal respiratory effort without tachypnea or retractions.  Musculoskeletal: Full range of motion of the left ankle, foot and toes but pain noted with full flexion or extension. No laxity with anterior or posterior drawer. No crepitus or bony abnormality noted to palpation of the left ankle or foot. No lower extremity tenderness nor edema.  No joint effusions. Neurologic:  Normal speech and language. No gross focal neurologic deficits are appreciated. Posture is normal. Gait with a limp favoring the left leg. Skin:  Blue/black ecchymosis noted about the left foot laterally and medially along the superior surface of the sole. Swelling noted about the lateral left ankle. Skin is warm, dry and intact. No rash, redness, abnormal warmth, open wounds noted. Psychiatric: Mood and affect are normal. Speech and behavior are normal. Patient exhibits appropriate insight and judgement.   ____________________________________________   LABS  None ____________________________________________  EKG  None ____________________________________________  RADIOLOGY I have personally viewed and evaluated  these images (plain radiographs) as part of my medical decision making, as well as reviewing the written report by the radiologist.  Dg Ankle Complete Left  Result Date: 05/15/2016 CLINICAL DATA:  55 year old who fell earlier today and injured the left ankle. Lateral pain and swelling. Initial encounter. EXAM: LEFT ANKLE COMPLETE - 3+ VIEW COMPARISON:  None. FINDINGS: Lateral soft tissue swelling. No evidence of acute fracture. Ankle mortise intact with well-preserved joint space. Well-preserved bone mineral density. No intrinsic osseous abnormalities. No visible joint effusion. IMPRESSION: No osseous abnormality. Electronically Signed   By: Hulan Saas M.D.   On: 05/15/2016 13:00    ____________________________________________    PROCEDURES  Procedure(s) performed: None   Procedures   Medications - No data to display   ____________________________________________   INITIAL IMPRESSION / ASSESSMENT AND PLAN / ED COURSE  Pertinent labs & imaging results that were available during my care of the patient were reviewed by me and considered in my medical decision making (see chart for details).  Clinical Course  Comment By Time  Patient was offered workup of syncope episode from 3 days ago and she declined. States that has felt at her baseline without any acute symptoms other than her left ankle pain since the incident. Considering she denies head injury, LOC, dizziness, visual changes, chest pain, palpations, shortness breath, abdominal pain, neck or back pain, I agreed to defer further workup of syncope at this time. Patient verbalized understanding that if she has any further episodes of syncope or symptoms as listed above she is to follow up with her primary care provider or seek emergency medical care. Hope PigeonJami L Xavien Dauphinais, PA-C 09/15 1315  I spoke with the patient in regards to her physical exam, imaging results. Offered to place an Ace wrap around the left ankle as well as give the  patient crutches to limit weightbearing so that her ankle could improve. Patient initially agreed to such. As we were talking about medication options for swelling and bruising the patient noted that acetaminophen, Vicodin, Naprosyn, ibuprofen and aspirin all caused her to have severe reflux. She states she only tolerates oxycodone 5 mg tablets and is the only medicine that helps her with any pain. She states that she received a prescription for oxycodone 5 mg from a physician in the emergency department recently. I discussed with the patient that I would not prescribe oxycodone for her current ankle sprain. Patient stated that if I wasn't going to give her pain medication that there was no reason for her to stay. Patient was then asked to remain in the emergency department so that an Ace wrap and crutches to be given to her and she declined stating that she needed her pain treated and did not need the crutches. Hope PigeonJami L Kynlie Jane, PA-C 09/15 1321    Patient's diagnosis is consistent with Left ankle sprain. Patient will be discharged home with instructions to take over-the-counter medications as she tolerates to treat pain and swelling. Patient is to apply ice to the affected area 20 minutes 3-4 times daily and keep it elevated when not ambulating. Patient is to follow up with orthopedics or her primary care provider if symptoms persist past this treatment course. Patient is given ED precautions to return to the ED for any worsening or new symptoms.   After above discharge instructions were discussed with the patient, she left AGAINST MEDICAL ADVICE and prior to receiving Ace wrap and crutches as previously discussed. Patient was upset that she would not be receiving a prescription for oxycodone today.   ____________________________________________  FINAL CLINICAL IMPRESSION(S) / ED DIAGNOSES  Final diagnoses:  Left ankle sprain, initial encounter      NEW MEDICATIONS STARTED DURING THIS  VISIT:  Discharge Medication List as of 05/15/2016  1:42 PM           Hope PigeonJami L Emersen Carroll, PA-C 05/15/16 1348    Jeanmarie PlantJames A McShane, MD 05/15/16 1534

## 2016-05-15 NOTE — ED Triage Notes (Signed)
Pt with left ankle pain.  

## 2016-06-02 ENCOUNTER — Telehealth: Payer: Self-pay

## 2016-06-02 NOTE — Telephone Encounter (Signed)
left message that it was ok for a 90 day supply with no additional refill.

## 2016-06-02 NOTE — Telephone Encounter (Signed)
received a request for a 90 day supply for the seroquel

## 2016-08-18 ENCOUNTER — Ambulatory Visit: Payer: 59 | Admitting: Psychiatry

## 2016-09-21 ENCOUNTER — Other Ambulatory Visit: Payer: Self-pay | Admitting: Psychiatry

## 2016-09-21 ENCOUNTER — Telehealth: Payer: Self-pay

## 2016-09-21 NOTE — Telephone Encounter (Signed)
left message on doctor's line rx for seroquel 200mg  take 1 tablet by mouth 4 times daily with no additional refills

## 2016-09-21 NOTE — Telephone Encounter (Signed)
pt called states she will not have enough medication to get to her next appt. pt also has a new phone number.

## 2016-10-06 ENCOUNTER — Encounter: Payer: Self-pay | Admitting: Psychiatry

## 2016-10-06 ENCOUNTER — Ambulatory Visit (INDEPENDENT_AMBULATORY_CARE_PROVIDER_SITE_OTHER): Payer: 59 | Admitting: Psychiatry

## 2016-10-06 VITALS — BP 131/85 | HR 114 | Temp 97.4°F | Wt 144.8 lb

## 2016-10-06 DIAGNOSIS — F411 Generalized anxiety disorder: Secondary | ICD-10-CM | POA: Diagnosis not present

## 2016-10-06 DIAGNOSIS — F3162 Bipolar disorder, current episode mixed, moderate: Secondary | ICD-10-CM | POA: Diagnosis not present

## 2016-10-06 MED ORDER — QUETIAPINE FUMARATE 400 MG PO TABS: 400.0000 mg | ORAL_TABLET | Freq: Two times a day (BID) | ORAL | 5 refills | Status: DC

## 2016-10-06 MED ORDER — AMITRIPTYLINE HCL 150 MG PO TABS: ORAL_TABLET | ORAL | 5 refills | Status: DC

## 2017-02-07 ENCOUNTER — Other Ambulatory Visit: Payer: Self-pay | Admitting: Psychiatry

## 2017-03-15 ENCOUNTER — Other Ambulatory Visit: Payer: Self-pay | Admitting: Psychiatry

## 2017-04-05 ENCOUNTER — Ambulatory Visit: Payer: Medicare Other | Admitting: Psychiatry

## 2017-06-03 ENCOUNTER — Encounter: Payer: Self-pay | Admitting: Psychiatry

## 2017-06-03 ENCOUNTER — Ambulatory Visit (INDEPENDENT_AMBULATORY_CARE_PROVIDER_SITE_OTHER): Payer: Medicare Other | Admitting: Psychiatry

## 2017-06-03 ENCOUNTER — Telehealth: Payer: Self-pay

## 2017-06-03 VITALS — BP 143/98 | HR 114 | Temp 98.5°F | Wt 138.8 lb

## 2017-06-03 DIAGNOSIS — F3162 Bipolar disorder, current episode mixed, moderate: Secondary | ICD-10-CM | POA: Diagnosis not present

## 2017-06-03 DIAGNOSIS — F411 Generalized anxiety disorder: Secondary | ICD-10-CM | POA: Diagnosis not present

## 2017-06-03 MED ORDER — BUPROPION HCL ER (XL) 150 MG PO TB24: 150.0000 mg | ORAL_TABLET | Freq: Every day | ORAL | 1 refills | Status: DC

## 2017-06-03 MED ORDER — QUETIAPINE FUMARATE 400 MG PO TABS: 400.0000 mg | ORAL_TABLET | Freq: Two times a day (BID) | ORAL | 5 refills | Status: DC

## 2017-06-03 MED ORDER — AMITRIPTYLINE HCL 150 MG PO TABS: ORAL_TABLET | ORAL | 5 refills | Status: DC

## 2017-06-03 NOTE — Progress Notes (Signed)
Follow-up for this 56 year old woman with chronic depression and anxiety. She tells me that since I saw her last time her daughter-in-law died of suicide. She and her husband are both feeling more depressed and upset about it. She says she is feeling more down and depressed. Low energy. Not sleeping well. Denies any suicidal thoughts and denies any psychosis.  Neatly groomed woman looks her stated age. Normal eye contact. Speech normal in rate and tone. Thoughts lucid. We reviewed her medicine and dosage. 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 No change to the [place]. Increase Seroquel to 600 at night for sleep and depression and add bupropion 150 mg a day for depression. Follow-up in another month.

## 2017-06-03 NOTE — Telephone Encounter (Signed)
pharmacy called states that patient came by pharmacy for her medications and there is nothing sent over. pt was seen today

## 2017-06-18 ENCOUNTER — Emergency Department: Payer: Medicare Other

## 2017-06-18 ENCOUNTER — Emergency Department
Admission: EM | Admit: 2017-06-18 | Discharge: 2017-06-18 | Disposition: A | Discharge status: Home / Self Care | Payer: Medicare Other | Attending: Emergency Medicine | Admitting: Emergency Medicine

## 2017-06-18 ENCOUNTER — Encounter: Payer: Self-pay | Admitting: Emergency Medicine

## 2017-06-18 DIAGNOSIS — S2242XA Multiple fractures of ribs, left side, initial encounter for closed fracture: Secondary | ICD-10-CM | POA: Diagnosis not present

## 2017-06-18 DIAGNOSIS — R0602 Shortness of breath: Secondary | ICD-10-CM | POA: Diagnosis not present

## 2017-06-18 DIAGNOSIS — Y999 Unspecified external cause status: Secondary | ICD-10-CM | POA: Diagnosis not present

## 2017-06-18 DIAGNOSIS — J40 Bronchitis, not specified as acute or chronic: Secondary | ICD-10-CM | POA: Diagnosis not present

## 2017-06-18 DIAGNOSIS — Y929 Unspecified place or not applicable: Secondary | ICD-10-CM | POA: Insufficient documentation

## 2017-06-18 DIAGNOSIS — R05 Cough: Secondary | ICD-10-CM | POA: Insufficient documentation

## 2017-06-18 DIAGNOSIS — X500XXA Overexertion from strenuous movement or load, initial encounter: Secondary | ICD-10-CM | POA: Diagnosis not present

## 2017-06-18 DIAGNOSIS — Y9389 Activity, other specified: Secondary | ICD-10-CM | POA: Insufficient documentation

## 2017-06-18 DIAGNOSIS — F17210 Nicotine dependence, cigarettes, uncomplicated: Secondary | ICD-10-CM | POA: Diagnosis not present

## 2017-06-18 DIAGNOSIS — S299XXA Unspecified injury of thorax, initial encounter: Secondary | ICD-10-CM | POA: Diagnosis present

## 2017-06-18 HISTORY — DX: Essential (primary) hypertension: I10

## 2017-06-18 LAB — BASIC METABOLIC PANEL
ANION GAP: 9 (ref 5–15)
BUN: 8 mg/dL (ref 6–20)
CHLORIDE: 102 mmol/L (ref 101–111)
CO2: 27 mmol/L (ref 22–32)
Calcium: 9.3 mg/dL (ref 8.9–10.3)
Creatinine, Ser: 0.9 mg/dL (ref 0.44–1.00)
GFR calc Af Amer: >60 mL/min (ref >60)
GLUCOSE: 111 mg/dL — AB (ref 65–99)
POTASSIUM: 4.5 mmol/L (ref 3.5–5.1)
Sodium: 138 mmol/L (ref 135–145)

## 2017-06-18 LAB — CBC
HEMATOCRIT: 46.8 % (ref 35.0–47.0)
HEMOGLOBIN: 15.6 g/dL (ref 12.0–16.0)
MCH: 30.3 pg (ref 26.0–34.0)
MCHC: 33.4 g/dL (ref 32.0–36.0)
MCV: 90.8 fL (ref 80.0–100.0)
PLATELETS: 185 10*3/uL (ref 150–440)
RBC: 5.15 MIL/uL (ref 3.80–5.20)
RDW: 15.8 % — ABNORMAL HIGH (ref 11.5–14.5)
WBC: 12.2 10*3/uL — AB (ref 3.6–11.0)

## 2017-06-18 LAB — FIBRIN DERIVATIVES D-DIMER (ARMC ONLY): Fibrin derivatives D-dimer (ARMC): 864.22 ng/mL (FEU) — ABNORMAL HIGH (ref 0.00–499.00)

## 2017-06-18 LAB — TROPONIN I: Troponin I: <0.03 ng/mL (ref <0.03)

## 2017-06-18 MED ORDER — IOPAMIDOL (ISOVUE-370) INJECTION 76%
75.0000 mL | Freq: Once | INTRAVENOUS | Status: AC | PRN
  Administered 2017-06-18: 75 mL via INTRAVENOUS
  Filled 2017-06-18: qty 75

## 2017-06-18 MED ORDER — FAMOTIDINE 20 MG PO TABS
ORAL_TABLET | ORAL | Status: AC
  Administered 2017-06-18: 20 mg via ORAL
  Filled 2017-06-18: qty 1

## 2017-06-18 MED ORDER — FAMOTIDINE 20 MG PO TABS
20.0000 mg | ORAL_TABLET | Freq: Once | ORAL | Status: AC
  Administered 2017-06-18: 20 mg via ORAL

## 2017-06-18 MED ORDER — HYDROCOD POLST-CPM POLST ER 10-8 MG/5ML PO SUER
ORAL | Status: AC
  Administered 2017-06-18: 5 mL via ORAL
  Filled 2017-06-18: qty 5

## 2017-06-18 MED ORDER — ALBUTEROL SULFATE HFA 108 (90 BASE) MCG/ACT IN AERS: 2.0000 | INHALATION_SPRAY | Freq: Four times a day (QID) | RESPIRATORY_TRACT | 2 refills | Status: DC | PRN

## 2017-06-18 MED ORDER — PREDNISONE 10 MG (21) PO TBPK: ORAL_TABLET | Freq: Every day | ORAL | 0 refills | Status: DC

## 2017-06-18 MED ORDER — ALBUTEROL SULFATE (2.5 MG/3ML) 0.083% IN NEBU
5.0000 mg | INHALATION_SOLUTION | Freq: Once | RESPIRATORY_TRACT | Status: AC
  Administered 2017-06-18: 5 mg via RESPIRATORY_TRACT
  Filled 2017-06-18: qty 6

## 2017-06-18 MED ORDER — BENZONATATE 100 MG PO CAPS
200.0000 mg | ORAL_CAPSULE | Freq: Once | ORAL | Status: AC
Start: 2017-06-18 — End: 2017-06-18
  Administered 2017-06-18: 200 mg via ORAL
  Filled 2017-06-18 (×2): qty 2

## 2017-06-18 MED ORDER — AZITHROMYCIN 250 MG PO TABS: ORAL_TABLET | ORAL | 0 refills | Status: DC

## 2017-06-18 MED ORDER — HYDROCOD POLST-CPM POLST ER 10-8 MG/5ML PO SUER: 5.0000 mL | Freq: Two times a day (BID) | ORAL | 0 refills | Status: DC

## 2017-06-18 MED ORDER — HYDROCOD POLST-CPM POLST ER 10-8 MG/5ML PO SUER
5.0000 mL | Freq: Once | ORAL | Status: AC
  Administered 2017-06-18: 5 mL via ORAL
  Filled 2017-06-18: qty 5

## 2017-06-18 NOTE — ED Triage Notes (Signed)
Pt reports cough, shortness of breath and pain upon inspiration for over one week. Pt with congested sounding cough in triage. Pt states hurts on left side when she coughs or takes a deep breath. Pt with audible wheezing. Pt able to speak in complete sentences without difficulty. Room air SPO2 95%.
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
## 2017-06-28 ENCOUNTER — Other Ambulatory Visit: Payer: Self-pay | Admitting: Psychiatry

## 2017-07-01 ENCOUNTER — Ambulatory Visit: Payer: Medicare Other | Admitting: Psychiatry

## 2017-07-28 ENCOUNTER — Other Ambulatory Visit: Payer: Self-pay | Admitting: Psychiatry

## 2017-07-30 ENCOUNTER — Other Ambulatory Visit: Payer: Self-pay | Admitting: Psychiatry

## 2017-10-07 ENCOUNTER — Other Ambulatory Visit: Payer: Self-pay | Admitting: Psychiatry

## 2017-10-14 ENCOUNTER — Other Ambulatory Visit: Payer: Self-pay

## 2017-10-14 ENCOUNTER — Ambulatory Visit: Payer: Medicare Other | Admitting: Psychiatry

## 2017-10-14 ENCOUNTER — Encounter: Payer: Self-pay | Admitting: Psychiatry

## 2017-10-14 VITALS — BP 142/90 | HR 120 | Temp 97.8°F | Wt 141.6 lb

## 2017-10-14 DIAGNOSIS — F3162 Bipolar disorder, current episode mixed, moderate: Secondary | ICD-10-CM

## 2017-10-14 DIAGNOSIS — F411 Generalized anxiety disorder: Secondary | ICD-10-CM

## 2017-10-14 MED ORDER — BUPROPION HCL ER (XL) 150 MG PO TB24
150.0000 mg | ORAL_TABLET | Freq: Every day | ORAL | 5 refills | Status: DC
Start: 2017-10-14 — End: 2018-06-10

## 2017-10-14 MED ORDER — QUETIAPINE FUMARATE 400 MG PO TABS: 400.0000 mg | ORAL_TABLET | Freq: Two times a day (BID) | ORAL | 5 refills | Status: DC

## 2017-10-14 MED ORDER — AMITRIPTYLINE HCL 150 MG PO TABS: ORAL_TABLET | ORAL | 5 refills | Status: DC

## 2017-10-14 NOTE — Progress Notes (Signed)
Follow-up 57 year old woman with mood disorder and anxiety.  No new complaints.  Mood is not depressed.  She is feeling much better and more stable.  No suicidal ideation.  No psychosis.  Sleeping better at night on current doses of medicine.  Patient is neatly dressed and groomed.  Good eye contact.  Appropriate interaction.  Thoughts lucid.  Refill medication.  No sign of serotonin syndrome.  Reviewed utility of medication.  Follow-up 6 months.

## 2017-10-25 ENCOUNTER — Encounter: Payer: Self-pay | Admitting: Emergency Medicine

## 2017-10-25 ENCOUNTER — Emergency Department: Payer: Medicare Other

## 2017-10-25 ENCOUNTER — Other Ambulatory Visit: Payer: Self-pay

## 2017-10-25 ENCOUNTER — Emergency Department
Admission: EM | Admit: 2017-10-25 | Discharge: 2017-10-25 | Disposition: A | Discharge status: Home / Self Care | Payer: Medicare Other | Attending: Emergency Medicine | Admitting: Emergency Medicine

## 2017-10-25 DIAGNOSIS — I10 Essential (primary) hypertension: Secondary | ICD-10-CM | POA: Insufficient documentation

## 2017-10-25 DIAGNOSIS — Z79899 Other long term (current) drug therapy: Secondary | ICD-10-CM | POA: Insufficient documentation

## 2017-10-25 DIAGNOSIS — F17210 Nicotine dependence, cigarettes, uncomplicated: Secondary | ICD-10-CM | POA: Diagnosis not present

## 2017-10-25 DIAGNOSIS — S82852A Displaced trimalleolar fracture of left lower leg, initial encounter for closed fracture: Secondary | ICD-10-CM | POA: Diagnosis not present

## 2017-10-25 DIAGNOSIS — S300XXA Contusion of lower back and pelvis, initial encounter: Secondary | ICD-10-CM | POA: Insufficient documentation

## 2017-10-25 DIAGNOSIS — Y929 Unspecified place or not applicable: Secondary | ICD-10-CM | POA: Insufficient documentation

## 2017-10-25 DIAGNOSIS — M545 Low back pain: Secondary | ICD-10-CM | POA: Insufficient documentation

## 2017-10-25 DIAGNOSIS — S99912A Unspecified injury of left ankle, initial encounter: Secondary | ICD-10-CM | POA: Diagnosis present

## 2017-10-25 DIAGNOSIS — T148XXA Other injury of unspecified body region, initial encounter: Secondary | ICD-10-CM

## 2017-10-25 DIAGNOSIS — Y998 Other external cause status: Secondary | ICD-10-CM | POA: Diagnosis not present

## 2017-10-25 DIAGNOSIS — Y9301 Activity, walking, marching and hiking: Secondary | ICD-10-CM | POA: Insufficient documentation

## 2017-10-25 DIAGNOSIS — W010XXA Fall on same level from slipping, tripping and stumbling without subsequent striking against object, initial encounter: Secondary | ICD-10-CM | POA: Insufficient documentation

## 2017-10-25 MED ORDER — TRAMADOL HCL 50 MG PO TABS
50.0000 mg | ORAL_TABLET | Freq: Once | ORAL | Status: AC
  Administered 2017-10-25: 50 mg via ORAL
  Filled 2017-10-25: qty 1

## 2017-10-25 MED ORDER — FENTANYL CITRATE (PF) 100 MCG/2ML IJ SOLN
25.0000 ug | Freq: Once | INTRAMUSCULAR | Status: AC
Start: 2017-10-25 — End: 2017-10-25
  Administered 2017-10-25: 25 ug via INTRAMUSCULAR
  Filled 2017-10-25: qty 2

## 2017-10-25 MED ORDER — OXYCODONE-ACETAMINOPHEN 5-325 MG PO TABS: 1.0000 | ORAL_TABLET | Freq: Four times a day (QID) | ORAL | 0 refills | Status: AC | PRN

## 2017-10-25 NOTE — ED Triage Notes (Signed)
L foot pain since slid and twisted yesterday.

## 2017-10-25 NOTE — ED Provider Notes (Signed)
[facility] Emergency Department Provider Note  ____________________________________________  Time seen: Approximately 3:30 PM  I have reviewed the triage vital signs and the nursing notes.   HISTORY  Chief Complaint Foot Pain    HPI Maria Hodge is a 57 y.o. female that presents emergency department for evaluation of low back pain and left foot pain after falling yesterday morning.  Patient states that she is walking on uneven terrain when she fell.  Her left foot went under her right foot and she landed on her low back.  She has not been able to bear weight since fall.  She did not hit her head or lose consciousness.  No  bowel or bladder dysfunction or saddle paresthesias.  No numbness, tingling.   Past Medical History:  Diagnosis Date  . Anxiety   . Arrhythmia   . Depression   . Heart murmur   . Hypertension     There are no active problems to display for this patient.   Past Surgical History:  Procedure Laterality Date  . BACK SURGERY    . 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 Authorizing Provider  albuterol (PROVENTIL HFA;VENTOLIN HFA) 108 (90 Base) MCG/ACT inhaler Inhale 2 puffs into the lungs every 6 (six) hours as needed for wheezing or shortness of breath. 06/18/17   Emily Filbert, MD  amitriptyline (ELAVIL) 150 MG tablet 150mg  qhs 10/14/17   Clapacs, Jackquline Denmark, MD  azithromycin (ZITHROMAX Z-PAK) 250 MG tablet Take 2 tablets (500 mg) on  Day 1,  followed by 1 tablet (250 mg) once daily on Days 2 through 5. 06/18/17   Emily Filbert, MD  buPROPion (WELLBUTRIN XL) 150 MG  24 hr tablet Take 1 tablet (150 mg total) by mouth daily. 10/14/17   Clapacs, Jackquline Denmark, MD  chlorpheniramine-HYDROcodone (TUSSIONEX PENNKINETIC ER) 10-8 MG/5ML SUER Take 5 mLs by mouth 2 (two) times daily. 06/18/17   Emily Filbert, MD  oxyCODONE-acetaminophen (PERCOCET) 5-325 MG tablet Take 1 tablet by mouth every 6 (six) hours as needed for up to 3 days for severe pain. 10/25/17 10/28/17  Enid Derry, PA-C  predniSONE (STERAPRED UNI-PAK 21 TAB) 10 MG (21) TBPK tablet Take by mouth daily. dispense steroid taper pack as directed 06/18/17   Emily Filbert, MD  QUEtiapine (SEROQUEL) 400 MG tablet Take 1 tablet (400 mg total) by mouth 2 (two) times daily. 10/14/17   Clapacs, Jackquline Denmark, MD    Allergies Penicillins; Aspirin; Codeine; and Vicodin [hydrocodone-acetaminophen]  Family History  Problem Relation Age of Onset  . Heart failure Mother   . Hypertension Mother   . Depression Mother   . Anxiety disorder Mother   . Cancer Father   . Rheum arthritis Father   . Heart failure Sister   . Rheum arthritis Sister   . Heart failure Sister   . Cancer Sister     Social History Social History   Tobacco Use  . Smoking status: Current Every Day Smoker    Packs/day: 0.50    Types: Cigarettes    Start date: 06/05/1992  . Smokeless tobacco: Never Used  Substance Use Topics  . Alcohol use: No  Alcohol/week: 0.0 oz  . Drug use: No     Review of Systems  Constitutional: No fever/chills Cardiovascular: No chest pain. Respiratory: No SOB. Gastrointestinal: No abdominal pain.  No nausea, no vomiting.  Musculoskeletal: Positive for low back pain and ankle pain. Skin: Negative for rash, abrasions, lacerations, ecchymosis.   ____________________________________________   PHYSICAL EXAM:  VITAL SIGNS: ED Triage Vitals  Enc Vitals Group     BP 10/25/17 1216 135/72     Pulse Rate 10/25/17 1216 (!) 120     Resp 10/25/17 1216 20     Temp 10/25/17 1216 97.7 F (36.5 C)     Temp  Source 10/25/17 1216 Oral     SpO2 10/25/17 1216 94 %     Weight 10/25/17 1217 138 lb (62.6 kg)     Height 10/25/17 1217 5\' 5"  (1.651 m)     Head Circumference --      Peak Flow --      Pain Score 10/25/17 1217 8     Pain Loc --      Pain Edu? --      Excl. in GC? --      Constitutional: Alert and oriented. Well appearing and in no acute distress. Eyes: Conjunctivae are normal. PERRL. EOMI. Head: Atraumatic. ENT:      Ears:      Nose: No congestion/rhinnorhea.      Mouth/Throat: Mucous membranes are moist.  Neck: No stridor. Cardiovascular: Normal rate, regular rhythm.  Good peripheral circulation.  Symmetric dorsalis pedis pulses bilaterally. Respiratory: Normal respiratory effort without tachypnea or retractions. Lungs CTAB. Good air entry to the bases with no decreased or absent breath sounds. Gastrointestinal: Bowel sounds 4 quadrants. Soft and nontender to palpation. No guarding or rigidity. No palpable masses. No distention. Musculoskeletal: Full range of motion to all extremities. No gross deformities appreciated.  No pinpoint tenderness to palpation over lumbar spine.  Limited range of motion of left ankle.  Moderate swelling over lateral malleolus. Neurologic:  Normal speech and language. No gross focal neurologic deficits are appreciated.   Skin:  Skin is warm, dry and intact.  Moderate bruising throughout lower back. Psychiatric: Mood and affect are normal. Speech and behavior are normal. Patient exhibits appropriate insight and judgement.  ____________________________________________   LABS (all labs ordered are listed, but only abnormal results are displayed)  Labs Reviewed - No data to display ____________________________________________  EKG   ____________________________________________  RADIOLOGY Lexine BatonI, Aseem Sessums, personally viewed and evaluated these images (plain radiographs) as part of my medical decision making, as well as reviewing the written report  by the radiologist.  Dg Lumbar Spine Complete  Result Date: 10/25/2017 CLINICAL DATA:  Low back pain after fall yesterday. EXAM: LUMBAR SPINE - COMPLETE 4+ VIEW COMPARISON:  CT scan May 03, 2016. 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 Moderate degenerative disc disease is noted at L2-3 with anterior osteophyte formation. No fracture or significant spondylolisthesis is noted. IMPRESSION: Postoperative and degenerative changes as described above. No acute abnormality seen in the lumbar spine. Electronically Signed   By: Lupita RaiderJames  Green Jr, M.D.   On: 10/25/2017 15:19   Dg Sacrum/coccyx  Result Date: 10/25/2017 CLINICAL DATA:  Low back pain after fall. EXAM: SACRUM AND COCCYX - 2+ VIEW COMPARISON:  CT scan of May 03, 2016. FINDINGS: There is no evidence of fracture or other focal bone lesions. IMPRESSION: No significant abnormality seen in the sacrum or coccyx. Electronically Signed  By: Lupita Raider, M.D.   On: 10/25/2017 15:21   Dg Ankle 2 Views Left  Result Date: 10/25/2017 CLINICAL DATA:  Fracture EXAM: LEFT ANKLE - 2 VIEW COMPARISON:  10/25/2017 FINDINGS: Tiny avulsion adjacent to the medial malleolus. Distal fibular fracture noted but difficult to appreciate due to overlap with the tibia. Ossicle or avulsion adjacent to the fibular malleolus. IMPRESSION: 1. Tiny avulsion fracture adjacent to the medial malleolus 2. Distal fibular fracture Electronically Signed   By: Jasmine Pang M.D.   On: 10/25/2017 16:05   Dg Ankle Complete Left  Result Date: 10/25/2017 CLINICAL DATA:  Slipped in mud yesterday morning, LEFT foot and ankle pain, injury EXAM: LEFT ANKLE COMPLETE - 3+ VIEW COMPARISON:  05/15/2016 FINDINGS: Diffuse soft tissue swelling lower LEFT leg and ankle into foot. Osseous mineralization normal. Joint spaces preserved. Long oblique fracture of distal LEFT fibula without significant displacement. Tiny avulsion fracture  at tip of medial malleolus. Nondisplaced fracture at posterior margin of distal tibia. Additional vague calcific density at the lateral margin of the talus, question accessory ossicle versus a tiny avulsion fragment No dislocation. IMPRESSION: Trimalleolar fracture LEFT ankle. Questionable avulsion fragment versus accessory ossicle at lateral margin of talus. Electronically Signed   By: Ulyses Southward M.D.   On: 10/25/2017 13:07   Dg Foot Complete Left  Result Date: 10/25/2017 CLINICAL DATA:  Left foot pain after slipping in the mud yesterday. EXAM: LEFT FOOT - COMPLETE 3+ VIEW COMPARISON:  None. FINDINGS: Partially visualized oblique fracture through the distal fibula. No foot fracture. There is no evidence of arthropathy or other focal bone abnormality. Os peroneum. Soft tissues are unremarkable. IMPRESSION: No foot fracture. Partially visualized oblique fracture through the distal fibula. Please see dedicated ankle x-rays for further evaluation. Electronically Signed   By: Obie Dredge M.D.   On: 10/25/2017 13:11    ____________________________________________    PROCEDURES  Procedure(s) performed:    Procedures    Medications  traMADol (ULTRAM) tablet 50 mg (50 mg Oral Given 10/25/17 1543)  fentaNYL (SUBLIMAZE) injection 25 mcg (25 mcg Intramuscular Given 10/25/17 1542)  traMADol (ULTRAM) tablet 50 mg (50 mg Oral Given 10/25/17 1626)     ____________________________________________   INITIAL IMPRESSION / ASSESSMENT AND PLAN / ED COURSE  Pertinent labs & imaging results that were available during my care of the patient were reviewed by me and considered in my medical decision making (see chart for details).  Review of the [place] CSRS was performed in accordance of the [facility] prior to dispensing any controlled drugs.   Patient presented to the emergency department for evaluation after fall.  Vital signs and exam are reassuring.  No acute fracture on lumbar or coccyx x-ray.  Ankle x-ray  consistent with trimalleolar fracture.  Dr. Allena Katz was consulted and recommended external rotation stress fracture image.  Dr. Allena Katz came to see the patient and replaced splint.  Plan is for her to follow up in clinic this week with him and to have surgery on the 11th.  Patient will be discharged home with prescriptions for Percocet.  Computer states that she is allergic to Percocet but patient states that she is not and has taken it without difficulty.  Patient is to follow up with orthopedics as directed. Patient is given ED precautions to return to the ED for any worsening or new symptoms.     ____________________________________________  FINAL CLINICAL IMPRESSION(S) / ED DIAGNOSES  Final diagnoses:  Fracture  Closed trimalleolar fracture of left ankle,  initial encounter  Contusion of lower back, initial encounter      NEW MEDICATIONS STARTED DURING THIS VISIT:  ED Discharge Orders        Ordered    oxyCODONE-acetaminophen (PERCOCET) 5-325 MG tablet  Every 6 hours PRN     10/25/17 1704          This chart was dictated using voice recognition software/Dragon. Despite best efforts to proofread, errors can occur which can change the meaning. Any change was purely unintentional.    Enid Derry, PA-C 10/25/17 1730    Rockne Menghini, MD 10/26/17 1030

## 2017-10-25 NOTE — Consult Note (Addendum)
ORTHOPAEDIC CONSULTATION  REQUESTING PHYSICIAN: No att. providers found  Chief Complaint:   L ankle pain  History of Present Illness: Maria Hodge is a 57 y.o. female who had a slip and fall on mud yesterday morning. She noted back and L foot pain. She has not been able to put any weight on her LLE. Swelling increased dramatically today so she presents to ED for evaluation. Radiographs show trimalleolar ankle fracture. Remainder of radiographs negative. Pain is located mostly over the lateral aspect of the ankle and less so medially. Pain is rated a 6/10 at rest. Pain is worse with any movement and attempted weight-bearing. Pain is improved at rest.   History obtained from patient primarily, husband at bedside also assisted with history.   She notes she smokes ~1/2 ppd but she is trying to quit with her husband.  Past Medical History:  Diagnosis Date  . Anxiety   . Arrhythmia   . Depression   . Heart murmur   . Hypertension    Past Surgical History:  Procedure Laterality Date  . BACK SURGERY    . STERIOD INJECTION  12/21/2011   Procedure: MINOR STEROID INJECTION;  Surgeon: Mat CarneSydney Michael Tooke, MD;  Location: [facility];  Service: Orthopedics;  Laterality: Right;  Right L3-4 transforaminal epidural steroid injection, attempted right L4-5 epidural steroid injection( 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)   Social History   Socioeconomic History  . Marital status: Married    Spouse name: None  . Number of children: None  . Years of education: None  . Highest education level: None  Social Needs  . Financial resource strain: None  . Food insecurity - worry: None  . Food insecurity - inability: None  . Transportation needs - medical: None  . Transportation needs - non-medical: None  Occupational History  . None  Tobacco Use  . Smoking status: Current Every Day Smoker    Packs/day: 0.50    Types:  Cigarettes    Start date: 06/05/1992  . Smokeless tobacco: Never Used  Substance and Sexual Activity  . Alcohol use: No    Alcohol/week: 0.0 oz  . Drug use: No  . Sexual activity: Yes    Birth control/protection: None  Other Topics Concern  . None  Social History Narrative  . None   Family History  Problem Relation Age of Onset  . Heart failure Mother   . Hypertension Mother   . Depression Mother   . Anxiety disorder Mother   . Cancer Father   . Rheum arthritis Father   . Heart failure Sister   . Rheum arthritis Sister   . Heart failure Sister   . Cancer Sister    Allergies  Allergen Reactions  . Penicillins Swelling  . Aspirin Other (See Comments)    Gastritis  . Codeine Hives  . Vicodin [Hydrocodone-Acetaminophen] Nausea Only   Prior to Admission medications   Medication Sig Start Date End Date Taking? Authorizing Provider  albuterol (PROVENTIL HFA;VENTOLIN HFA) 108 (90 Base) MCG/ACT inhaler Inhale 2 puffs into the lungs every 6 (six) hours as needed for wheezing or shortness of breath. 06/18/17   Emily FilbertWilliams, Jonathan E, MD  amitriptyline (ELAVIL) 150 MG tablet 150mg  qhs 10/14/17   Clapacs, Jackquline DenmarkJohn T, MD  azithromycin (ZITHROMAX Z-PAK) 250 MG tablet Take 2 tablets (500 mg) on  Day 1,  followed by 1 tablet (250 mg) once daily on Days 2 through 5. 06/18/17   Emily FilbertWilliams, Jonathan E, MD  buPROPion (  WELLBUTRIN XL) 150 MG 24 hr tablet Take 1 tablet (150 mg total) by mouth daily. 10/14/17   Clapacs, Jackquline Denmark, MD  chlorpheniramine-HYDROcodone (TUSSIONEX PENNKINETIC ER) 10-8 MG/5ML SUER Take 5 mLs by mouth 2 (two) times daily. 06/18/17   Emily Filbert, MD  oxyCODONE-acetaminophen (PERCOCET) 5-325 MG tablet Take 1 tablet by mouth every 6 (six) hours as needed for up to 3 days for severe pain. 10/25/17 10/28/17  Enid Derry, PA-C  predniSONE (STERAPRED UNI-PAK 21 TAB) 10 MG (21) TBPK tablet Take by mouth daily. dispense steroid taper pack as directed 06/18/17   Emily Filbert, MD   QUEtiapine (SEROQUEL) 400 MG tablet Take 1 tablet (400 mg total) by mouth 2 (two) times daily. 10/14/17   Clapacs, Jackquline Denmark, MD   No results for input(s): WBC, HGB, HCT, PLT, K, CL, CO2, BUN, CREATININE, GLUCOSE, CALCIUM, LABPT, INR in the last 72 hours. Dg Lumbar Spine Complete  Result Date: 10/25/2017 CLINICAL DATA:  Low back pain after fall yesterday. EXAM: LUMBAR SPINE - COMPLETE 4+ VIEW COMPARISON:  CT scan May 03, 2016. 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 Moderate degenerative disc disease is noted at L2-3 with anterior osteophyte formation. No fracture or significant spondylolisthesis is noted. IMPRESSION: Postoperative and degenerative changes as described above. No acute abnormality seen in the lumbar spine. Electronically Signed   By: Lupita Raider, M.D.   On: 10/25/2017 15:19   Dg Sacrum/coccyx  Result Date: 10/25/2017 CLINICAL DATA:  Low back pain after fall. EXAM: SACRUM AND COCCYX - 2+ VIEW COMPARISON:  CT scan of May 03, 2016. FINDINGS: There is no evidence of fracture or other focal bone lesions. IMPRESSION: No significant abnormality seen in the sacrum or coccyx. Electronically Signed   By: Lupita Raider, M.D.   On: 10/25/2017 15:21   Dg Ankle 2 Views Left  Result Date: 10/25/2017 CLINICAL DATA:  Fracture EXAM: LEFT ANKLE - 2 VIEW COMPARISON:  10/25/2017 FINDINGS: Tiny avulsion adjacent to the medial malleolus. Distal fibular fracture noted but difficult to appreciate due to overlap with the tibia. Ossicle or avulsion adjacent to the fibular malleolus. IMPRESSION: 1. Tiny avulsion fracture adjacent to the medial malleolus 2. Distal fibular fracture Electronically Signed   By: Jasmine Pang M.D.   On: 10/25/2017 16:05   Dg Ankle Complete Left  Result Date: 10/25/2017 CLINICAL DATA:  Slipped in mud yesterday morning, LEFT foot and ankle pain, injury EXAM: LEFT ANKLE COMPLETE - 3+ VIEW COMPARISON:   05/15/2016 FINDINGS: Diffuse soft tissue swelling lower LEFT leg and ankle into foot. Osseous mineralization normal. Joint spaces preserved. Long oblique fracture of distal LEFT fibula without significant displacement. Tiny avulsion fracture at tip of medial malleolus. Nondisplaced fracture at posterior margin of distal tibia. Additional vague calcific density at the lateral margin of the talus, question accessory ossicle versus a tiny avulsion fragment No dislocation. IMPRESSION: Trimalleolar fracture LEFT ankle. Questionable avulsion fragment versus accessory ossicle at lateral margin of talus. Electronically Signed   By: Ulyses Southward M.D.   On: 10/25/2017 13:07   Dg Foot Complete Left  Result Date: 10/25/2017 CLINICAL DATA:  Left foot pain after slipping in the mud yesterday. EXAM: LEFT FOOT - COMPLETE 3+ VIEW COMPARISON:  None. FINDINGS: Partially visualized oblique fracture through the distal fibula. No foot fracture. There is no evidence of arthropathy or other focal bone abnormality. Os peroneum. Soft tissues are unremarkable. IMPRESSION: No foot fracture. Partially visualized oblique fracture through  the distal fibula. Please see dedicated ankle x-rays for further evaluation. Electronically Signed   By: Obie Dredge M.D.   On: 10/25/2017 13:11     Positive ROS: All other systems have been reviewed and were otherwise negative with the exception of those mentioned in the HPI and as above.  Physical Exam: BP 130/70 (BP Location: Right Arm)   Pulse 90   Temp 97.7 F (36.5 C) (Oral)   Resp 20   Ht 5\' 5"  (1.651 m)   Wt 62.6 kg (138 lb)   SpO2 95%   BMI 22.96 kg/m  General:  Alert, no acute distress Psychiatric:  Patient is competent for consent with normal mood and affect   Cardiovascular:  No pedal edema, regular rate and rhythm Respiratory:  No wheezing, non-labored breathing GI:  Abdomen is soft and non-tender Skin:  No lesions in the area of chief complaint, no erythema Neurologic:   Sensation intact distally, CN grossly intact Lymphatic:  No axillary or cervical lymphadenopathy  Orthopedic Exam:  LLE: + DF/PF/EHL SILT s/s/t/sp/dp distr Foot wwp Significant soft tissue swelling laterally and over dorsum of foot. Mild bruising posterolaterally about ankle  X-rays:  As above: trimalleolar ankle fracture with long distal fibula fracture, posterior malleolar fracture, and medial malleolus avulsion fracture.   Assessment/Plan: 57 yo F w/L trimalleolar ankle fracture sustained 10/24/17 in the AM.  1. We discussed both surgical and non-surgical management options of this injury. After discussion of risks, benefits, and alternatives to surgery, the patient elected to proceed with surgical intervention given that this is an unstable injury pattern. We did discuss that she was at higher risk of postoperative complication given that she smokes. She stated she would attempt to quit smoking. Given the amount of soft-tissue swelling present currently, we will not plan for acute fixation of the injury. She will follow up in clinic with me in ~1 week. We will check her soft tissue swelling at that time. We will plan for surgery on 11/09/17. Patient in agreement with plan for follow up and surgery. She was instructed to keep leg elevated above level of heart as much as possible to help with swelling.  2. Discharge with PO pain meds. Splint applied by ED staff. NWB LLE.  3. F/U in 1 week.    Signa Kell   10/25/2017 5:28 PM

## 2017-10-25 NOTE — ED Notes (Signed)
Pt states that she slipped in the mud yesterday morning and has bruising to back and is c/o L foot pain.  L foot notably more swollen that the R.  Pt states she has not been able to put any weight on her L foot.  Pt is A&Ox4, in NAD.

## 2017-11-02 ENCOUNTER — Inpatient Hospital Stay: Admission: RE | Admit: 2017-11-02 | Payer: Medicare Other | Source: Ambulatory Visit

## 2017-11-03 ENCOUNTER — Other Ambulatory Visit: Payer: Self-pay

## 2017-11-03 ENCOUNTER — Encounter: Payer: Self-pay | Admitting: *Deleted

## 2017-11-09 ENCOUNTER — Encounter
Admission: RE | Disposition: A | Discharge status: Home / Self Care | Payer: Self-pay | Source: Ambulatory Visit | Attending: Orthopedic Surgery

## 2017-11-09 ENCOUNTER — Ambulatory Visit: Admit: 2017-11-09 | Payer: Medicare Other | Admitting: Orthopedic Surgery

## 2017-11-09 ENCOUNTER — Ambulatory Visit: Payer: Medicare Other | Attending: Orthopedic Surgery

## 2017-11-09 ENCOUNTER — Ambulatory Visit: Payer: Medicare Other | Admitting: Anesthesiology

## 2017-11-09 ENCOUNTER — Ambulatory Visit
Admission: RE | Admit: 2017-11-09 | Discharge: 2017-11-09 | Disposition: A | Discharge status: Home / Self Care | Payer: Medicare Other | Source: Ambulatory Visit | Attending: Orthopedic Surgery | Admitting: Orthopedic Surgery

## 2017-11-09 ENCOUNTER — Ambulatory Visit: Payer: Medicare Other

## 2017-11-09 DIAGNOSIS — F17210 Nicotine dependence, cigarettes, uncomplicated: Secondary | ICD-10-CM | POA: Diagnosis not present

## 2017-11-09 DIAGNOSIS — I10 Essential (primary) hypertension: Secondary | ICD-10-CM | POA: Insufficient documentation

## 2017-11-09 DIAGNOSIS — S82852A Displaced trimalleolar fracture of left lower leg, initial encounter for closed fracture: Secondary | ICD-10-CM | POA: Diagnosis present

## 2017-11-09 DIAGNOSIS — S82842A Displaced bimalleolar fracture of left lower leg, initial encounter for closed fracture: Secondary | ICD-10-CM | POA: Insufficient documentation

## 2017-11-09 DIAGNOSIS — F329 Major depressive disorder, single episode, unspecified: Secondary | ICD-10-CM | POA: Diagnosis not present

## 2017-11-09 DIAGNOSIS — Z79899 Other long term (current) drug therapy: Secondary | ICD-10-CM | POA: Insufficient documentation

## 2017-11-09 DIAGNOSIS — K029 Dental caries, unspecified: Secondary | ICD-10-CM | POA: Diagnosis present

## 2017-11-09 DIAGNOSIS — F419 Anxiety disorder, unspecified: Secondary | ICD-10-CM | POA: Insufficient documentation

## 2017-11-09 DIAGNOSIS — W010XXA Fall on same level from slipping, tripping and stumbling without subsequent striking against object, initial encounter: Secondary | ICD-10-CM | POA: Diagnosis not present

## 2017-11-09 DIAGNOSIS — F17200 Nicotine dependence, unspecified, uncomplicated: Secondary | ICD-10-CM | POA: Insufficient documentation

## 2017-11-09 HISTORY — DX: Unspecified osteoarthritis, unspecified site: M19.90

## 2017-11-09 HISTORY — DX: Gastro-esophageal reflux disease without esophagitis: K21.9

## 2017-11-09 HISTORY — DX: Presence of dental prosthetic device (complete) (partial): Z97.2

## 2017-11-09 HISTORY — PX: ORIF ANKLE FRACTURE: SHX5408

## 2017-11-09 SURGERY — OPEN REDUCTION INTERNAL FIXATION (ORIF) ANKLE FRACTURE
Anesthesia: Regional | Site: Ankle | Laterality: Left | Wound class: Clean

## 2017-11-09 SURGERY — OPEN REDUCTION INTERNAL FIXATION (ORIF) ANKLE FRACTURE
Anesthesia: General | Laterality: Left

## 2017-11-09 MED ORDER — LIDOCAINE HCL (CARDIAC) 20 MG/ML IV SOLN
INTRAVENOUS | Status: DC | PRN
  Administered 2017-11-09: 50 mg via INTRATRACHEAL

## 2017-11-09 MED ORDER — LACTATED RINGERS IV SOLN
INTRAVENOUS | Status: DC
  Administered 2017-11-09: 13:00:00 via INTRAVENOUS

## 2017-11-09 MED ORDER — ASPIRIN EC 325 MG PO TBEC: 325.0000 mg | DELAYED_RELEASE_TABLET | Freq: Every day | ORAL | 0 refills | Status: AC

## 2017-11-09 MED ORDER — OXYCODONE HCL 5 MG/5ML PO SOLN: 5.0000 mg | Freq: Once | ORAL | Status: DC | PRN

## 2017-11-09 MED ORDER — OXYCODONE HCL 5 MG PO TABS: 5.0000 mg | ORAL_TABLET | Freq: Once | ORAL | Status: DC | PRN

## 2017-11-09 MED ORDER — FENTANYL CITRATE (PF) 100 MCG/2ML IJ SOLN
INTRAMUSCULAR | Status: DC | PRN
  Administered 2017-11-09 (×4): 25 ug via INTRAVENOUS

## 2017-11-09 MED ORDER — FENTANYL CITRATE (PF) 100 MCG/2ML IJ SOLN: 25.0000 ug | INTRAMUSCULAR | Status: DC | PRN

## 2017-11-09 MED ORDER — ONDANSETRON 4 MG PO TBDP: 4.0000 mg | ORAL_TABLET | Freq: Three times a day (TID) | ORAL | 0 refills | Status: DC | PRN

## 2017-11-09 MED ORDER — PROMETHAZINE HCL 25 MG/ML IJ SOLN: 6.2500 mg | INTRAMUSCULAR | Status: DC | PRN

## 2017-11-09 MED ORDER — ROPIVACAINE HCL 5 MG/ML IJ SOLN
INTRAMUSCULAR | Status: DC | PRN
  Administered 2017-11-09: 30 mL via PERINEURAL
  Administered 2017-11-09: 10 mL via PERINEURAL

## 2017-11-09 MED ORDER — ACETAMINOPHEN 500 MG PO TABS: 1000.0000 mg | ORAL_TABLET | Freq: Three times a day (TID) | ORAL | 2 refills | Status: AC

## 2017-11-09 MED ORDER — GLYCOPYRROLATE 0.2 MG/ML IJ SOLN
INTRAMUSCULAR | Status: DC | PRN
  Administered 2017-11-09: 0.1 mg via INTRAVENOUS

## 2017-11-09 MED ORDER — FENTANYL CITRATE (PF) 100 MCG/2ML IJ SOLN
50.0000 ug | INTRAMUSCULAR | Status: DC | PRN
  Administered 2017-11-09: 100 ug via INTRAVENOUS

## 2017-11-09 MED ORDER — MIDAZOLAM HCL 2 MG/2ML IJ SOLN
1.0000 mg | INTRAMUSCULAR | Status: DC | PRN
  Administered 2017-11-09: 2 mg via INTRAVENOUS

## 2017-11-09 MED ORDER — LACTATED RINGERS IV SOLN: INTRAVENOUS | Status: DC

## 2017-11-09 MED ORDER — DEXAMETHASONE SODIUM PHOSPHATE 4 MG/ML IJ SOLN
INTRAMUSCULAR | Status: DC | PRN
  Administered 2017-11-09: 4 mg via INTRAVENOUS

## 2017-11-09 MED ORDER — OXYCODONE HCL 5 MG PO TABS: 5.0000 mg | ORAL_TABLET | ORAL | 0 refills | Status: DC | PRN

## 2017-11-09 MED ORDER — ONDANSETRON HCL 4 MG/2ML IJ SOLN
INTRAMUSCULAR | Status: DC | PRN
  Administered 2017-11-09: 4 mg via INTRAVENOUS

## 2017-11-09 MED ORDER — SODIUM CHLORIDE 0.9 % IV SOLN
900.0000 mg | Freq: Once | INTRAVENOUS | Status: AC
  Administered 2017-11-09: 900 mg via INTRAVENOUS

## 2017-11-09 MED ORDER — PROPOFOL 10 MG/ML IV BOLUS
INTRAVENOUS | Status: DC | PRN
  Administered 2017-11-09: 30 mg via INTRAVENOUS
  Administered 2017-11-09: 130 mg via INTRAVENOUS

## 2017-11-09 SURGICAL SUPPLY — 37 items
1.4MM WIRE ×2 IMPLANT
10mm x 3.5mm bone screw ×6 IMPLANT
12mm x 3.5mm locking screw ×6 IMPLANT
14mm x 2.7mm bone screw ×2 IMPLANT
16mm x 2.7mm bone screw ×2 IMPLANT
16mmx 2.7mm bone screw ×2 IMPLANT
2.0MM DRILL BIT ×2 IMPLANT
2.6MM DRILL BIT ×2 IMPLANT
2.7MM DRILL BIT ×2 IMPLANT
5 hole distal plate ×2 IMPLANT
BANDAGE ELASTIC 4 LF NS (GAUZE/BANDAGES/DRESSINGS) ×2 IMPLANT
BNDG COHESIVE 4X5 TAN STRL (GAUZE/BANDAGES/DRESSINGS) ×2 IMPLANT
BNDG ESMARK 4X12 TAN STRL LF (GAUZE/BANDAGES/DRESSINGS) ×2 IMPLANT
CHLORAPREP W/TINT 26ML (MISCELLANEOUS) ×2 IMPLANT
COVER LIGHT HANDLE UNIVERSAL (MISCELLANEOUS) ×4 IMPLANT
CUFF TOURN SGL QUICK 30 (MISCELLANEOUS) ×1
CUFF TRNQT CYL LO 30X4X (MISCELLANEOUS) ×1 IMPLANT
DRAPE C-ARM XRAY 36X54 (DRAPES) ×6 IMPLANT
DRAPE IMP U-DRAPE 54X76 (DRAPES) ×4 IMPLANT
GAUZE SPONGE 4X4 12PLY STRL (GAUZE/BANDAGES/DRESSINGS) ×2 IMPLANT
GLOVE BIO SURGEON STRL SZ7.5 (GLOVE) ×4 IMPLANT
GLOVE INDICATOR 8.0 STRL GRN (GLOVE) ×4 IMPLANT
GOWN STRL REUS W/ TWL LRG LVL3 (GOWN DISPOSABLE) ×1 IMPLANT
GOWN STRL REUS W/ TWL XL LVL3 (GOWN DISPOSABLE) ×1 IMPLANT
GOWN STRL REUS W/TWL LRG LVL3 (GOWN DISPOSABLE) ×1
GOWN STRL REUS W/TWL XL LVL3 (GOWN DISPOSABLE) ×1
KIT TURNOVER KIT A (KITS) ×2 IMPLANT
NS IRRIG 500ML POUR BTL (IV SOLUTION) ×2 IMPLANT
PACK EXTREMITY ARMC (MISCELLANEOUS) ×2 IMPLANT
SPONGE LAP 18X18 5 PK (GAUZE/BANDAGES/DRESSINGS) ×2 IMPLANT
STOCKINETTE IMPERVIOUS LG (DRAPES) ×2 IMPLANT
SUT ETHIBOND GREEN BRAID 0S 4 (SUTURE) IMPLANT
SUT ETHIBOND NAB CT1 #1 30IN (SUTURE) IMPLANT
SUT ORTHOCORD 2X36 W/O NDL (SUTURE) IMPLANT
SUT ORTHOCORD W/MULTIPK NDL (SUTURE) IMPLANT
SUT PROLENE 4 0 PS 2 18 (SUTURE) ×2 IMPLANT
SUT VIC AB 2-0 CT2 27 (SUTURE) IMPLANT

## 2017-11-09 NOTE — Discharge Instructions (Signed)
Ankle Fracture Surgery °  °Post-Op Instructions °  °1. Bracing or crutches: Crutches will be provided at the time of discharge. °  °2. Splint/Cast: You will have a splint (3/4 cast) on your leg after surgery. Ensure that this remains clean and dry until follow up appointment. If this becomes wet, you need to call our offices to get it changed or else you risk skin breakdown.   °  °3. Driving:  Plan on not driving for at least four to six weeks. Please note that you are advised NOT to drive while taking narcotic pain medications as you may be impaired and unsafe to drive. °  °4. Activity: Ankle pumps several times an hour while awake to prevent blood clots. Weight bearing: Non-weight bearing. Use crutches for at least 6 weeks, if not longer based on your surgery. Bending and straightening the knee is unlimited. Elevate knee above heart level as much as possible for one week. Avoid standing more than 5 minutes (consecutively) for the first week.  Avoid long distance travel for 4 weeks. °  °5. Medications:  °- You have been provided a prescription for narcotic pain medicine. After surgery, take 1-2 narcotic tablets every 4 hours if needed for severe pain. Please start this as soon as you begin to start having pain (if you received a nerve block, start taking as soon as this wears off).  °- A prescription for anti-nausea medication will be provided in case the narcotic medicine causes nausea - take 1 tablet every 6 hours only if nauseated.  °- Take enteric coated aspirin 325 mg once daily for 4 weeks to prevent blood clots.  °-Take tylenol 1000 mg every 8 hours for pain.  May stop tylenol 5 days after surgery if you are having minimal pain. °  °If you are taking prescription medication for anxiety, depression, insomnia, muscle spasm, chronic pain, or for attention deficit disorder you are advised that you are at a higher risk of adverse effects with use of narcotics post-op, including narcotic addiction/dependence,  depressed breathing, death. °If you use non-prescribed substances: alcohol, marijuana, cocaine, heroin, methamphetamines, etc., you are at a higher risk of adverse effects with use of narcotics post-op, including narcotic addiction/dependence, depressed breathing, death. °You are advised that taking > 50 morphine milligram equivalents (MME) of narcotic pain medication per day results in twice the risk of overdose or death. For your prescription provided: oxycodone 5 mg - taking more than 6 tablets per day. Be advised that we will prescribe narcotics short-term, for acute post-operative pain only - 1 week for minor operations such as knee arthroscopy for meniscus tear resection, and 3 weeks for major operations such as knee repair/reconstruction surgeries.  °  °6. Physical Therapy: Plan to start after follow up appointment at 2 weeks. 1-2 times per week for ~12-16 weeks. Therapy typically starts on post operative Day 3 or 4. You have been provided an order for physical therapy. The therapist will provide home exercises. Please contact our offices if this appointment has not been scheduled.  °  °7. Work/School: May return to full work when off of crutches. May do light duty/desk job or return to school in approximately 1-2 weeks when off of narcotics, pain is well-controlled, and swelling has decreased. °  °8. Post-Op Appointments: °Your first post-op appointment will be with Dr. Alyscia Carmon in approximately 2 weeks time.  °  °If you find that they have not been scheduled please call the Orthopaedic Appointment front desk at [phone]. ° ° ° ° ° ° ° °General Anesthesia, Adult, Care After °  These instructions provide you with information about caring for yourself after your procedure. Your health care provider may also give you more specific instructions. Your treatment has been planned according to current medical practices, but problems sometimes occur. Call your health care provider if you have any problems or questions  after your procedure. °What can I expect after the procedure? °After the procedure, it is common to have: °· Vomiting. °· A sore throat. °· Mental slowness. ° °It is common to feel: °· Nauseous. °· Cold or shivery. °· Sleepy. °· Tired. °· Sore or achy, even in parts of your body where you did not have surgery. ° °Follow these instructions at home: °For at least 24 hours after the procedure: °· Do not: °? Participate in activities where you could fall or become injured. °? Drive. °? Use heavy machinery. °? Drink alcohol. °? Take sleeping pills or medicines that cause drowsiness. °? Make important decisions or sign legal documents. °? Take care of children on your own. °· Rest. °Eating and drinking °· If you vomit, drink water, juice, or soup when you can drink without vomiting. °· Drink enough fluid to keep your urine clear or pale yellow. °· Make sure you have little or no nausea before eating solid foods. °· Follow the diet recommended by your health care provider. °General instructions °· Have a responsible adult stay with you until you are awake and alert. °· Return to your normal activities as told by your health care provider. Ask your health care provider what activities are safe for you. °· Take over-the-counter and prescription medicines only as told by your health care provider. °· If you smoke, do not smoke without supervision. °· Keep all follow-up visits as told by your health care provider. This is important. °Contact a health care provider if: °· You continue to have nausea or vomiting at home, and medicines are not helpful. °· You cannot drink fluids or start eating again. °· You cannot urinate after 8-12 hours. °· You develop a skin rash. °· You have fever. °· You have increasing redness at the site of your procedure. °Get help right away if: °· You have difficulty breathing. °· You have chest pain. °· You have unexpected bleeding. °· You feel that you are having a life-threatening or urgent  problem. °This information is not intended to replace advice given to you by your health care provider. Make sure you discuss any questions you have with your health care provider. °Document Released: 11/23/2000 Document Revised: 01/20/2016 Document Reviewed: 08/01/2015 °Elsevier Interactive Patient Education © 2018 [employer] ° °

## 2017-11-09 NOTE — Anesthesia Procedure Notes (Signed)
Anesthesia Regional Block: Adductor canal block   Pre-Anesthetic Checklist: ,, timeout performed, Correct Patient, Correct Site, Correct Laterality, Correct Procedure, Correct Position, site marked, Risks and benefits discussed,  Surgical consent,  Pre-op evaluation,  At surgeon's request and post-op pain management   Prep: chloraprep       Needles:  Injection technique: Single-shot  Needle Type: Echogenic Needle     Needle Length: 9cm  Needle Gauge: 21     Additional Needles:   Procedures:,,,, ultrasound used (permanent image in chart),,,,  Narrative:  Start time: 11/09/2017 12:05 PM End time: 11/09/2017 12:15 PM Injection made incrementally with aspirations every 5 mL.  Performed by: Personally  Anesthesiologist: Karren BurlyKovacs, Daniel D, MD  Additional Notes: Functioning IV was confirmed and monitors applied. Ultrasound guidance: relevant anatomy identified, needle position confirmed, local anesthetic spread visualized around nerve(s)., vascular puncture avoided.  Image printed for medical record.  Negative aspiration and no paresthesias; incremental administration of local anesthetic. The patient tolerated the procedure well. Vitals signes recorded in RN notes.

## 2017-11-09 NOTE — Op Note (Signed)
Operative Note   SURGERY DATE: 11/09/2017  PRE-OP DIAGNOSIS:  1. Trimalleolar equivalent fracture of L ankle   POST-OP DIAGNOSIS:  1. Bimalleolar fracture of L ankle  PROCEDURE(S): 1. ORIF bimalleolar ankle fracture  SURGEON: Rosealee AlbeeSunny H. Koleman Marling, MD   ANESTHESIA: Regional  + Gen  ESTIMATED BLOOD LOSS: 10cc  DRAINS:  None  TOTAL IV FLUIDS: see anesthesia record  IMPLANTS: Stryker VariAx distal fibular plate 3 - 8.2NF3.5mm Stryker locking screws distally 3 - 3.295mm Stryker cortical screws proximally 2- 2.617mm Stryker lag screws  INDICATION(S): The patient is a 57 y.o. female who had a fall ~2 weeks ago and noted immediate ankle pain. Radiographs showed a lateral malleolus fracture with positive ER stress test.   OPERATIVE FINDINGS: Distal fibula fracture  OPERATIVE REPORT:   The patient was seen in the Holding Room. The risks, benefits, complications, treatment options, and expected outcomes were discussed with the patient. The risks and potential complications of the problem and purposed treatment include but are not limited to infection, bleeding, pain, stiffness, nerve and vessel injury and complication secondary to the anesthetic. The patient concurred with the proposed plan, giving informed consent.  The site of surgery was properly noted/marked. A peripheral nerve block was administered by the Anesthesia team.  The patient was taken to Operating Room and transferred to the operating room table. A Time Out was held and the patient identity, procedure, and laterality was confirmed. After administration of adequate anesthesia, the entire lower extremity was prescrubbed with Hibiclens and alcohol, prepped with Chloroprep, and draped in sterile fashion. The patient was given pre-operative IV antibiotics within 30 minutes of the skin incision.  The tourniquet was inflated to 250mmHg after exsanguinating the leg with an Esmarch bandage. A standard distal fibular incision was made  with a 15 blade along the lateral aspect of the fibula. The superficial peroneal nerve was easily identified and protected throughout the case.  Dissection was carried down sharply to the fibula. The fracture site was identified and cleared of any tissue with a combination of dental pick, knife, and curette.  A reduction clamp was placed and the fracture was reduced. Anatomic reduction was confirmed visually and fluoroscopically. The fracture line was long enough to accommodate two Synthes 2.527mm lag screws in an A-P fashion. The reduction clamp was removed and the fracture remained anatomically reduced.   A Stryker VariAx Distal Fibula plate was selected after confirming appropriate size and position fluoroscopically. The plate was contoured with plate benders as necessary. Three locking screws were placed in the distal fragment. Then 3 additional cortical screws were placed in the proximal fragment. Fluoroscopy then confirmed appropriate hardware position and reduction. External rotation stress view was negative.    The medial malleolar fragment was an avulsion fragment that did not require fixation given the stability of the ankle mortise after lateral-sided fixation.   The wound was then thoroughly irrigated. The tourniquet was deflated with a time of 67 minutes. Hemostasis was achieved with bovie electrocautery. 0 Vicryl sutures were used to close the deep layer over the plate. 3-0 Vicryl was used to close the subdermal layer. Staples were used to close the skin. The wound was dressed with xeroform, fluffs, and cotton guaze.  The leg was then placed in a short leg splint. The patient was awakened from anesthesia without any further complication and transferred to PACU for further recovery.    POST-OPERATIVE PLAN:  Patient will be discharged to home. The patient will be NWB for 6 weeks. ASA  x 4 weeks for DVT ppx. Follow up in 2 weeks as an outpatient. Transition to walking boot at that time. Start PT  after 2 week appointment.

## 2017-11-09 NOTE — Anesthesia Postprocedure Evaluation (Signed)
Anesthesia Post Note  Patient: Maria Hodge  Procedure(s) Performed: OPEN REDUCTION INTERNAL FIXATION (ORIF) ANKLE FRACTURE WITH POSSIBLE SYNDESMOSIS REPAIR (Left Ankle)  Patient location during evaluation: PACU Anesthesia Type: General and Regional Level of consciousness: awake and alert Pain management: pain level controlled Vital Signs Assessment: post-procedure vital signs reviewed and stable Respiratory status: spontaneous breathing, nonlabored ventilation, respiratory function stable and patient connected to nasal cannula oxygen Cardiovascular status: blood pressure returned to baseline and stable Postop Assessment: no apparent nausea or vomiting Anesthetic complications: no    DANIEL D KOVACS

## 2017-11-09 NOTE — Anesthesia Procedure Notes (Signed)
Anesthesia Regional Block: Popliteal block   Pre-Anesthetic Checklist: ,, timeout performed, Correct Patient, Correct Site, Correct Laterality, Correct Procedure, Correct Position, site marked, Risks and benefits discussed,  Surgical consent,  Pre-op evaluation,  At surgeon's request and post-op pain management   Prep: chloraprep       Needles:  Injection technique: Single-shot  Needle Type: Echogenic Needle     Needle Length: 9cm  Needle Gauge: 21     Additional Needles:   Procedures:,,,, ultrasound used (permanent image in chart),,,,  Narrative:  Start time: 11/09/2017 12:05 PM End time: 11/09/2017 12:15 PM Injection made incrementally with aspirations every 5 mL.  Performed by: Personally  Anesthesiologist: Karren BurlyKovacs, Chrishawna Farina D, MD  Additional Notes: Functioning IV was confirmed and monitors applied. Ultrasound guidance: relevant anatomy identified, needle position confirmed, local anesthetic spread visualized around nerve(s)., vascular puncture avoided.  Image printed for medical record.  Negative aspiration and no paresthesias; incremental administration of local anesthetic. The patient tolerated the procedure well. Vitals signes recorded in RN notes.

## 2017-11-09 NOTE — Transfer of Care (Signed)
Immediate Anesthesia Transfer of Care Note  Patient: Maria GroomsCecilia M Harwick  Procedure(s) Performed: OPEN REDUCTION INTERNAL FIXATION (ORIF) ANKLE FRACTURE WITH POSSIBLE SYNDESMOSIS REPAIR (Left Ankle)  Patient Location: PACU  Anesthesia Type: General, Regional  Level of Consciousness: awake, alert  and patient cooperative  Airway and Oxygen Therapy: Patient Spontanous Breathing and Patient connected to supplemental oxygen  Post-op Assessment: Post-op Vital signs reviewed, Patient's Cardiovascular Status Stable, Respiratory Function Stable, Patent Airway and No signs of Nausea or vomiting  Post-op Vital Signs: Reviewed and stable  Complications: No apparent anesthesia complications

## 2017-11-09 NOTE — Anesthesia Procedure Notes (Signed)
Procedure Name: LMA Insertion Date/Time: 11/09/2017 1:06 PM Performed by: Maree KrabbeWarren, Sigrid Schwebach, CRNA Pre-anesthesia Checklist: Patient identified, Emergency Drugs available, Suction available, Timeout performed and Patient being monitored Patient Re-evaluated:Patient Re-evaluated prior to induction Oxygen Delivery Method: Circle system utilized Preoxygenation: Pre-oxygenation with 100% oxygen Induction Type: IV induction LMA: LMA inserted LMA Size: 3.0 Number of attempts: 1 Placement Confirmation: positive ETCO2 and breath sounds checked- equal and bilateral Tube secured with: Tape Dental Injury: Teeth and Oropharynx as per pre-operative assessment

## 2017-11-09 NOTE — H&P (Signed)
Paper H&P to be scanned into permanent record. H&P reviewed. No significant changes noted.  

## 2017-11-09 NOTE — Anesthesia Preprocedure Evaluation (Addendum)
Anesthesia Evaluation    Airway Mallampati: II  TM Distance: >3 FB Neck ROM: Full    Dental no notable dental hx. (+) Poor Dentition   Pulmonary Current Smoker,    Pulmonary exam normal breath sounds clear to auscultation       Cardiovascular hypertension, Normal cardiovascular exam+ Valvular Problems/Murmurs  Rhythm:Regular Rate:Normal     Neuro/Psych Anxiety    GI/Hepatic   Endo/Other    Renal/GU      Musculoskeletal  (+) Arthritis ,   Abdominal   Peds  Hematology   Anesthesia Other Findings   Reproductive/Obstetrics                             Anesthesia Physical Anesthesia Plan  ASA: III  Anesthesia Plan: General and Regional   Post-op Pain Management: GA combined w/ Regional for post-op pain   Induction: Intravenous  PONV Risk Score and Plan:   Airway Management Planned: LMA  Additional Equipment:   Intra-op Plan:   Post-operative Plan: Extubation in OR  Informed Consent: I have reviewed the patients History and Physical, chart, labs and discussed the procedure including the risks, benefits and alternatives for the proposed anesthesia with the patient or authorized representative who has indicated his/her understanding and acceptance.   Dental advisory given  Plan Discussed with: CRNA  Anesthesia Plan Comments: (We had an extensive conversation (pt and anesthesiologist) regarding the possibility of permanent nerve injury with nerve block.  The patient sustained leg injury which created this fracture.  There is now pre-existing numbness across the dorsal aspect of her foot.  We discussed the "double crush" phenomenon in which local anesthetic's nerve toxicity can exacerbate existing nerve injury or unmask previously subclinical nerve damage.  Additionally, this is a trimalleolar fracture with extensive damage and long expected surgery time.  The tourniquet represents a  potential "third crush" if there is prolonged tourniquet time.  The patient understands this risk after a 20 minute conversation. She verbalized understanding that she may have numbness or weakness in her lower extremity following the nerve block, but wishes to continue. Julieanne Cottonhris Tzipporah Nagorski MD 11/09/2017 at 11:51 am. )       Anesthesia Quick Evaluation

## 2017-11-15 ENCOUNTER — Other Ambulatory Visit: Payer: Self-pay | Admitting: Psychiatry

## 2018-03-01 ENCOUNTER — Encounter: Payer: Self-pay | Admitting: Emergency Medicine

## 2018-03-01 ENCOUNTER — Emergency Department: Payer: Medicare Other

## 2018-03-01 ENCOUNTER — Emergency Department
Admission: EM | Admit: 2018-03-01 | Discharge: 2018-03-01 | Disposition: A | Discharge status: Home / Self Care | Payer: Medicare Other | Attending: Emergency Medicine | Admitting: Emergency Medicine

## 2018-03-01 DIAGNOSIS — I10 Essential (primary) hypertension: Secondary | ICD-10-CM | POA: Insufficient documentation

## 2018-03-01 DIAGNOSIS — S93402A Sprain of unspecified ligament of left ankle, initial encounter: Secondary | ICD-10-CM | POA: Insufficient documentation

## 2018-03-01 DIAGNOSIS — Y929 Unspecified place or not applicable: Secondary | ICD-10-CM | POA: Insufficient documentation

## 2018-03-01 DIAGNOSIS — X500XXA Overexertion from strenuous movement or load, initial encounter: Secondary | ICD-10-CM | POA: Diagnosis not present

## 2018-03-01 DIAGNOSIS — Z79899 Other long term (current) drug therapy: Secondary | ICD-10-CM | POA: Insufficient documentation

## 2018-03-01 DIAGNOSIS — Y939 Activity, unspecified: Secondary | ICD-10-CM | POA: Insufficient documentation

## 2018-03-01 DIAGNOSIS — F17210 Nicotine dependence, cigarettes, uncomplicated: Secondary | ICD-10-CM | POA: Insufficient documentation

## 2018-03-01 DIAGNOSIS — Y999 Unspecified external cause status: Secondary | ICD-10-CM | POA: Diagnosis not present

## 2018-03-01 DIAGNOSIS — S99912A Unspecified injury of left ankle, initial encounter: Secondary | ICD-10-CM | POA: Diagnosis present

## 2018-03-01 MED ORDER — TRAMADOL HCL 50 MG PO TABS: 50.0000 mg | ORAL_TABLET | Freq: Two times a day (BID) | ORAL | 0 refills | Status: DC

## 2018-03-01 MED ORDER — MELOXICAM 15 MG PO TABS: 15.0000 mg | ORAL_TABLET | Freq: Every day | ORAL | 0 refills | Status: AC

## 2018-03-01 MED ORDER — TRAMADOL HCL 50 MG PO TABS
50.0000 mg | ORAL_TABLET | Freq: Once | ORAL | Status: AC
  Administered 2018-03-01: 50 mg via ORAL
  Filled 2018-03-01: qty 1

## 2018-03-01 NOTE — ED Notes (Signed)
See triage note  Presents with left ankle pain this am  States she felt a pop and thought that her ankle bent   No deformity noted  No swelling  Good pulses

## 2018-03-01 NOTE — Discharge Instructions (Signed)
Your exam and x-ray are normal at this time. Take the prescription meds as directed. Follow-up with Podiatry for ongoing symptoms.

## 2018-03-01 NOTE — ED Triage Notes (Signed)
PT arrived with complaints of left ankle pain. Pt states she had repairitive surgery on the ankle after breaking it. PT states last night she felt it pop night and the pain has been persistent since. Pulses present and strong on both feet.

## 2018-03-01 NOTE — ED Provider Notes (Signed)
[facility] Emergency Department Provider Note ____________________________________________  Time seen: 1130  I have reviewed the triage vital signs and the nursing notes.  HISTORY  Chief Complaint  Ankle Pain  HPI Sinclair GroomsCecilia M Manfredi is a 57 y.o. female presents to the ED accompanied by family member, for evaluation of left ankle pain.  Patient is about 4 months status post an ORIF to the left ankle for fibula fracture.  She was concerned because she is rolled her ankle last night.  She reports feeling a pop to the dorsal lateral aspect of the ankle and has had persistent pain since then.  She denies any other injury at this time.  [facility], and was advised to report to the ED for evaluation.  Past Medical History:  Diagnosis Date  . Anxiety   . Arrhythmia   . Arthritis    hands  . Depression   . GERD (gastroesophageal reflux disease)   . Heart murmur   . Hypertension   . Wears dentures    partial upper    There are no active problems to display for this patient.   Past Surgical History:  Procedure Laterality Date  . BACK SURGERY  2010  . ORIF ANKLE FRACTURE Left 11/09/2017   Procedure: OPEN REDUCTION INTERNAL FIXATION (ORIF) ANKLE FRACTURE WITH POSSIBLE SYNDESMOSIS REPAIR;  Surgeon: Signa KellPatel, Sunny, MD;  Location: [facility];  Service: Orthopedics;  Laterality: Left;  GENERAL WITH NERVE BLOCK ALEX SYNDEAMOSIS TIGHTROPE MARK ANKLE FRACTURE PLATES  C ARM  PLASTER SPLINT  . 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 Authorizing Provider  acetaminophen (TYLENOL) 500 MG tablet  Take 2 tablets (1,000 mg total) by mouth every 8 (eight) hours. 11/09/17 11/09/18  Signa KellPatel, Sunny, MD  amitriptyline (ELAVIL) 150 MG tablet 150mg  qhs 10/14/17   Clapacs, Jackquline DenmarkJohn T, MD  buPROPion (WELLBUTRIN XL) 150 MG 24 hr tablet Take 1 tablet (150 mg total) by mouth daily. 10/14/17   Clapacs, Jackquline DenmarkJohn T, MD  esomeprazole (NEXIUM) 20 MG capsule Take 20 mg by mouth daily.    [provider]  meloxicam (MOBIC) 15 MG tablet Take 1 tablet (15 mg total) by mouth daily. 03/01/18 03/31/18  Xinyi Batton, Charlesetta IvoryJenise V Bacon, PA-C  QUEtiapine (SEROQUEL) 400 MG tablet Take 1 tablet (400 mg total) by mouth 2 (two) times daily. 10/14/17   Clapacs, Jackquline DenmarkJohn T, MD  QUEtiapine (SEROQUEL) 400 MG tablet TAKE ONE TABLET BY MOUTH TWICE DAILY 11/15/17   Clapacs, Jackquline DenmarkJohn T, MD  traMADol (ULTRAM) 50 MG tablet Take 1 tablet (50 mg total) by mouth 2 (two) times daily. 03/01/18   Darcy Barbara, Charlesetta IvoryJenise V Bacon, PA-C    Allergies Penicillins and Aspirin  Family History  Problem Relation Age of Onset  . Heart failure Mother   . Hypertension Mother   . Depression Mother   . Anxiety disorder Mother   . Cancer Father   . Rheum arthritis Father   . Heart failure Sister   . Rheum arthritis Sister   . Heart failure Sister   . Cancer Sister     Social History Social History   Tobacco Use  . Smoking status: Current Every Day Smoker  Packs/day: 0.50    Types: Cigarettes    Start date: 06/05/1992  . Smokeless tobacco: Never Used  . Tobacco comment: down to 1/3 PPD  Substance Use Topics  . Alcohol use: No    Alcohol/week: 0.0 oz  . Drug use: No    Review of Systems  Constitutional: Negative for fever. Cardiovascular: Negative for chest pain. Respiratory: Negative for shortness of breath. Musculoskeletal: Negative for back pain.  Left ankle pain as above. Skin: Negative for rash. Neurological: Negative for headaches, focal weakness or numbness. ____________________________________________  PHYSICAL EXAM:  VITAL SIGNS: ED Triage  Vitals  Enc Vitals Group     BP 03/01/18 1041 (!) 124/97     Pulse Rate 03/01/18 1041 (!) 122     Resp 03/01/18 1041 20     Temp 03/01/18 1041 98.1 F (36.7 C)     Temp Source 03/01/18 1041 Oral     SpO2 03/01/18 1041 95 %     Weight 03/01/18 1046 145 lb (65.8 kg)     Height 03/01/18 1046 5\' 5"  (1.651 m)     Head Circumference --      Peak Flow --      Pain Score 03/01/18 1046 8     Pain Loc --      Pain Edu? --      Excl. in GC? --     Constitutional: Alert and oriented. Well appearing and in no distress. Head: Normocephalic and atraumatic. Cardiovascular: Normal rate, regular rhythm. Normal distal pulses. Respiratory: Normal respiratory effort.  Musculoskeletal: Nontender with normal range of motion in all extremities.  Neurologic:  Normal gait without ataxia. Normal speech and language. No gross focal neurologic deficits are appreciated. Skin:  Skin is warm, dry and intact. No rash noted. ____________________________________________   RADIOLOGY  Left Ankle IMPRESSION: ORIF of left fibular fracture without complicating feature. No acute fracture. ____________________________________________  PROCEDURES  Ultram 50 mg PO ____________________________________________  INITIAL IMPRESSION / ASSESSMENT AND PLAN / ED COURSE  With ED evaluation of left ankle pain after mechanical fall.  Patient's history is concerning because she is 4 months status post ORIF to the left ankle.  X-rays negative for any acute fracture dislocation.  Also no indication of any heart retraction is appreciated.  Patient's exam is also benign.  She will be discharged with rice instructions, and a prescription for meloxicam as well as #10 Ultram was provided for her benefit.  He is to follow-up with podiatry for ongoing ankle pain.  She is also advised that intermittent swelling to the ankle joint is to be expected in the postop.  Even up to 8-12 months status post surgery.  I reviewed the patient's  prescription history over the last 12 months in the multi-state controlled substances database(s) that includes [place], [place], [place], [place], [place], [place], [place], [place], [place], [place], [place], [place], [place], and [place].  Results were notable for no recent prescriptions.  ____________________________________________  FINAL CLINICAL IMPRESSION(S) / ED DIAGNOSES  Final diagnoses:  Sprain of left ankle, unspecified ligament, initial encounter     Lissa Hoard, PA-C 03/01/18 1159  Jene Every, MD 03/01/18 1202

## 2018-04-12 ENCOUNTER — Ambulatory Visit: Payer: Medicare Other | Admitting: Psychiatry

## 2018-04-26 ENCOUNTER — Other Ambulatory Visit: Payer: Self-pay | Admitting: Psychiatry

## 2018-06-07 ENCOUNTER — Encounter: Payer: Self-pay | Admitting: Emergency Medicine

## 2018-06-07 ENCOUNTER — Emergency Department: Payer: Medicare Other

## 2018-06-07 ENCOUNTER — Emergency Department
Admission: EM | Admit: 2018-06-07 | Discharge: 2018-06-07 | Disposition: A | Discharge status: Home / Self Care | Payer: Medicare Other | Attending: Emergency Medicine | Admitting: Emergency Medicine

## 2018-06-07 ENCOUNTER — Ambulatory Visit: Payer: Medicare Other | Admitting: Psychiatry

## 2018-06-07 ENCOUNTER — Other Ambulatory Visit: Payer: Self-pay

## 2018-06-07 DIAGNOSIS — Z79899 Other long term (current) drug therapy: Secondary | ICD-10-CM | POA: Diagnosis not present

## 2018-06-07 DIAGNOSIS — I10 Essential (primary) hypertension: Secondary | ICD-10-CM | POA: Diagnosis not present

## 2018-06-07 DIAGNOSIS — F17210 Nicotine dependence, cigarettes, uncomplicated: Secondary | ICD-10-CM | POA: Diagnosis not present

## 2018-06-07 DIAGNOSIS — R109 Unspecified abdominal pain: Secondary | ICD-10-CM | POA: Diagnosis present

## 2018-06-07 DIAGNOSIS — K5900 Constipation, unspecified: Secondary | ICD-10-CM | POA: Diagnosis not present

## 2018-06-07 LAB — URINALYSIS, COMPLETE (UACMP) WITH MICROSCOPIC
Bacteria, UA: NONE SEEN
Bilirubin Urine: NEGATIVE
Glucose, UA: NEGATIVE mg/dL
HGB URINE DIPSTICK: NEGATIVE
Ketones, ur: NEGATIVE mg/dL
LEUKOCYTES UA: NEGATIVE
Nitrite: NEGATIVE
PROTEIN: NEGATIVE mg/dL
Specific Gravity, Urine: 1.008 (ref 1.005–1.030)
pH: 7 (ref 5.0–8.0)

## 2018-06-07 LAB — CBC
HCT: 50.1 % — ABNORMAL HIGH (ref 36.0–46.0)
HEMOGLOBIN: 16.4 g/dL — AB (ref 12.0–15.0)
MCH: 29.1 pg (ref 26.0–34.0)
MCHC: 32.7 g/dL (ref 30.0–36.0)
MCV: 88.8 fL (ref 80.0–100.0)
NRBC: 0 % (ref 0.0–0.2)
Platelets: 192 10*3/uL (ref 150–400)
RBC: 5.64 MIL/uL — AB (ref 3.87–5.11)
RDW: 14.9 % (ref 11.5–15.5)
WBC: 8.8 10*3/uL (ref 4.0–10.5)

## 2018-06-07 LAB — COMPREHENSIVE METABOLIC PANEL
ALBUMIN: 4.4 g/dL (ref 3.5–5.0)
ALK PHOS: 78 U/L (ref 38–126)
ALT: 9 U/L (ref 0–44)
ANION GAP: 8 (ref 5–15)
AST: 14 U/L — ABNORMAL LOW (ref 15–41)
BUN: 11 mg/dL (ref 6–20)
CHLORIDE: 105 mmol/L (ref 98–111)
CO2: 25 mmol/L (ref 22–32)
Calcium: 9.8 mg/dL (ref 8.9–10.3)
Creatinine, Ser: 0.99 mg/dL (ref 0.44–1.00)
GFR calc non Af Amer: >60 mL/min (ref >60)
GLUCOSE: 120 mg/dL — AB (ref 70–99)
Potassium: 4 mmol/L (ref 3.5–5.1)
Sodium: 138 mmol/L (ref 135–145)
Total Bilirubin: 0.7 mg/dL (ref 0.3–1.2)
Total Protein: 7.8 g/dL (ref 6.5–8.1)

## 2018-06-07 LAB — LIPASE, BLOOD: Lipase: 29 U/L (ref 11–51)

## 2018-06-07 MED ORDER — POLYETHYLENE GLYCOL 3350 17 G PO PACK: 17.0000 g | PACK | Freq: Every day | ORAL | 0 refills | Status: DC

## 2018-06-07 NOTE — ED Notes (Signed)
EDP at the bedside, pt is refusing a rectal exam or CT scan to verify cause of sx. Pt educated on the possibilities of cancer by the EDP and the need to follow up with GI for a coloscopy.

## 2018-06-07 NOTE — ED Triage Notes (Signed)
Pt reports that she is having lower abd pain, She states that she hasnt had a BM for about a week and she is feeling bloated.

## 2018-06-07 NOTE — ED Provider Notes (Addendum)
[facility] Emergency Department Provider Note  ____________________________________________   I have reviewed the triage vital signs and the nursing notes. Where available I have reviewed prior notes and, if possible and indicated, outside hospital notes.    HISTORY  Chief Complaint Abdominal Pain and Constipation    HPI LUREE PALLA is a 57 y.o. female with a history of anxiety, depression, constipation reflux disease hypertension, tobacco abuse, family history of colon cancer in her sister, states that she has been having constipation for several years but is been worse over the last couple of days.  She states her last week or so she is been having small and satisfying hard bowel movements.  No melena no bright red blood per rectum.  She states when she has to go she has cramping, which is relieved by her bowel movement small as they are.  She tried Ex-Lax yesterday and today and Colace yesterday and today but she still feels constipated.  She is adamant that she does not want to have an enema or rectal exam here.  She has never had a colonoscopy.  Nothing makes it better nothing makes it worse.  She is in no pain unless she feels the need to have a bowel movement.  No fever no vomiting.  She does have the discomfort as a crampy lower abdominal diffuse pain that comes and goes, "like you have to poop"   Past Medical History:  Diagnosis Date  . Anxiety   . Arrhythmia   . Arthritis    hands  . Depression   . GERD (gastroesophageal reflux disease)   . Heart murmur   . Hypertension   . Wears dentures    partial upper    There are no active problems to display for this patient.   Past Surgical History:  Procedure Laterality Date  . BACK SURGERY  2010  . ORIF ANKLE FRACTURE Left 11/09/2017   Procedure: OPEN REDUCTION INTERNAL FIXATION (ORIF) ANKLE FRACTURE WITH POSSIBLE SYNDESMOSIS REPAIR;  Surgeon: Signa Kell, MD;  Location: [facility];   Service: Orthopedics;  Laterality: Left;  GENERAL WITH NERVE BLOCK ALEX SYNDEAMOSIS TIGHTROPE MARK ANKLE FRACTURE PLATES  C ARM  PLASTER SPLINT  . 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 Authorizing Provider  acetaminophen (TYLENOL) 500 MG tablet Take 2 tablets (1,000 mg total) by mouth every 8 (eight) hours. 11/09/17 11/09/18  Signa Kell, MD  amitriptyline (ELAVIL) 150 MG tablet TAKE 1 TABLET BY MOUTH EVERYDAY AT BEDTIME 04/28/18   Clapacs, Jackquline Denmark, MD  buPROPion (WELLBUTRIN XL) 150 MG 24 hr tablet Take 1 tablet (150 mg total) by mouth daily. 10/14/17   Clapacs, Jackquline Denmark, MD  esomeprazole (NEXIUM) 20 MG capsule Take 20 mg by mouth daily.    [provider]  QUEtiapine (SEROQUEL) 400 MG tablet Take 1 tablet (400 mg total) by mouth 2 (two) times daily. 10/14/17   Clapacs, Jackquline Denmark, MD  QUEtiapine (SEROQUEL) 400 MG tablet TAKE ONE TABLET BY MOUTH TWICE DAILY 11/15/17   Clapacs, Jackquline Denmark, MD  traMADol (ULTRAM) 50 MG tablet Take 1 tablet (50 mg total) by mouth 2 (two) times daily. 03/01/18  Menshew, Charlesetta Ivory, PA-C    Allergies Penicillins and Aspirin  Family History  Problem Relation Age of Onset  . Heart failure Mother   . Hypertension Mother   . Depression Mother   . Anxiety disorder Mother   . Cancer Father   . Rheum arthritis Father   . Heart failure Sister   . Rheum arthritis Sister   . Heart failure Sister   . Cancer Sister     Social History Social History   Tobacco Use  . Smoking status: Current Every Day Smoker    Packs/day: 0.50    Types: Cigarettes    Start date: 06/05/1992  . Smokeless tobacco: Never  Used  . Tobacco comment: down to 1/3 PPD  Substance Use Topics  . Alcohol use: No    Alcohol/week: 0.0 standard drinks  . Drug use: No    Review of Systems Constitutional: No fever/chills Eyes: No visual changes. ENT: No sore throat. No stiff neck no neck pain Cardiovascular: Denies chest pain. Respiratory: Denies shortness of breath. Gastrointestinal:   no vomiting.  No diarrhea.  + constipation. Genitourinary: Negative for dysuria. Musculoskeletal: Negative lower extremity swelling Skin: Negative for rash. Neurological: Negative for severe headaches, focal weakness or numbness.   ____________________________________________   PHYSICAL EXAM:  VITAL SIGNS: ED Triage Vitals  Enc Vitals Group     BP 06/07/18 1311 (!) 141/68     Pulse Rate 06/07/18 1311 (!) 112     Resp 06/07/18 1311 18     Temp 06/07/18 1311 97.7 F (36.5 C)     Temp Source 06/07/18 1311 Oral     SpO2 06/07/18 1311 100 %     Weight 06/07/18 1313 140 lb (63.5 kg)     Height 06/07/18 1313 5\' 5"  (1.651 m)     Head Circumference --      Peak Flow --      Pain Score 06/07/18 1312 8     Pain Loc --      Pain Edu? --      Excl. in GC? --     Constitutional: Alert and oriented. Well appearing and in no acute distress. Eyes: Conjunctivae are normal Head: Atraumatic HEENT: No congestion/rhinnorhea. Mucous membranes are moist.  Oropharynx non-erythematous Neck:   Nontender with no meningismus, no masses, no stridor Cardiovascular: Normal rate, regular rhythm. Grossly normal heart sounds.  Good peripheral circulation. Respiratory: Normal respiratory effort.  No retractions. Lungs CTAB. Abdominal: Soft and nontender. No distention. No guarding no rebound Rectal exam: Patient refused Back:  There is no focal tenderness or step off.  there is no midline tenderness there are no lesions noted. there is no CVA tenderness Musculoskeletal: No lower extremity tenderness, no upper extremity tenderness. No joint  effusions, no DVT signs strong distal pulses no edema Neurologic:  Normal speech and language. No gross focal neurologic deficits are appreciated.  Skin:  Skin is warm, dry and intact. No rash noted. Psychiatric: Mood and affect are normal. Speech and behavior are normal.  ____________________________________________   LABS (all labs ordered are listed, but only abnormal results are displayed)  Labs Reviewed  COMPREHENSIVE METABOLIC PANEL - Abnormal; Notable for the following components:      Result Value   Glucose, Bld 120 (*)    AST 14 (*)    All other components within normal limits  CBC - Abnormal; Notable for the following components:   RBC 5.64 (*)    Hemoglobin 16.4 (*)    HCT  50.1 (*)    All other components within normal limits  URINALYSIS, COMPLETE (UACMP) WITH MICROSCOPIC - Abnormal; Notable for the following components:   Color, Urine YELLOW (*)    APPearance CLEAR (*)    All other components within normal limits  LIPASE, BLOOD    Pertinent labs  results that were available during my care of the patient were reviewed by me and considered in my medical decision making (see chart for details). ____________________________________________  EKG  I personally interpreted any EKGs ordered by me or triage  ____________________________________________  RADIOLOGY  Pertinent labs & imaging results that were available during my care of the patient were reviewed by me and considered in my medical decision making (see chart for details). If possible, patient and/or family made aware of any abnormal findings.  Dg Abdomen Acute W/chest  Result Date: 06/07/2018 CLINICAL DATA:  Abdominal pain for several days, no recent bowel movements EXAM: DG ABDOMEN ACUTE W/ 1V CHEST COMPARISON:  06/18/2017 chest x-ray FINDINGS: Elevation of the right hemidiaphragm is noted stable from the previous exam. No focal infiltrate or sizable effusion is noted. Cardiac shadow is within normal limits.  Scattered large and small bowel gas is noted. No obstructive changes are seen. Postsurgical changes in the lower lumbar spine are noted. Mild scoliosis concave to the left is seen. No acute bony abnormality is noted. No free air is seen. IMPRESSION: No acute abnormality noted to correspond with the patient's given clinical history. Electronically Signed   By: Alcide Clever M.D.   On: 06/07/2018 15:15   ____________________________________________    PROCEDURES  Procedure(s) performed: None  Procedures  Critical Care performed: None  ____________________________________________   INITIAL IMPRESSION / ASSESSMENT AND PLAN / ED COURSE  Pertinent labs & imaging results that were available during my care of the patient were reviewed by me and considered in my medical decision making (see chart for details).  Patient here with constipation.  Abdomen is nonsurgical blood work is reassuring, heart rate was somewhat up the patient states that she was anxious upon arrival and we will recheck that.  When I am in the room is in the mid 90s.  No evidence of sepsis or significant abdominal pathology such as obstruction.  Abdominal x-ray is reassuring, I have offered the patient a CT scan, and she refuses I have also offered the patient a rectal exam and she refuses to have offered an enema and she refuses, she feels that this is a private situation she would prefer to take care of it at home.  I have told her that without CT scan I cannot rule out cancer and she states she understands this, patient is a smoker, I did counsel her on smoking.  She does have extensive risk factors therefore for colon cancer but she declines further work-up at this time.  I stressed the need to follow closely with GI therefore and she states she will do so.  I will give her medications to help her move her bowels however, she understands that prescribing medications does not mean that I am completely knowledgeable about what is  causing her symptoms.  Her husband and our nurse were both present for this conversation.  Given that I can do a CT scan, rectal exam, or enema on this patient I am somewhat limited to my ability to determine whether there is more significant or more sinister pathology present, and she is aware.  She does have medical decision-making capacity my opinion.  Husband  is at bedside.  We will send her home with lactulose which I think will help her bowel movements and I will recommend home enemas as needed but again she does have Brien Few know that she has to follow-up with GI and return for new or worrisome symptoms. Did send message to dr. Tawni Carnes about pt to facilitate outpt w/u and at discharge her abdomen is benign.    ____________________________________________   FINAL CLINICAL IMPRESSION(S) / ED DIAGNOSES  Final diagnoses:  None      This chart was dictated using voice recognition software.  Despite best efforts to proofread,  errors can occur which can change meaning.      Jeanmarie Plant, MD 06/07/18 1558    Jeanmarie Plant, MD 06/07/18 [phone]

## 2018-06-07 NOTE — ED Notes (Signed)
Pt c/o having constipation, states she is unsure when her last BM was states it has been over a week and is c/o lower abd pain/cramping for the past 2-3 days, denies N/V. States she has not eaten much for the past couple of days due to being scared to put anything else on her stomach,. States she took Doculax and exlax yesterday and last night with no relief.Marland Kitchen

## 2018-06-07 NOTE — Discharge Instructions (Addendum)
Are constipated today, at this time, we do not see anything else acutely wrong however that can change.  You would prefer not to have a CT scan or a rectal exam or an enema here.  All this limits our ability to further evaluate you here.  This is certainly your choice of course but it does limit our work-up as we discussed.  We will send you home with medication to see if we can get your bowels moving, and I recommend strongly that you follow-up with GI medicine as listed above for colonoscopy to make sure that you do not have cancer.  If you have increased pain, fever, vomiting, worsening constipation or you change your mind about further emergency department work-up, please return to the emergency department.

## 2018-06-08 ENCOUNTER — Telehealth: Payer: Self-pay | Admitting: Gastroenterology

## 2018-06-08 NOTE — Telephone Encounter (Signed)
Left vm for pt to call office and schedule referred by Dr. Alphonzo Lemmings from the ED

## 2018-06-08 NOTE — Telephone Encounter (Signed)
-----   Message from Pasty Spillers, MD sent at 06/08/2018 11:58 AM EDT -----  Please set up clinic appointment with anyone next available. She was referred by Dr. Alphonzo Lemmings from the ED

## 2018-06-09 ENCOUNTER — Telehealth: Payer: Self-pay | Admitting: Gastroenterology

## 2018-06-09 ENCOUNTER — Telehealth: Payer: Self-pay

## 2018-06-09 ENCOUNTER — Encounter: Payer: Self-pay | Admitting: Gastroenterology

## 2018-06-09 NOTE — Telephone Encounter (Signed)
Left vm for pt to call office and schedule ED Fu per Note from Dr. Karie Schwalbe. Letter sent

## 2018-06-09 NOTE — Telephone Encounter (Signed)
-----   Message from Varnita B Tahiliani, MD sent at 06/08/2018 11:58 AM EDT ----- ° Please set up clinic appointment with anyone next available. She was referred by Dr. Mcshane from the ED ° °

## 2018-06-09 NOTE — Telephone Encounter (Signed)
pt called states she needs refill on her medication pt is a dr. Toni Amend pt but he is not in the office today or tomorrow can you give pt enough medication to do until appt on monday

## 2018-06-10 ENCOUNTER — Telehealth: Payer: Self-pay | Admitting: Psychiatry

## 2018-06-10 MED ORDER — BUPROPION HCL ER (XL) 150 MG PO TB24: 150.0000 mg | ORAL_TABLET | Freq: Every day | ORAL | 0 refills | Status: DC

## 2018-06-10 MED ORDER — QUETIAPINE FUMARATE 400 MG PO TABS: 400.0000 mg | ORAL_TABLET | Freq: Two times a day (BID) | ORAL | 0 refills | Status: DC

## 2018-06-10 NOTE — Telephone Encounter (Signed)
I have sent wellbutrin and seroquel to pharmacy. Pls let me know if she needs any other meds refilled .

## 2018-06-10 NOTE — Telephone Encounter (Signed)
Attempted to call patient to clarify what medications she need refilled - unavailable. Per review of EHR - pt had Elavil refilled in august. Wellbutrin and seroquel last refilled February/march with 5 refills sent over by Dr.Clapac. Will sent wellbutrin and seroquel for 30 days to pharmacy .

## 2018-06-13 ENCOUNTER — Ambulatory Visit: Payer: Medicare Other | Admitting: Psychiatry

## 2018-06-13 ENCOUNTER — Encounter: Payer: Self-pay | Admitting: Psychiatry

## 2018-06-13 ENCOUNTER — Other Ambulatory Visit: Payer: Self-pay

## 2018-06-13 VITALS — BP 125/87 | HR 118 | Temp 98.9°F | Wt 148.6 lb

## 2018-06-13 DIAGNOSIS — F411 Generalized anxiety disorder: Secondary | ICD-10-CM | POA: Diagnosis not present

## 2018-06-13 DIAGNOSIS — F3162 Bipolar disorder, current episode mixed, moderate: Secondary | ICD-10-CM

## 2018-06-13 MED ORDER — AMITRIPTYLINE HCL 150 MG PO TABS: ORAL_TABLET | ORAL | 5 refills | Status: DC

## 2018-06-13 MED ORDER — BUPROPION HCL ER (XL) 150 MG PO TB24: 150.0000 mg | ORAL_TABLET | Freq: Every day | ORAL | 5 refills | Status: DC

## 2018-06-13 MED ORDER — QUETIAPINE FUMARATE 400 MG PO TABS: 400.0000 mg | ORAL_TABLET | Freq: Two times a day (BID) | ORAL | 5 refills | Status: DC

## 2018-06-13 NOTE — Progress Notes (Signed)
Follow-up for this 57 year old woman with chronic anxiety and depression.  No new complaints.  Mood has been stable last 6 months.  No return of major depression.  No suicidal or homicidal thoughts.  Patient's thoughts are lucid behavior is calm.  Denies suicidal ideation.  Neatly dressed and groomed.  Good eye contact.  Normal psychomotor activity.  No sign of delusions psychosis or dangerousness.  Supportive counseling review of medication plan follow-up in 6 months.

## 2018-12-13 ENCOUNTER — Ambulatory Visit: Payer: Medicare Other | Admitting: Psychiatry

## 2018-12-13 ENCOUNTER — Other Ambulatory Visit: Payer: Self-pay | Admitting: Psychiatry

## 2018-12-13 ENCOUNTER — Other Ambulatory Visit: Payer: Self-pay

## 2018-12-13 MED ORDER — QUETIAPINE FUMARATE 400 MG PO TABS: 400.0000 mg | ORAL_TABLET | Freq: Two times a day (BID) | ORAL | 0 refills | Status: DC

## 2018-12-13 MED ORDER — BUPROPION HCL ER (XL) 150 MG PO TB24: 150.0000 mg | ORAL_TABLET | Freq: Every day | ORAL | 5 refills | Status: DC

## 2018-12-13 MED ORDER — AMITRIPTYLINE HCL 150 MG PO TABS: ORAL_TABLET | ORAL | 5 refills | Status: DC

## 2018-12-13 NOTE — Progress Notes (Signed)
Patient was scheduled to have a telephone appointment with me today.  The current telephone number we have in the chart appears to be not working.  Calling that number several times it only led to a message saying the number was not in service.  Patient is likely to decompensate without medication so I will give the benefit of the doubt and have refilled all of her medicines for now.  Look forward to hopefully hopefully hearing from her soon or at least when the whole virus quarantined situation is done.

## 2019-02-11 ENCOUNTER — Other Ambulatory Visit: Payer: Self-pay | Admitting: Psychiatry

## 2019-03-08 ENCOUNTER — Other Ambulatory Visit: Payer: Self-pay | Admitting: Psychiatry

## 2019-03-10 ENCOUNTER — Other Ambulatory Visit: Payer: Self-pay | Admitting: Psychiatry

## 2019-03-10 MED ORDER — AMITRIPTYLINE HCL 150 MG PO TABS: ORAL_TABLET | ORAL | 5 refills | Status: DC

## 2019-03-10 MED ORDER — BUPROPION HCL ER (XL) 150 MG PO TB24: 150.0000 mg | ORAL_TABLET | Freq: Every day | ORAL | 5 refills | Status: DC

## 2019-03-10 MED ORDER — QUETIAPINE FUMARATE 400 MG PO TABS: 400.0000 mg | ORAL_TABLET | Freq: Two times a day (BID) | ORAL | 5 refills | Status: DC

## 2019-03-14 ENCOUNTER — Ambulatory Visit: Payer: Medicare Other | Admitting: Psychiatry

## 2019-03-14 ENCOUNTER — Other Ambulatory Visit: Payer: Self-pay

## 2019-05-16 ENCOUNTER — Other Ambulatory Visit: Payer: Self-pay

## 2019-05-16 ENCOUNTER — Emergency Department
Admission: EM | Admit: 2019-05-16 | Discharge: 2019-05-16 | Disposition: A | Discharge status: Home / Self Care | Payer: Medicare Other | Attending: Emergency Medicine | Admitting: Emergency Medicine

## 2019-05-16 ENCOUNTER — Emergency Department: Payer: Medicare Other

## 2019-05-16 DIAGNOSIS — Z79899 Other long term (current) drug therapy: Secondary | ICD-10-CM | POA: Insufficient documentation

## 2019-05-16 DIAGNOSIS — I10 Essential (primary) hypertension: Secondary | ICD-10-CM | POA: Insufficient documentation

## 2019-05-16 DIAGNOSIS — R109 Unspecified abdominal pain: Secondary | ICD-10-CM | POA: Diagnosis present

## 2019-05-16 DIAGNOSIS — F17210 Nicotine dependence, cigarettes, uncomplicated: Secondary | ICD-10-CM | POA: Insufficient documentation

## 2019-05-16 LAB — COMPREHENSIVE METABOLIC PANEL
ALT: 10 U/L (ref 0–44)
AST: 16 U/L (ref 15–41)
Albumin: 4.2 g/dL (ref 3.5–5.0)
Alkaline Phosphatase: 83 U/L (ref 38–126)
Anion gap: 11 (ref 5–15)
BUN: 11 mg/dL (ref 6–20)
CO2: 26 mmol/L (ref 22–32)
Calcium: 9.3 mg/dL (ref 8.9–10.3)
Chloride: 99 mmol/L (ref 98–111)
Creatinine, Ser: 0.81 mg/dL (ref 0.44–1.00)
GFR calc Af Amer: >60 mL/min (ref >60)
GFR calc non Af Amer: >60 mL/min (ref >60)
Glucose, Bld: 95 mg/dL (ref 70–99)
Potassium: 3.9 mmol/L (ref 3.5–5.1)
Sodium: 136 mmol/L (ref 135–145)
Total Bilirubin: 0.5 mg/dL (ref 0.3–1.2)
Total Protein: 8.4 g/dL — ABNORMAL HIGH (ref 6.5–8.1)

## 2019-05-16 LAB — URINALYSIS, COMPLETE (UACMP) WITH MICROSCOPIC
Bacteria, UA: NONE SEEN
Bilirubin Urine: NEGATIVE
Glucose, UA: NEGATIVE mg/dL
Ketones, ur: NEGATIVE mg/dL
Leukocytes,Ua: NEGATIVE
Nitrite: NEGATIVE
Protein, ur: NEGATIVE mg/dL
Specific Gravity, Urine: 1.015 (ref 1.005–1.030)
pH: 6 (ref 5.0–8.0)

## 2019-05-16 LAB — CBC
HCT: 47.5 % — ABNORMAL HIGH (ref 36.0–46.0)
Hemoglobin: 15.5 g/dL — ABNORMAL HIGH (ref 12.0–15.0)
MCH: 29.5 pg (ref 26.0–34.0)
MCHC: 32.6 g/dL (ref 30.0–36.0)
MCV: 90.5 fL (ref 80.0–100.0)
Platelets: 220 10*3/uL (ref 150–400)
RBC: 5.25 MIL/uL — ABNORMAL HIGH (ref 3.87–5.11)
RDW: 14.8 % (ref 11.5–15.5)
WBC: 9.3 10*3/uL (ref 4.0–10.5)
nRBC: 0 % (ref 0.0–0.2)

## 2019-05-16 LAB — LIPASE, BLOOD: Lipase: 23 U/L (ref 11–51)

## 2019-05-16 MED ORDER — ONDANSETRON HCL 4 MG/2ML IJ SOLN
4.0000 mg | Freq: Once | INTRAMUSCULAR | Status: AC
  Administered 2019-05-16: 4 mg via INTRAVENOUS
  Filled 2019-05-16: qty 2

## 2019-05-16 MED ORDER — MORPHINE SULFATE (PF) 4 MG/ML IV SOLN
4.0000 mg | Freq: Once | INTRAVENOUS | Status: AC
  Administered 2019-05-16: 4 mg via INTRAVENOUS
  Filled 2019-05-16: qty 1

## 2019-05-16 MED ORDER — SODIUM CHLORIDE 0.9% FLUSH: 3.0000 mL | Freq: Once | INTRAVENOUS | Status: DC

## 2019-05-16 MED ORDER — POLYETHYLENE GLYCOL 3350 17 GM/SCOOP PO POWD: 1.0000 | Freq: Once | ORAL | 0 refills | Status: AC

## 2019-05-16 MED ORDER — IOHEXOL 300 MG/ML  SOLN
100.0000 mL | Freq: Once | INTRAMUSCULAR | Status: AC | PRN
  Administered 2019-05-16: 100 mL via INTRAVENOUS

## 2019-05-16 MED ORDER — IOHEXOL 9 MG/ML PO SOLN
500.0000 mL | ORAL | Status: AC
  Administered 2019-05-16: 500 mL via ORAL

## 2019-05-16 MED ORDER — OXYCODONE-ACETAMINOPHEN 5-325 MG PO TABS: 1.0000 | ORAL_TABLET | ORAL | 0 refills | Status: AC | PRN

## 2019-05-16 NOTE — ED Notes (Signed)
Pt drinking po contrast

## 2019-05-16 NOTE — ED Notes (Signed)
Sent another green and purple top tube to lab.

## 2019-05-16 NOTE — ED Notes (Signed)
Pt has right side abd pain for several days.  Pt sent from [facility] for eval.  No n/v/d.  Non radiating pain.  Denies urinary sx.  Pt alert  Speech clear.  Family with pt.

## 2019-05-16 NOTE — ED Notes (Signed)
Patient transported to CT 

## 2019-05-16 NOTE — ED Triage Notes (Signed)
Pt sent from [facility] with c/o RUQ pain for the past 3-4 days, states she thought it was just gas, denies N/V/D/fever/ or constipation.

## 2019-05-16 NOTE — ED Provider Notes (Signed)
[facility] Emergency Department Provider Note   ____________________________________________   First MD Initiated Contact with Patient 05/16/19 1621     (approximate)  I have reviewed the triage vital signs and the nursing notes.   HISTORY  Chief Complaint Abdominal Pain    HPI Maria Hodge is a 58 y.o. female who complains of 2 to 3 days of right sided crampy abdominal pain.  She reports is like labor pains getting worse not better and worse again.  She says it is not made better or worse by food or movement or the car going over the bumps in the road coming here.  She does not have nausea vomiting fever chills diarrhea or any other complaints.  She thought initially this was gas pain but it has been lasting for a long time and getting moderately severe at its worst.         Past Medical History:  Diagnosis Date  . Anxiety   . Arrhythmia   . Arthritis    hands  . Depression   . GERD (gastroesophageal reflux disease)   . Heart murmur   . Hypertension   . Wears dentures    partial upper    There are no active problems to display for this patient.   Past Surgical History:  Procedure Laterality Date  . BACK SURGERY  2010  . ORIF ANKLE FRACTURE Left 11/09/2017   Procedure: OPEN REDUCTION INTERNAL FIXATION (ORIF) ANKLE FRACTURE WITH POSSIBLE SYNDESMOSIS REPAIR;  Surgeon: Signa KellPatel, Sunny, MD;  Location[facility];  Service: Orthopedics;  Laterality: Left;  GENERAL WITH NERVE BLOCK ALEX SYNDEAMOSIS TIGHTROPE MARK ANKLE FRACTURE PLATES  C ARM  PLASTER SPLINT  . 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 Authorizing Provider  amitriptyline (ELAVIL) 150 MG tablet TAKE 1 TABLET BY MOUTH EVERYDAY AT BEDTIME 03/10/19   Clapacs, [name], MD  buPROPion (WELLBUTRIN XL) 150 MG 24 hr tablet Take 1 tablet (150 mg total) by mouth daily. 03/10/19   Clapacs, Jackquline DenmarkJohn T, MD  esomeprazole (NEXIUM) 20 MG capsule Take 20 mg by mouth daily.    [provider]  polyethylene glycol (MIRALAX) packet Take 17 g by mouth daily. 06/07/18   Jeanmarie PlantMcShane, James A, MD  QUEtiapine (SEROQUEL) 400 MG tablet TAKE 1 TABLET BY MOUTH TWICE A DAY 03/10/19   Clapacs, Jackquline DenmarkJohn T, MD  QUEtiapine (SEROQUEL) 400 MG tablet Take 1 tablet (400 mg total) by mouth 2 (two) times daily. 03/10/19   Clapacs, Jackquline DenmarkJohn T, MD  traMADol (ULTRAM) 50 MG tablet Take 1 tablet (50 mg total) by mouth 2 (two) times daily. 03/01/18   Menshew, Charlesetta IvoryJenise V Bacon, PA-C    Allergies Penicillins and Aspirin  Family History  Problem Relation Age of Onset  . Heart failure Mother   . Hypertension Mother   . Depression Mother   . Anxiety disorder Mother   . Cancer Father   . Rheum arthritis Father   . Heart failure Sister   . Rheum arthritis Sister   . Heart failure Sister   . Cancer Sister     Social History Social History   Tobacco Use  . Smoking status:  Current Every Day Smoker    Packs/day: 0.50    Types: Cigarettes    Start date: 06/05/1992  . Smokeless tobacco: Never Used  . Tobacco comment: down to 1/3 PPD  Substance Use Topics  . Alcohol use: No    Alcohol/week: 0.0 standard drinks  . Drug use: No    Review of Systems  Constitutional: No fever/chills Eyes: No visual changes. ENT: No sore throat. Cardiovascular: Denies chest pain. Respiratory: Denies shortness of breath. Gastrointestinal: See HPI Genitourinary: Negative for dysuria. Musculoskeletal: Negative for back pain. Skin: Negative for rash. Neurological: Negative for headaches, focal weakness   ____________________________________________   PHYSICAL EXAM:   VITAL SIGNS: ED Triage Vitals  Enc Vitals Group     BP 05/16/19 1459 (!) 131/99     Pulse Rate 05/16/19 1459 95     Resp 05/16/19 1459 17     Temp 05/16/19 1459 98.4 F (36.9 C)     Temp Source 05/16/19 1459 Oral     SpO2 05/16/19 1459 95 %     Weight 05/16/19 1500 145 lb (65.8 kg)     Height 05/16/19 1500 5\' 5"  (1.651 m)     Head Circumference --      Peak Flow --      Pain Score 05/16/19 1500 8     Pain Loc --      Pain Edu? --      Excl. in [place]? --     Constitutional: Alert and oriented. Well appearing and in no acute distress. Eyes: Conjunctivae are normal.  Head: Atraumatic. Nose: No congestion/rhinnorhea. Mouth/Throat: Mucous membranes are moist.  Oropharynx non-erythematous. Neck: No stridor.  Cardiovascular: Normal rate, regular rhythm. Grossly normal heart sounds.  Good peripheral circulation. Respiratory: Normal respiratory effort.  No retractions. Lungs CTAB. Gastrointestinal: Soft tender to palpation and slightly to percussion right side of the abdomen more upper than lower.  No distention. No abdominal bruits. No CVA tenderness. Musculoskeletal: No lower extremity tenderness nor edema. Neurologic:  Normal speech and language. No gross focal neurologic deficits are appreciated.  Skin:  Skin is warm, dry and intact. No rash noted.  ____________________________________________   LABS (all labs ordered are listed, but only abnormal results are displayed)  Labs Reviewed  COMPREHENSIVE METABOLIC PANEL - Abnormal; Notable for the following components:      Result Value   Total Protein 8.4 (*)    All other components within normal limits  URINALYSIS, COMPLETE (UACMP) WITH MICROSCOPIC - Abnormal; Notable for the following components:   Color, Urine YELLOW (*)    APPearance CLEAR (*)    Hgb urine dipstick SMALL (*)    All other components within normal limits  LIPASE, BLOOD  CBC   ____________________________________________  EKG    ____________________________________________  RADIOLOGY    Official radiology report(s): No results found.  ____________________________________________   PROCEDURES  Procedure(s) performed (including Critical Care):  Procedures   ____________________________________________   INITIAL IMPRESSION / ASSESSMENT AND PLAN / ED COURSE  Discussed patient with Dr. Vicente Males GI and Dr. Celine Ahr surgery.  It is not obstructed.  Patient is actually had this before.  We will give her some MiraLAX to try and relieve the constipation which hopefully will relieve the pain.  Additionally I will give her a little bit of Percocet with the understanding that that will slow down the relief of the problem.  We will have her follow-up with her primary care doctor.  ____________________________________________   FINAL CLINICAL IMPRESSION(S) / ED DIAGNOSES  Final diagnoses:  None     ED Discharge Orders    None       Note:  This document was prepared using Dragon voice recognition software and may include unintentional dictation errors.    Arnaldo Natal, MD 05/16/19 2047

## 2019-05-16 NOTE — ED Notes (Signed)
ED Provider at bedside. 

## 2019-05-16 NOTE — ED Notes (Signed)
Pt up to bathroom    Pt alert.  family with pt

## 2019-05-16 NOTE — Discharge Instructions (Signed)
Use MiraLAX 1 heaping teaspoon dissolved in 240 mL of water twice a day for a week.  That should relieve the constipation without a lot of pain.  If you have a moderate amount of pain you can take 1 Percocet twice a day but be aware that the Percocet will slow your bowels down and prevent the constipation from being relieved.  Please return for increasing pain fever or not having the pain relieved within 2 days or not having a number of large stools within 2 days.

## 2019-05-16 NOTE — ED Notes (Signed)
Pain meds given  siderails up x 2 

## 2019-05-19 ENCOUNTER — Other Ambulatory Visit: Payer: Self-pay | Admitting: Psychiatry

## 2019-07-31 ENCOUNTER — Other Ambulatory Visit: Payer: Self-pay | Admitting: Psychiatry

## 2019-08-03 ENCOUNTER — Other Ambulatory Visit: Payer: Self-pay | Admitting: Psychiatry

## 2019-08-08 ENCOUNTER — Ambulatory Visit (INDEPENDENT_AMBULATORY_CARE_PROVIDER_SITE_OTHER): Payer: Medicare Other | Admitting: Psychiatry

## 2019-08-08 ENCOUNTER — Other Ambulatory Visit: Payer: Self-pay

## 2019-08-08 ENCOUNTER — Encounter: Payer: Self-pay | Admitting: Psychiatry

## 2019-08-08 DIAGNOSIS — F411 Generalized anxiety disorder: Secondary | ICD-10-CM | POA: Diagnosis not present

## 2019-08-08 DIAGNOSIS — F3162 Bipolar disorder, current episode mixed, moderate: Secondary | ICD-10-CM

## 2019-08-08 MED ORDER — AMITRIPTYLINE HCL 150 MG PO TABS: ORAL_TABLET | ORAL | 5 refills | Status: DC

## 2019-08-08 MED ORDER — BUPROPION HCL ER (XL) 150 MG PO TB24: 150.0000 mg | ORAL_TABLET | Freq: Every day | ORAL | 5 refills | Status: DC

## 2019-08-08 MED ORDER — QUETIAPINE FUMARATE 400 MG PO TABS: 400.0000 mg | ORAL_TABLET | Freq: Two times a day (BID) | ORAL | 5 refills | Status: DC

## 2019-08-08 NOTE — Progress Notes (Signed)
Follow-up for this patient with chronic anxiety and depression.  Patient reached by telephone.  Appropriate conversation.  Patient has no complaints.  This year has been somewhat difficult a couple people close to her have died from the [place] virus.  Otherwise her mood however has been stable.  No major depression.  Sleep is adequate.  No return of any suicidal thinking.  Denies any substance abuse.  Denies any psychotic symptoms.  Calm and lucid voice.  Alert and oriented.  Good insight and judgment.  No sign of dangerousness.  Reviewed medication which is amitriptyline Wellbutrin and quetiapine.  Refill medications.  We can follow-up in 6 months.

## 2019-08-31 ENCOUNTER — Other Ambulatory Visit: Payer: Self-pay | Admitting: Psychiatry

## 2020-01-10 ENCOUNTER — Telehealth: Payer: Self-pay

## 2020-01-10 ENCOUNTER — Other Ambulatory Visit: Payer: Self-pay | Admitting: Psychiatry

## 2020-01-10 DIAGNOSIS — F411 Generalized anxiety disorder: Secondary | ICD-10-CM

## 2020-01-10 DIAGNOSIS — F3162 Bipolar disorder, current episode mixed, moderate: Secondary | ICD-10-CM

## 2020-01-10 MED ORDER — QUETIAPINE FUMARATE 400 MG PO TABS: 400.0000 mg | ORAL_TABLET | Freq: Two times a day (BID) | ORAL | 0 refills | Status: DC

## 2020-01-10 MED ORDER — AMITRIPTYLINE HCL 150 MG PO TABS: ORAL_TABLET | ORAL | 0 refills | Status: DC

## 2020-01-10 MED ORDER — BUPROPION HCL ER (XL) 150 MG PO TB24: 150.0000 mg | ORAL_TABLET | Freq: Every day | ORAL | 0 refills | Status: DC

## 2020-01-10 NOTE — Telephone Encounter (Signed)
I have sent Seroquel, Wellbutrin, amitriptyline.

## 2020-01-10 NOTE — Telephone Encounter (Signed)
I have sent her medications to the pharmacy for a month supply.

## 2020-01-10 NOTE — Telephone Encounter (Signed)
pt needs enough medication to get to see dr. Toni Amend on 01-18-20 of wellbutrin xl and seroquel 400mg 

## 2020-01-18 ENCOUNTER — Telehealth (INDEPENDENT_AMBULATORY_CARE_PROVIDER_SITE_OTHER): Payer: Medicare Other | Admitting: Psychiatry

## 2020-01-18 ENCOUNTER — Other Ambulatory Visit: Payer: Self-pay

## 2020-01-18 ENCOUNTER — Encounter: Payer: Self-pay | Admitting: Psychiatry

## 2020-01-18 DIAGNOSIS — F411 Generalized anxiety disorder: Secondary | ICD-10-CM | POA: Diagnosis not present

## 2020-01-18 DIAGNOSIS — F3162 Bipolar disorder, current episode mixed, moderate: Secondary | ICD-10-CM

## 2020-01-18 MED ORDER — BUPROPION HCL ER (XL) 150 MG PO TB24: 150.0000 mg | ORAL_TABLET | Freq: Every day | ORAL | 3 refills | Status: DC

## 2020-01-18 MED ORDER — QUETIAPINE FUMARATE 400 MG PO TABS: 400.0000 mg | ORAL_TABLET | Freq: Two times a day (BID) | ORAL | 3 refills | Status: DC

## 2020-01-18 MED ORDER — AMITRIPTYLINE HCL 150 MG PO TABS: ORAL_TABLET | ORAL | 3 refills | Status: DC

## 2020-01-18 MED ORDER — CLONAZEPAM 0.5 MG PO TABS: 0.5000 mg | ORAL_TABLET | Freq: Two times a day (BID) | ORAL | 3 refills | Status: DC

## 2020-01-18 NOTE — Progress Notes (Signed)
Follow-up for this patient with anxiety and depression.  Patient reached by telephone and identified.  Reports mood is been stressed out anxious and depressed.  Husband diagnosed with cancer.  She is sleeping adequately most of the time but feels nervous during the day.  Feels like she has nothing to occupy her mind.  Anxious sounding affect.  Thoughts lucid.  Denies suicidal or homicidal ideation.  Denies psychotic symptoms.  Confirmed with patient that she is not currently taking any narcotic pain medicine.  Supportive counseling and therapy around management of anxiety and depression.  Refilled current medicine after reviewing doses.  Start clonazepam 0.5 mg twice a day as needed for anxiety.  Follow-up in another 3 months.

## 2020-02-01 ENCOUNTER — Other Ambulatory Visit: Payer: Self-pay | Admitting: Psychiatry

## 2020-02-01 DIAGNOSIS — F3162 Bipolar disorder, current episode mixed, moderate: Secondary | ICD-10-CM

## 2020-05-06 ENCOUNTER — Other Ambulatory Visit: Payer: Self-pay | Admitting: Psychiatry

## 2020-05-08 ENCOUNTER — Other Ambulatory Visit: Payer: Self-pay | Admitting: Psychiatry

## 2020-05-08 MED ORDER — CLONAZEPAM 0.5 MG PO TABS: 0.5000 mg | ORAL_TABLET | Freq: Two times a day (BID) | ORAL | 3 refills | Status: DC

## 2020-07-21 ENCOUNTER — Other Ambulatory Visit: Payer: Self-pay | Admitting: Psychiatry

## 2020-07-21 DIAGNOSIS — F3162 Bipolar disorder, current episode mixed, moderate: Secondary | ICD-10-CM

## 2020-07-23 ENCOUNTER — Telehealth: Payer: Self-pay | Admitting: Psychiatry

## 2020-07-23 DIAGNOSIS — F3162 Bipolar disorder, current episode mixed, moderate: Secondary | ICD-10-CM

## 2020-07-23 MED ORDER — QUETIAPINE FUMARATE 400 MG PO TABS: 400.0000 mg | ORAL_TABLET | Freq: Two times a day (BID) | ORAL | 0 refills | Status: DC

## 2020-07-23 NOTE — Telephone Encounter (Signed)
Received this below message from [facility]-  'pt called states she is completely out of her seroquel 400mg . pt was told that she should have enough to do until 12-10 but she states she doesnt she doesnt know what happen. pharmacy confirmed that she should have enough until 12-11 because she picked up on the 9-11 for a 90 day supply. I called pt back to ask her to recheck and see if she had a extra bottle. pt states she does not have any she said that she was looking when she got off the phone with me the 1st time. she stated she will just do without. pt was told that i would ask Dr. 14-10 to at least call you in enough to get to her appointment on 07-30-20.'    Patient did receive medication enough to last until December.  Since Dr. January is out of office, will give her a short supply of medication to last until he is back.  We also sent this message to Dr. Toni Amend.

## 2020-07-25 ENCOUNTER — Other Ambulatory Visit: Payer: Self-pay | Admitting: Psychiatry

## 2020-07-25 DIAGNOSIS — F3162 Bipolar disorder, current episode mixed, moderate: Secondary | ICD-10-CM

## 2020-07-25 MED ORDER — QUETIAPINE FUMARATE 400 MG PO TABS: 400.0000 mg | ORAL_TABLET | Freq: Two times a day (BID) | ORAL | 5 refills | Status: DC

## 2020-07-30 ENCOUNTER — Other Ambulatory Visit: Payer: Self-pay

## 2020-07-30 ENCOUNTER — Encounter: Payer: Self-pay | Admitting: Psychiatry

## 2020-07-30 ENCOUNTER — Telehealth (INDEPENDENT_AMBULATORY_CARE_PROVIDER_SITE_OTHER): Payer: Medicare Other | Admitting: Psychiatry

## 2020-07-30 DIAGNOSIS — F3162 Bipolar disorder, current episode mixed, moderate: Secondary | ICD-10-CM

## 2020-07-30 DIAGNOSIS — F411 Generalized anxiety disorder: Secondary | ICD-10-CM

## 2020-07-30 MED ORDER — CLONAZEPAM 0.5 MG PO TABS: 0.5000 mg | ORAL_TABLET | Freq: Two times a day (BID) | ORAL | 5 refills | Status: DC

## 2020-07-30 MED ORDER — QUETIAPINE FUMARATE 400 MG PO TABS: 400.0000 mg | ORAL_TABLET | Freq: Two times a day (BID) | ORAL | 5 refills | Status: DC

## 2020-07-30 MED ORDER — BUPROPION HCL ER (XL) 150 MG PO TB24: 150.0000 mg | ORAL_TABLET | Freq: Every day | ORAL | 5 refills | Status: DC

## 2020-07-30 MED ORDER — AMITRIPTYLINE HCL 150 MG PO TABS: ORAL_TABLET | ORAL | 5 refills | Status: DC

## 2020-07-30 NOTE — Progress Notes (Signed)
Virtual Visit via Telephone Note  I connected with Maria Hodge on 07/30/20 at  1:20 PM EST by telephone and verified that I am speaking with the correct person using two identifiers.  Location: Patient: Home Provider: Hospital   I discussed the limitations, risks, security and privacy concerns of performing an evaluation and management service by telephone and the availability of in person appointments. I also discussed with the patient that there may be a patient responsible charge related to this service. The patient expressed understanding and agreed to proceed.   History of Present Illness: Patient reached by telephone.  Identified myself and her.  Patient has no new complaints.  Mood continues to have its normal ups and downs.  Life gets lonely at times.  No suicidal thoughts no psychosis.  Pain under adequate control.  Tolerating medicines well without side effects.  Sleeps adequately at night.    Observations/Objective: Patient was on time alert cooperative.  Affect reactive although a little bit dysphoric sounding.  Admits to feeling a little bit down but denies any suicidal thought.  No psychotic symptoms.  No suicidal ideation   Assessment and Plan: Reviewed all medications including side effects and usage and doses.  Refilled all prescriptions at [facility].  We can talk again in 6 months supportive counseling and psychoeducation done   Follow Up Instructions: Call for follow-up 6 months    I discussed the assessment and treatment plan with the patient. The patient was provided an opportunity to ask questions and all were answered. The patient agreed with the plan and demonstrated an understanding of the instructions.   The patient was advised to call back or seek an in-person evaluation if the symptoms worsen or if the condition fails to improve as anticipated.  I provided 20 minutes of non-face-to-face time during this encounter.   Mordecai Rasmussen, MD

## 2020-12-30 ENCOUNTER — Emergency Department
Admission: EM | Admit: 2020-12-30 | Discharge: 2020-12-31 | Disposition: A | Discharge status: Home / Self Care | Payer: Medicare Other | Attending: Emergency Medicine | Admitting: Emergency Medicine

## 2020-12-30 ENCOUNTER — Encounter: Payer: Self-pay | Admitting: Emergency Medicine

## 2020-12-30 DIAGNOSIS — I10 Essential (primary) hypertension: Secondary | ICD-10-CM | POA: Insufficient documentation

## 2020-12-30 DIAGNOSIS — R112 Nausea with vomiting, unspecified: Secondary | ICD-10-CM | POA: Diagnosis present

## 2020-12-30 DIAGNOSIS — Z79899 Other long term (current) drug therapy: Secondary | ICD-10-CM | POA: Diagnosis not present

## 2020-12-30 DIAGNOSIS — F17210 Nicotine dependence, cigarettes, uncomplicated: Secondary | ICD-10-CM | POA: Insufficient documentation

## 2020-12-30 DIAGNOSIS — K219 Gastro-esophageal reflux disease without esophagitis: Secondary | ICD-10-CM | POA: Diagnosis not present

## 2020-12-30 DIAGNOSIS — R1013 Epigastric pain: Secondary | ICD-10-CM

## 2020-12-30 DIAGNOSIS — E860 Dehydration: Secondary | ICD-10-CM

## 2020-12-30 LAB — URINALYSIS, COMPLETE (UACMP) WITH MICROSCOPIC
Bacteria, UA: NONE SEEN
Bilirubin Urine: NEGATIVE
Glucose, UA: NEGATIVE mg/dL
Ketones, ur: 80 mg/dL — AB
Leukocytes,Ua: NEGATIVE
Nitrite: NEGATIVE
Protein, ur: 100 mg/dL — AB
Specific Gravity, Urine: 1.024 (ref 1.005–1.030)
pH: 6 (ref 5.0–8.0)

## 2020-12-30 LAB — CBC
HCT: 50.1 % — ABNORMAL HIGH (ref 36.0–46.0)
Hemoglobin: 17.2 g/dL — ABNORMAL HIGH (ref 12.0–15.0)
MCH: 29.9 pg (ref 26.0–34.0)
MCHC: 34.3 g/dL (ref 30.0–36.0)
MCV: 87.1 fL (ref 80.0–100.0)
Platelets: 205 10*3/uL (ref 150–400)
RBC: 5.75 MIL/uL — ABNORMAL HIGH (ref 3.87–5.11)
RDW: 17.7 % — ABNORMAL HIGH (ref 11.5–15.5)
WBC: 13 10*3/uL — ABNORMAL HIGH (ref 4.0–10.5)
nRBC: 0 % (ref 0.0–0.2)

## 2020-12-30 LAB — COMPREHENSIVE METABOLIC PANEL
ALT: 12 U/L (ref 0–44)
AST: 16 U/L (ref 15–41)
Albumin: 4.4 g/dL (ref 3.5–5.0)
Alkaline Phosphatase: 79 U/L (ref 38–126)
Anion gap: 19 — ABNORMAL HIGH (ref 5–15)
BUN: 11 mg/dL (ref 6–20)
CO2: 20 mmol/L — ABNORMAL LOW (ref 22–32)
Calcium: 9.8 mg/dL (ref 8.9–10.3)
Chloride: 98 mmol/L (ref 98–111)
Creatinine, Ser: 0.73 mg/dL (ref 0.44–1.00)
GFR, Estimated: >60 mL/min (ref >60)
Glucose, Bld: 95 mg/dL (ref 70–99)
Potassium: 3.6 mmol/L (ref 3.5–5.1)
Sodium: 137 mmol/L (ref 135–145)
Total Bilirubin: 1.5 mg/dL — ABNORMAL HIGH (ref 0.3–1.2)
Total Protein: 8.5 g/dL — ABNORMAL HIGH (ref 6.5–8.1)

## 2020-12-30 LAB — LIPASE, BLOOD: Lipase: 27 U/L (ref 11–51)

## 2020-12-30 LAB — TROPONIN I (HIGH SENSITIVITY): Troponin I (High Sensitivity): 5 ng/L (ref <18)

## 2020-12-30 MED ORDER — ONDANSETRON 4 MG PO TBDP: 4.0000 mg | ORAL_TABLET | Freq: Three times a day (TID) | ORAL | 0 refills | Status: DC | PRN

## 2020-12-30 MED ORDER — SODIUM CHLORIDE 0.9 % IV BOLUS
1000.0000 mL | Freq: Once | INTRAVENOUS | Status: AC
  Administered 2020-12-30: 1000 mL via INTRAVENOUS

## 2020-12-30 MED ORDER — ONDANSETRON HCL 4 MG/2ML IJ SOLN
4.0000 mg | Freq: Once | INTRAMUSCULAR | Status: AC
Start: 2020-12-30 — End: 2020-12-30
  Administered 2020-12-30: 4 mg via INTRAVENOUS
  Filled 2020-12-30: qty 2

## 2020-12-30 NOTE — ED Provider Notes (Signed)
[facility] Emergency Department Provider Note   ____________________________________________   Event Date/Time   First MD Initiated Contact with Patient 12/30/20 1959     (approximate)  I have reviewed the triage vital signs and the nursing notes.   HISTORY  Chief Complaint Emesis    HPI Maria Hodge is a 60 y.o. female with the below stated past medical history who presents for 3 days of nausea/vomiting with 1 day of abdominal cramping and soreness that is worse with emesis.  Patient denies any bloody emesis.  Patient denies any diarrhea or worsening abdominal pain.  Patient denies any recent sick contacts, recent travel, or food out of the ordinary.  Patient denies any exacerbating or relieving factors but states that she has been p.o. intolerant for the last 3 days.  Patient currently denies any vision changes, tinnitus, difficulty speaking, facial droop, sore throat, chest pain, shortness of breath, diarrhea, dysuria, or weakness/numbness/paresthesias in any extremity         Past Medical History:  Diagnosis Date  . Anxiety   . Arrhythmia   . Arthritis    hands  . Depression   . GERD (gastroesophageal reflux disease)   . Heart murmur   . Hypertension   . Wears dentures    partial upper    There are no problems to display for this patient.   Past Surgical History:  Procedure Laterality Date  . BACK SURGERY  2010  . ORIF ANKLE FRACTURE Left 11/09/2017   Procedure: OPEN REDUCTION INTERNAL FIXATION (ORIF) ANKLE FRACTURE WITH POSSIBLE SYNDESMOSIS REPAIR;  Surgeon: Signa Kell, MD;  Location: [facility];  Service: Orthopedics;  Laterality: Left;  GENERAL WITH NERVE BLOCK ALEX SYNDEAMOSIS TIGHTROPE MARK ANKLE FRACTURE PLATES  C ARM  PLASTER SPLINT  . 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 Authorizing Provider  amitriptyline (ELAVIL) 150 MG tablet TAKE 1 TABLET BY MOUTH EVERYDAY AT BEDTIME 07/30/20   Clapacs, [name], MD  buPROPion (WELLBUTRIN XL) 150 MG 24 hr tablet Take 1 tablet (150 mg total) by mouth daily. 07/30/20   Clapacs, Jackquline Denmark, MD  clonazePAM (KLONOPIN) 0.5 MG tablet TAKE 1 TABLET BY MOUTH 2 TIMES DAILY. 05/16/20   Clapacs, Jackquline Denmark, MD  clonazePAM (KLONOPIN) 0.5 MG tablet Take 1 tablet (0.5 mg total) by mouth 2 (two) times daily. 07/30/20 07/30/21  Clapacs, Jackquline Denmark, MD  esomeprazole (NEXIUM) 20 MG capsule Take 20 mg by mouth daily.    [provider]  polyethylene glycol (MIRALAX) packet Take 17 g by mouth daily. 06/07/18   Jeanmarie Plant, MD  QUEtiapine (SEROQUEL) 400 MG tablet Take 1 tablet (400 mg total) by mouth 2 (two) times daily. 01/18/20   Clapacs, Jackquline Denmark, MD  QUEtiapine (SEROQUEL) 400 MG tablet TAKE 1 TABLET BY MOUTH TWICE A DAY 07/25/20   Clapacs, Jackquline Denmark, MD  QUEtiapine (SEROQUEL) 400 MG tablet Take 1 tablet (400 mg total) by mouth 2 (two) times daily. 07/23/20   Jomarie Longs, MD  QUEtiapine (SEROQUEL) 400 MG tablet Take 1 tablet (400 mg total) by mouth 2 (two) times daily. 07/25/20   Clapacs, Jackquline Denmark, MD  QUEtiapine (SEROQUEL)  400 MG tablet Take 1 tablet (400 mg total) by mouth 2 (two) times daily. 07/30/20   Clapacs, Jackquline Denmark, MD  traMADol (ULTRAM) 50 MG tablet Take 1 tablet (50 mg total) by mouth 2 (two) times daily. 03/01/18   Menshew, Charlesetta Ivory, PA-C    Allergies Penicillins and Aspirin  Family History  Problem Relation Age of Onset  . Heart failure Mother   . Hypertension Mother   . Depression Mother   . Anxiety disorder Mother   . Cancer Father   . Rheum arthritis Father   . Heart failure Sister   . Rheum  arthritis Sister   . Heart failure Sister   . Cancer Sister     Social History Social History   Tobacco Use  . Smoking status: Current Every Day Smoker    Packs/day: 0.50    Types: Cigarettes    Start date: 06/05/1992  . Smokeless tobacco: Never Used  . Tobacco comment: down to 1/3 PPD  Vaping Use  . Vaping Use: Never used  Substance Use Topics  . Alcohol use: No    Alcohol/week: 0.0 standard drinks  . Drug use: No    Review of Systems Constitutional: No fever/chills Eyes: No visual changes. ENT: No sore throat. Cardiovascular: Denies chest pain. Respiratory: Denies shortness of breath. Gastrointestinal: Endorses abdominal pain, nausea, and vomiting. No diarrhea. Genitourinary: Negative for dysuria. Musculoskeletal: Negative for acute arthralgias Skin: Negative for rash. Neurological: Negative for headaches, weakness/numbness/paresthesias in any extremity Psychiatric: Negative for suicidal ideation/homicidal ideation   ____________________________________________   PHYSICAL EXAM:  VITAL SIGNS: ED Triage Vitals [12/30/20 1934]  Enc Vitals Group     BP (!) 136/93     Pulse Rate 95     Resp 16     Temp 98.9 F (37.2 C)     Temp Source Oral     SpO2 97 %     Weight 120 lb (54.4 kg)     Height 5\' 5"  (1.651 m)     Head Circumference      Peak Flow      Pain Score      Pain Loc      Pain Edu?      Excl. in GC?    Constitutional: Alert and oriented. Well appearing and in no acute distress. Eyes: Conjunctivae are normal. PERRL. Head: Atraumatic. Nose: No congestion/rhinnorhea. Mouth/Throat: Mucous membranes are moist. Neck: No stridor Cardiovascular: Grossly normal heart sounds.  Good peripheral circulation. Respiratory: Normal respiratory effort.  No retractions. Gastrointestinal: Soft and nontender. No distention. Musculoskeletal: No obvious deformities Neurologic:  Normal speech and language. No gross focal neurologic deficits are appreciated. Skin:   Skin is warm and dry. No rash noted. Psychiatric: Mood and affect are normal. Speech and behavior are normal.  ____________________________________________   LABS (all labs ordered are listed, but only abnormal results are displayed)  Labs Reviewed  COMPREHENSIVE METABOLIC PANEL - Abnormal; Notable for the following components:      Result Value   CO2 20 (*)    Total Protein 8.5 (*)    Total Bilirubin 1.5 (*)    Anion gap 19 (*)    All other components within normal limits  CBC - Abnormal; Notable for the following components:   WBC 13.0 (*)    RBC 5.75 (*)    Hemoglobin 17.2 (*)    HCT 50.1 (*)    RDW 17.7 (*)    All other components within normal limits  LIPASE, BLOOD  URINALYSIS, COMPLETE (UACMP) WITH MICROSCOPIC  TROPONIN I (HIGH SENSITIVITY)   ____________________________________________  EKG  ED ECG REPORT I, Merwyn Katos, the attending physician, personally viewed and interpreted this ECG.  Date: 12/30/2020 EKG Time: 1943 Rate: 78 Rhythm: normal sinus rhythm QRS Axis: normal Intervals: normal ST/T Wave abnormalities: normal Narrative Interpretation: no evidence of acute ischemia  PROCEDURES  Procedure(s) performed (including Critical Care):  .1-3 Lead EKG Interpretation Performed by: Merwyn Katos, MD Authorized by: Merwyn Katos, MD     Interpretation: normal     ECG rate:  92   ECG rate assessment: normal     Rhythm: sinus rhythm     Ectopy: none     Conduction: normal       ____________________________________________   INITIAL IMPRESSION / ASSESSMENT AND PLAN / ED COURSE  As part of my medical decision making, I reviewed the following data within the electronic MEDICAL RECORD NUMBER Nursing notes reviewed and incorporated, Labs reviewed, EKG interpreted, Old chart reviewed, Radiograph reviewed and Notes from prior ED visits reviewed and incorporated        Patient presents for acute nausea/vomiting The cause of the patients symptoms  is not clear, but the patient is overall well appearing and is suspected to have a transient course of illness.  Given History and Exam there does not appear to be an emergent cause of the symptoms such as small bowel obstruction, coronary syndrome, bowel ischemia, DKA, pancreatitis, appendicitis, other acute abdomen or other emergent problem.  Reassessment: After treatment, the patient is feeling much better, tolerating PO fluids, and shows no signs of dehydration.   Disposition: Discharge home with prompt primary care physician follow up in the next 48 hours. Strict return precautions discussed.      ____________________________________________   FINAL CLINICAL IMPRESSION(S) / ED DIAGNOSES  Final diagnoses:  None     ED Discharge Orders    None       Note:  This document was prepared using Dragon voice recognition software and may include unintentional dictation errors.   Merwyn Katos, MD 12/30/20 [phone]

## 2020-12-30 NOTE — ED Triage Notes (Signed)
Pt c/o intermittent HA with N/V, no diarrhea x3 days as well as abdominal cramping and soreness.

## 2020-12-31 NOTE — ED Notes (Signed)
Pt ambulatory to POV with slow steady gait. VSS. NAD. All questions and concerns addressed.

## 2021-02-11 ENCOUNTER — Other Ambulatory Visit: Payer: Self-pay | Admitting: Psychiatry

## 2021-02-18 ENCOUNTER — Telehealth (HOSPITAL_BASED_OUTPATIENT_CLINIC_OR_DEPARTMENT_OTHER): Payer: Medicare Other | Admitting: Psychiatry

## 2021-02-18 ENCOUNTER — Other Ambulatory Visit: Payer: Self-pay

## 2021-02-18 DIAGNOSIS — F411 Generalized anxiety disorder: Secondary | ICD-10-CM | POA: Diagnosis not present

## 2021-02-18 DIAGNOSIS — F3162 Bipolar disorder, current episode mixed, moderate: Secondary | ICD-10-CM

## 2021-02-18 MED ORDER — CLONAZEPAM 0.5 MG PO TABS: 0.5000 mg | ORAL_TABLET | Freq: Two times a day (BID) | ORAL | 5 refills | Status: DC

## 2021-02-18 MED ORDER — QUETIAPINE FUMARATE 400 MG PO TABS: 400.0000 mg | ORAL_TABLET | Freq: Two times a day (BID) | ORAL | 5 refills | Status: DC

## 2021-02-18 MED ORDER — AMITRIPTYLINE HCL 150 MG PO TABS: ORAL_TABLET | ORAL | 5 refills | Status: DC

## 2021-02-18 MED ORDER — BUPROPION HCL ER (XL) 150 MG PO TB24: 150.0000 mg | ORAL_TABLET | Freq: Every day | ORAL | 5 refills | Status: DC

## 2021-02-19 NOTE — Progress Notes (Signed)
Virtual Visit via Telephone Note  I connected with Maria Hodge on 02/19/21 at  1:20 PM EDT by telephone and verified that I am speaking with the correct person using two identifiers.  Location: Patient: Home Provider: Hospital   I discussed the limitations, risks, security and privacy concerns of performing an evaluation and management service by telephone and the availability of in person appointments. I also discussed with the patient that there may be a patient responsible charge related to this service. The patient expressed understanding and agreed to proceed.   History of Present Illness: Contacted patient on the phone.  She was on time and appropriate and prepared.  Reports that in the last 6 months her mood has generally been good.  Her husband's health problems are a major stress and sometimes she gets down about it but no major depression.  No psychosis evident no suicidal thought.  Tolerating medicine well.    Observations/Objective: Tolerating medication.  No new complaints.  Euthymic affect.  Thoughts appear lucid no evidence of suicidality or dangerousness   Assessment and Plan: Refill all medicines.  Reviewed medication plan with patient risks benefits and side effects.  Follow-up 6 months   Follow Up Instructions: Continue current medication plan follow-up 6 months    I discussed the assessment and treatment plan with the patient. The patient was provided an opportunity to ask questions and all were answered. The patient agreed with the plan and demonstrated an understanding of the instructions.   The patient was advised to call back or seek an in-person evaluation if the symptoms worsen or if the condition fails to improve as anticipated.  I provided 20 minutes of non-face-to-face time during this encounter.   Mordecai Rasmussen, MD

## 2021-08-08 ENCOUNTER — Other Ambulatory Visit: Payer: Self-pay | Admitting: Psychiatry

## 2021-08-08 MED ORDER — CLONAZEPAM 0.5 MG PO TABS: 0.5000 mg | ORAL_TABLET | Freq: Two times a day (BID) | ORAL | 3 refills | Status: DC

## 2021-08-12 ENCOUNTER — Other Ambulatory Visit: Payer: Self-pay

## 2021-08-12 ENCOUNTER — Telehealth (INDEPENDENT_AMBULATORY_CARE_PROVIDER_SITE_OTHER): Payer: Medicare Other | Admitting: Psychiatry

## 2021-08-12 DIAGNOSIS — F411 Generalized anxiety disorder: Secondary | ICD-10-CM | POA: Diagnosis not present

## 2021-08-12 DIAGNOSIS — F3162 Bipolar disorder, current episode mixed, moderate: Secondary | ICD-10-CM | POA: Diagnosis not present

## 2021-08-12 MED ORDER — QUETIAPINE FUMARATE 400 MG PO TABS: 400.0000 mg | ORAL_TABLET | Freq: Two times a day (BID) | ORAL | 5 refills | Status: DC

## 2021-08-12 MED ORDER — BUPROPION HCL ER (XL) 150 MG PO TB24: 150.0000 mg | ORAL_TABLET | Freq: Every day | ORAL | 5 refills | Status: DC

## 2021-08-12 MED ORDER — AMITRIPTYLINE HCL 150 MG PO TABS: ORAL_TABLET | ORAL | 5 refills | Status: DC

## 2021-08-12 MED ORDER — CLONAZEPAM 0.5 MG PO TABS: 0.5000 mg | ORAL_TABLET | Freq: Two times a day (BID) | ORAL | 5 refills | Status: DC

## 2021-08-12 NOTE — Progress Notes (Signed)
Virtual Visit via Telephone Note  I connected with KAWANDA DRUMHELLER on 08/12/21 at  1:00 PM EST by telephone and verified that I am speaking with the correct person using two identifiers.  Location: Patient: Home Provider: Hospital   I discussed the limitations, risks, security and privacy concerns of performing an evaluation and management service by telephone and the availability of in person appointments. I also discussed with the patient that there may be a patient responsible charge related to this service. The patient expressed understanding and agreed to proceed.   History of Present Illness: Patient reached by telephone.  35-month checkup for this patient with chronic depression and anxiety.  She has no new complaints.  Patient says her mood has been pretty stable.  No dramatic changes in her life.  She got through her husband having COVID but he survived and they are both doing okay now.  Mood has been okay.  Denies psychotic symptoms denies suicidal ideation.  Denies any side effects from medication    Observations/Objective: Alert and oriented.  Cooperative.  Good insight and judgment.  Affect euthymic thoughts lucid no loosening of associations.  Understands use of medication and appears able to make reasonable decisions   Assessment and Plan: Stable and doing well on current combination of medicine for chronic mood instability and depression and anxiety.  Reviewed medicine list.  Everything refilled for 6 months   Follow Up Instructions:    I discussed the assessment and treatment plan with the patient. The patient was provided an opportunity to ask questions and all were answered. The patient agreed with the plan and demonstrated an understanding of the instructions.   The patient was advised to call back or seek an in-person evaluation if the symptoms worsen or if the condition fails to improve as anticipated.  I provided 20 minutes of non-face-to-face time during this  encounter.   Mordecai Rasmussen, MD

## 2021-11-13 ENCOUNTER — Other Ambulatory Visit: Payer: Self-pay

## 2021-11-13 ENCOUNTER — Emergency Department: Payer: Medicare HMO

## 2021-11-13 ENCOUNTER — Inpatient Hospital Stay
Admission: EM | Admit: 2021-11-13 | Discharge: 2021-11-16 | DRG: 389 | Discharge status: Against Medical Advice | Payer: Medicare HMO | Attending: Internal Medicine | Admitting: Internal Medicine

## 2021-11-13 DIAGNOSIS — Z818 Family history of other mental and behavioral disorders: Secondary | ICD-10-CM | POA: Diagnosis not present

## 2021-11-13 DIAGNOSIS — F17210 Nicotine dependence, cigarettes, uncomplicated: Secondary | ICD-10-CM | POA: Diagnosis present

## 2021-11-13 DIAGNOSIS — K219 Gastro-esophageal reflux disease without esophagitis: Secondary | ICD-10-CM | POA: Diagnosis not present

## 2021-11-13 DIAGNOSIS — K56609 Unspecified intestinal obstruction, unspecified as to partial versus complete obstruction: Secondary | ICD-10-CM | POA: Diagnosis not present

## 2021-11-13 DIAGNOSIS — E876 Hypokalemia: Secondary | ICD-10-CM | POA: Diagnosis not present

## 2021-11-13 DIAGNOSIS — Q790 Congenital diaphragmatic hernia: Principal | ICD-10-CM

## 2021-11-13 DIAGNOSIS — K449 Diaphragmatic hernia without obstruction or gangrene: Secondary | ICD-10-CM | POA: Diagnosis not present

## 2021-11-13 DIAGNOSIS — Z6821 Body mass index (BMI) 21.0-21.9, adult: Secondary | ICD-10-CM | POA: Diagnosis not present

## 2021-11-13 DIAGNOSIS — F419 Anxiety disorder, unspecified: Secondary | ICD-10-CM | POA: Diagnosis present

## 2021-11-13 DIAGNOSIS — Z880 Allergy status to penicillin: Secondary | ICD-10-CM

## 2021-11-13 DIAGNOSIS — Z886 Allergy status to analgesic agent status: Secondary | ICD-10-CM

## 2021-11-13 DIAGNOSIS — E440 Moderate protein-calorie malnutrition: Secondary | ICD-10-CM | POA: Insufficient documentation

## 2021-11-13 DIAGNOSIS — I10 Essential (primary) hypertension: Secondary | ICD-10-CM | POA: Diagnosis not present

## 2021-11-13 DIAGNOSIS — Z4659 Encounter for fitting and adjustment of other gastrointestinal appliance and device: Secondary | ICD-10-CM

## 2021-11-13 DIAGNOSIS — Z20822 Contact with and (suspected) exposure to covid-19: Secondary | ICD-10-CM | POA: Diagnosis not present

## 2021-11-13 DIAGNOSIS — F319 Bipolar disorder, unspecified: Secondary | ICD-10-CM

## 2021-11-13 DIAGNOSIS — Z8249 Family history of ischemic heart disease and other diseases of the circulatory system: Secondary | ICD-10-CM

## 2021-11-13 DIAGNOSIS — Z79899 Other long term (current) drug therapy: Secondary | ICD-10-CM

## 2021-11-13 DIAGNOSIS — K6389 Other specified diseases of intestine: Secondary | ICD-10-CM | POA: Diagnosis not present

## 2021-11-13 DIAGNOSIS — Z23 Encounter for immunization: Secondary | ICD-10-CM | POA: Diagnosis not present

## 2021-11-13 DIAGNOSIS — N3289 Other specified disorders of bladder: Secondary | ICD-10-CM | POA: Diagnosis not present

## 2021-11-13 DIAGNOSIS — Z4682 Encounter for fitting and adjustment of non-vascular catheter: Secondary | ICD-10-CM | POA: Diagnosis not present

## 2021-11-13 DIAGNOSIS — R1032 Left lower quadrant pain: Secondary | ICD-10-CM | POA: Diagnosis not present

## 2021-11-13 LAB — URINALYSIS, ROUTINE W REFLEX MICROSCOPIC
Bacteria, UA: NONE SEEN
Glucose, UA: NEGATIVE mg/dL
Ketones, ur: 5 mg/dL — AB
Leukocytes,Ua: NEGATIVE
Nitrite: NEGATIVE
Protein, ur: 100 mg/dL — AB
Specific Gravity, Urine: 1.024 (ref 1.005–1.030)
Squamous Epithelial / HPF: NONE SEEN (ref 0–5)
WBC, UA: NONE SEEN WBC/hpf (ref 0–5)
pH: 8 (ref 5.0–8.0)

## 2021-11-13 LAB — CBC
HCT: 49.3 % — ABNORMAL HIGH (ref 36.0–46.0)
Hemoglobin: 16.2 g/dL — ABNORMAL HIGH (ref 12.0–15.0)
MCH: 29.9 pg (ref 26.0–34.0)
MCHC: 32.9 g/dL (ref 30.0–36.0)
MCV: 91 fL (ref 80.0–100.0)
Platelets: 206 10*3/uL (ref 150–400)
RBC: 5.42 MIL/uL — ABNORMAL HIGH (ref 3.87–5.11)
RDW: 15.8 % — ABNORMAL HIGH (ref 11.5–15.5)
WBC: 7.3 10*3/uL (ref 4.0–10.5)
nRBC: 0 % (ref 0.0–0.2)

## 2021-11-13 LAB — COMPREHENSIVE METABOLIC PANEL
ALT: 20 U/L (ref 0–44)
AST: 23 U/L (ref 15–41)
Albumin: 4.2 g/dL (ref 3.5–5.0)
Alkaline Phosphatase: 88 U/L (ref 38–126)
Anion gap: 14 (ref 5–15)
BUN: 10 mg/dL (ref 6–20)
CO2: 28 mmol/L (ref 22–32)
Calcium: 9.7 mg/dL (ref 8.9–10.3)
Chloride: 97 mmol/L — ABNORMAL LOW (ref 98–111)
Creatinine, Ser: 0.84 mg/dL (ref 0.44–1.00)
GFR, Estimated: >60 mL/min (ref >60)
Glucose, Bld: 114 mg/dL — ABNORMAL HIGH (ref 70–99)
Potassium: 3.7 mmol/L (ref 3.5–5.1)
Sodium: 139 mmol/L (ref 135–145)
Total Bilirubin: 0.4 mg/dL (ref 0.3–1.2)
Total Protein: 9.2 g/dL — ABNORMAL HIGH (ref 6.5–8.1)

## 2021-11-13 LAB — LIPASE, BLOOD: Lipase: 31 U/L (ref 11–51)

## 2021-11-13 MED ORDER — ONDANSETRON HCL 4 MG/2ML IJ SOLN: 4.0000 mg | Freq: Once | INTRAMUSCULAR | Status: AC

## 2021-11-13 MED ORDER — IOHEXOL 300 MG/ML  SOLN
100.0000 mL | Freq: Once | INTRAMUSCULAR | Status: AC | PRN
  Administered 2021-11-13: 100 mL via INTRAVENOUS

## 2021-11-13 MED ORDER — SODIUM CHLORIDE 0.9 % IV BOLUS
1000.0000 mL | Freq: Once | INTRAVENOUS | Status: AC
  Administered 2021-11-13: 1000 mL via INTRAVENOUS

## 2021-11-13 MED ORDER — ONDANSETRON HCL 4 MG/2ML IJ SOLN
INTRAMUSCULAR | Status: AC
  Administered 2021-11-13: 4 mg via INTRAVENOUS
  Filled 2021-11-13: qty 2

## 2021-11-13 NOTE — ED Notes (Signed)
Pt to CT

## 2021-11-13 NOTE — ED Provider Notes (Signed)
? ?[facility] ?Provider Note ? ? ? Event Date/Time  ? First MD Initiated Contact with Patient 11/13/21 2256   ?  (approximate) ? ? ?History  ? ?Abdominal Pain ? ? ?HPI ? ?Maria Hodge is a 61 y.o. female who presents to the ED for evaluation of Abdominal Pain ?  ?Review outpatient behavioral health visit from December.  History of bipolar disorder, anxiety.  ? ?Patient presents to the ED for evaluation of 2 days of left-sided abdominal discomfort with associated nausea, vomiting and diarrhea.  She reports nonbloody nonbilious emesis and watery diarrhea, countless episodes.  Reports concerns for dehydration.  Reports decreased urinary output without dysuria.  Denies fevers or syncope. ? ?Physical Exam  ? ?Triage Vital Signs: ?ED Triage Vitals  ?Enc Vitals Group  ?   BP 11/13/21 2030 115/85  ?   Pulse Rate 11/13/21 2030 (!) 111  ?   Resp 11/13/21 2030 18  ?   Temp 11/13/21 2030 98.2 ?F (36.8 ?C)  ?   Temp Source 11/13/21 2030 Oral  ?   SpO2 11/13/21 2030 95 %  ?   Weight 11/13/21 2030 130 lb (59 kg)  ?   Height 11/13/21 2030 5\' 5"  (1.651 m)  ?   Head Circumference --   ?   Peak Flow --   ?   Pain Score 11/13/21 2029 7  ?   Pain Loc --   ?   Pain Edu? --   ?   Excl. in GC? --   ? ? ?Most recent vital signs: ?Vitals:  ? 11/14/21 0035 11/14/21 0135  ?BP: 117/73 135/83  ?Pulse: 93 (!) 103  ?Resp: 18 20  ?Temp:    ?SpO2: 98% 95%  ? ? ?General: Awake, no distress.  ?CV:  Good peripheral perfusion.  Tachycardic and regular ?Resp:  Normal effort.  ?Abd:  No distention.  Left-sided abdominal tenderness with some voluntary guarding making deeper palpation and examination difficult.  No peritoneal features. ?MSK:  No deformity noted.  ?Neuro:  No focal deficits appreciated. ?Other:   ? ? ?ED Results / Procedures / Treatments  ? ?Labs ?(all labs ordered are listed, but only abnormal results are displayed) ?Labs Reviewed  ?COMPREHENSIVE METABOLIC PANEL - Abnormal; Notable for the following components:  ?     Result Value  ? Chloride 97 (*)   ? Glucose, Bld 114 (*)   ? Total Protein 9.2 (*)   ? All other components within normal limits  ?CBC - Abnormal; Notable for the following components:  ? RBC 5.42 (*)   ? Hemoglobin 16.2 (*)   ? HCT 49.3 (*)   ? RDW 15.8 (*)   ? All other components within normal limits  ?URINALYSIS, ROUTINE W REFLEX MICROSCOPIC - Abnormal; Notable for the following components:  ? Color, Urine AMBER (*)   ? APPearance HAZY (*)   ? Hgb urine dipstick SMALL (*)   ? Bilirubin Urine SMALL (*)   ? Ketones, ur 5 (*)   ? Protein, ur 100 (*)   ? All other components within normal limits  ?RESP PANEL BY RT-PCR (FLU A&B, COVID) ARPGX2  ?LIPASE, BLOOD  ?HIV ANTIBODY (ROUTINE TESTING W REFLEX)  ? ? ?EKG ? ? ?RADIOLOGY ?CT abdomen/pelvis reviewed by me with dilated loops of bowel concerning for SBO, right-sided diaphragmatic hernia ? ?Official radiology report(s): ?CT ABDOMEN PELVIS W CONTRAST ? ?Result Date: 11/14/2021 ?CLINICAL DATA:  Left lower quadrant pain EXAM: CT ABDOMEN AND PELVIS WITH  CONTRAST TECHNIQUE: Multidetector CT imaging of the abdomen and pelvis was performed using the standard protocol following bolus administration of intravenous contrast. RADIATION DOSE REDUCTION: This exam was performed according to the departmental dose-optimization program which includes automated exposure control, adjustment of the mA and/or kV according to patient size and/or use of iterative reconstruction technique. CONTRAST:  OMNIPAQUE IOHEXOL 300 MG/ML  SOLN COMPARISON:  05/16/2019 FINDINGS: Lower chest: Linear scarring at the right lung base. Large anterior diaphragmatic hernia containing large and small bowel loops. Several of the small bowel loops are mildly dilated and fluid-filled. No effusions. Hepatobiliary: No focal hepatic abnormality. Gallbladder unremarkable. Pancreas: No focal abnormality or ductal dilatation. Spleen: No focal abnormality.  Normal size. Adrenals/Urinary Tract: Low-density nodule in  the left adrenal gland measures 13 mm, stable since prior study compatible with adenoma. Right adrenal gland and kidneys unremarkable. Right kidney malrotated. No hydronephrosis. Urinary bladder decompressed, grossly unremarkable. Stomach/Bowel: Dilated fluid-filled small bowel loops. Distal small bowel loops are decompressed. Exact transition point is difficult to visualize, but might be related to the large anterior diaphragmatic hernia. Several of the small bowel loops within the right lower chest/hernia are also dilated and fluid-filled. Vascular/Lymphatic: No evidence of aneurysm or adenopathy. Reproductive: Uterus and adnexa unremarkable.  No mass. Other: No free fluid or free air. Musculoskeletal: No acute bony abnormality. IMPRESSION: Findings compatible with distal small bowel obstruction. There is a large anterior diaphragmatic hernia which contains large bowel and multiple small bowel loops. Several of these small bowel loops are also dilated and fluid-filled within the hernia. It is difficult to detect the exact transition/cause, but may be related to the diaphragmatic hernia. Electronically Signed   By: Charlett Nose M.D.   On: 11/14/2021 00:21   ? ?PROCEDURES and INTERVENTIONS: ? ?.1-3 Lead EKG Interpretation ?Performed by: Delton Prairie, MD ?Authorized by: Delton Prairie, MD  ? ?  Interpretation: normal   ?  ECG rate:  88 ?  ECG rate assessment: normal   ?  Rhythm: sinus rhythm   ?  Ectopy: none   ?  Conduction: normal   ? ?Medications  ?acetaminophen (TYLENOL) tablet 650 mg (has no administration in time range)  ?  Or  ?acetaminophen (TYLENOL) suppository 650 mg (has no administration in time range)  ?ondansetron (ZOFRAN) tablet 4 mg (has no administration in time range)  ?  Or  ?ondansetron (ZOFRAN) injection 4 mg (has no administration in time range)  ?lactated ringers infusion ( Intravenous New Bag/Given 11/14/21 0254)  ?morphine (PF) 2 MG/ML injection 2 mg (has no administration in time range)   ?sodium chloride 0.9 % bolus 1,000 mL (0 mLs Intravenous Stopped 11/13/21 2350)  ?ondansetron [facility]) injection 4 mg (4 mg Intravenous Given 11/13/21 2250)  ?iohexol (OMNIPAQUE) 300 MG/ML solution 100 mL (100 mLs Intravenous Contrast Given 11/13/21 2353)  ?morphine (PF) 4 MG/ML injection 4 mg (4 mg Intravenous Given 11/14/21 0138)  ? ? ? ?IMPRESSION / MDM / ASSESSMENT AND PLAN / ED COURSE  ?I reviewed the triage vital signs and the nursing notes. ? ?61 year old female presents to the ED with left-sided abdominal discomfort, emesis and diarrhea with evidence of a small bowel obstruction and requiring medical admission.  She looks systemically well, but has some significant tenderness to left-sided abdominal palpation.  No peritoneal features.  Blood work is benign without leukocytosis, signs of pancreatitis or metabolic derangements.  Urine with ketonuria suggestive of dehydration but without infectious features.  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  Her symptoms are well controlled and no indications for NG tube during my time with her.  Consulted with medicine who agrees to admit. ? ?Clinical Course as of 11/14/21 0305  ?Fri Nov 14, 2021  ?0135 Updated patient and husband on CT results.  I recommended medical admission and they are agreeable. [DS]  ?  ?Clinical Course User Index ?[DS] Delton Prairie, MD  ? ? ? ?FINAL CLINICAL IMPRESSION(S) / ED DIAGNOSES  ? ?Final diagnoses:  ?Morgagni hernia  ?SBO (small bowel obstruction) (HCC)  ? ? ? ?Rx / DC Orders  ? ?ED Discharge Orders   ? ? None  ? ?  ? ? ? ?Note:  This document was prepared using Dragon voice recognition software and may include unintentional dictation errors. ?  ?Delton Prairie, MD ?11/14/21 [id number] ? ?

## 2021-11-13 NOTE — ED Triage Notes (Signed)
Pt presents via POV with complaints of LLQ pain starting tonight with associated N/V. She has tried OTC medications without improvement. Denies CP or SOB. ?

## 2021-11-14 ENCOUNTER — Encounter: Payer: Self-pay | Admitting: Internal Medicine

## 2021-11-14 ENCOUNTER — Inpatient Hospital Stay: Payer: Medicare HMO

## 2021-11-14 DIAGNOSIS — K449 Diaphragmatic hernia without obstruction or gangrene: Secondary | ICD-10-CM

## 2021-11-14 DIAGNOSIS — I10 Essential (primary) hypertension: Secondary | ICD-10-CM

## 2021-11-14 DIAGNOSIS — K219 Gastro-esophageal reflux disease without esophagitis: Secondary | ICD-10-CM | POA: Diagnosis present

## 2021-11-14 DIAGNOSIS — E876 Hypokalemia: Secondary | ICD-10-CM | POA: Diagnosis present

## 2021-11-14 DIAGNOSIS — Z8249 Family history of ischemic heart disease and other diseases of the circulatory system: Secondary | ICD-10-CM | POA: Diagnosis not present

## 2021-11-14 DIAGNOSIS — K56609 Unspecified intestinal obstruction, unspecified as to partial versus complete obstruction: Secondary | ICD-10-CM | POA: Diagnosis present

## 2021-11-14 DIAGNOSIS — Z886 Allergy status to analgesic agent status: Secondary | ICD-10-CM | POA: Diagnosis not present

## 2021-11-14 DIAGNOSIS — Z79899 Other long term (current) drug therapy: Secondary | ICD-10-CM | POA: Diagnosis not present

## 2021-11-14 DIAGNOSIS — Z880 Allergy status to penicillin: Secondary | ICD-10-CM | POA: Diagnosis not present

## 2021-11-14 DIAGNOSIS — F319 Bipolar disorder, unspecified: Secondary | ICD-10-CM

## 2021-11-14 DIAGNOSIS — E440 Moderate protein-calorie malnutrition: Secondary | ICD-10-CM | POA: Diagnosis present

## 2021-11-14 DIAGNOSIS — F17210 Nicotine dependence, cigarettes, uncomplicated: Secondary | ICD-10-CM | POA: Diagnosis present

## 2021-11-14 DIAGNOSIS — Z818 Family history of other mental and behavioral disorders: Secondary | ICD-10-CM | POA: Diagnosis not present

## 2021-11-14 DIAGNOSIS — F419 Anxiety disorder, unspecified: Secondary | ICD-10-CM | POA: Diagnosis present

## 2021-11-14 DIAGNOSIS — Q790 Congenital diaphragmatic hernia: Principal | ICD-10-CM

## 2021-11-14 DIAGNOSIS — Z20822 Contact with and (suspected) exposure to covid-19: Secondary | ICD-10-CM | POA: Diagnosis present

## 2021-11-14 DIAGNOSIS — Z6821 Body mass index (BMI) 21.0-21.9, adult: Secondary | ICD-10-CM | POA: Diagnosis not present

## 2021-11-14 DIAGNOSIS — Z23 Encounter for immunization: Secondary | ICD-10-CM | POA: Diagnosis not present

## 2021-11-14 LAB — HIV ANTIBODY (ROUTINE TESTING W REFLEX): HIV Screen 4th Generation wRfx: NONREACTIVE

## 2021-11-14 LAB — RESP PANEL BY RT-PCR (FLU A&B, COVID) ARPGX2
Influenza A by PCR: NEGATIVE
Influenza B by PCR: NEGATIVE
SARS Coronavirus 2 by RT PCR: NEGATIVE

## 2021-11-14 MED ORDER — ACETAMINOPHEN 650 MG RE SUPP: 650.0000 mg | Freq: Four times a day (QID) | RECTAL | Status: DC | PRN

## 2021-11-14 MED ORDER — BUPROPION HCL ER (XL) 150 MG PO TB24
150.0000 mg | ORAL_TABLET | Freq: Every day | ORAL | Status: DC
  Administered 2021-11-14 – 2021-11-16 (×3): 150 mg via ORAL
  Filled 2021-11-14 (×3): qty 1

## 2021-11-14 MED ORDER — MENTHOL 3 MG MT LOZG
1.0000 | LOZENGE | OROMUCOSAL | Status: DC | PRN
  Filled 2021-11-14 (×2): qty 9

## 2021-11-14 MED ORDER — PNEUMOCOCCAL 20-VAL CONJ VACC 0.5 ML IM SUSY
0.5000 mL | PREFILLED_SYRINGE | INTRAMUSCULAR | Status: AC
  Administered 2021-11-15: 0.5 mL via INTRAMUSCULAR
  Filled 2021-11-14 (×2): qty 0.5

## 2021-11-14 MED ORDER — AMITRIPTYLINE HCL 75 MG PO TABS
150.0000 mg | ORAL_TABLET | Freq: Every day | ORAL | Status: DC
  Administered 2021-11-14 – 2021-11-15 (×2): 150 mg via ORAL
  Filled 2021-11-14 (×2): qty 2

## 2021-11-14 MED ORDER — LACTATED RINGERS IV SOLN: INTRAVENOUS | Status: DC

## 2021-11-14 MED ORDER — KCL IN DEXTROSE-NACL 20-5-0.9 MEQ/L-%-% IV SOLN
INTRAVENOUS | Status: DC
  Filled 2021-11-14 (×4): qty 1000

## 2021-11-14 MED ORDER — ONDANSETRON HCL 4 MG PO TABS: 4.0000 mg | ORAL_TABLET | Freq: Four times a day (QID) | ORAL | Status: DC | PRN

## 2021-11-14 MED ORDER — ONDANSETRON HCL 4 MG/2ML IJ SOLN
4.0000 mg | Freq: Four times a day (QID) | INTRAMUSCULAR | Status: DC | PRN
  Administered 2021-11-15: 4 mg via INTRAVENOUS
  Filled 2021-11-14: qty 2

## 2021-11-14 MED ORDER — CLONAZEPAM 0.5 MG PO TABS
0.5000 mg | ORAL_TABLET | Freq: Two times a day (BID) | ORAL | Status: DC
  Administered 2021-11-14 – 2021-11-16 (×5): 0.5 mg via ORAL
  Filled 2021-11-14 (×5): qty 1

## 2021-11-14 MED ORDER — ACETAMINOPHEN 325 MG PO TABS: 650.0000 mg | ORAL_TABLET | Freq: Four times a day (QID) | ORAL | Status: DC | PRN

## 2021-11-14 MED ORDER — HEPARIN SODIUM (PORCINE) 5000 UNIT/ML IJ SOLN
5000.0000 [IU] | Freq: Three times a day (TID) | INTRAMUSCULAR | Status: DC
  Administered 2021-11-14 – 2021-11-16 (×7): 5000 [IU] via SUBCUTANEOUS
  Filled 2021-11-14 (×7): qty 1

## 2021-11-14 MED ORDER — PHENOL 1.4 % MT LIQD
1.0000 | OROMUCOSAL | Status: DC | PRN
  Filled 2021-11-14 (×3): qty 177

## 2021-11-14 MED ORDER — QUETIAPINE FUMARATE 200 MG PO TABS
400.0000 mg | ORAL_TABLET | Freq: Two times a day (BID) | ORAL | Status: DC
Start: 2021-11-14 — End: 2021-11-16
  Administered 2021-11-14 – 2021-11-16 (×5): 400 mg via ORAL
  Filled 2021-11-14 (×5): qty 2

## 2021-11-14 MED ORDER — MORPHINE SULFATE (PF) 4 MG/ML IV SOLN
4.0000 mg | Freq: Once | INTRAVENOUS | Status: AC
  Administered 2021-11-14: 4 mg via INTRAVENOUS
  Filled 2021-11-14: qty 1

## 2021-11-14 MED ORDER — MORPHINE SULFATE (PF) 2 MG/ML IV SOLN
2.0000 mg | INTRAVENOUS | Status: DC | PRN
  Administered 2021-11-14 – 2021-11-16 (×11): 2 mg via INTRAVENOUS
  Filled 2021-11-14 (×11): qty 1

## 2021-11-14 NOTE — Progress Notes (Signed)
Initial Nutrition Assessment ? ?DOCUMENTATION CODES:  ? ?Non-severe (moderate) malnutrition in context of chronic illness ? ?INTERVENTION:  ?- Recommend TPN if unable to advance diet ? ?- Once diet advances, will add nutrition supplements to enhance nutritional adequacy ? ?- Provided pt with coupons to purchase Ensure ? ?NUTRITION DIAGNOSIS:  ? ?Moderate Malnutrition related to chronic illness (diaphragmatic hernia) as evidenced by moderate fat depletion, moderate muscle depletion. ? ?GOAL:  ? ?Patient will meet greater than or equal to 90% of their needs ? ?MONITOR:  ? ?Diet advancement, Labs, Weight trends, I & O's ? ?REASON FOR ASSESSMENT:  ? ?Malnutrition Screening Tool ?  ? ?ASSESSMENT:  ? ?Pt admitted with abdominal pain associated with nausea, vomiting and diarrhea secondary to SBO. PMH significant for bipolar mood disorder, HTN and large diaphragmatic hernia. ? ?3/17: NG tube placed to suction ? ?Per Surgery, anticipate resolution of SBO with bowel rest, NG decompression and fluid resuscitation ? ?Pt reports no changes to appetite or PO intake aside from now being NPO. She has always been a light eater. Dietary recall includes 2 meals per day consisting of soup, applesauce, puddings, beans, no meats. She has had ongoing, intermittent nausea/vomiting for several months. Given her diaphragmatic hernia, she reports early satiety. She has previously tried Ensure products and enjoys all flavors, however she does not buy them often d/t the high cost. Provided pt with coupons to purchase for home use after d/c. ? ?Pt reports a usual weight of 160 lbs. Within the last 2 months, she endorses a 30 lbs weight loss which she is unsure why. Reviewed weight history. Limited documentation of recent weights. Unable to confirm weight loss within this time frame. Will continue to monitor throughout admission. ? ?Medications: D5 and NaCl with KCl @ 111ml/hr ? ?Reviewed labs. ? ?NUTRITION - FOCUSED PHYSICAL EXAM: ? ?Flowsheet  Row Most Recent Value  ?Orbital Region Moderate depletion  ?Upper Arm Region Moderate depletion  ?Thoracic and Lumbar Region Mild depletion  ?Buccal Region Mild depletion  ?Temple Region Moderate depletion  ?Clavicle Bone Region Moderate depletion  ?Clavicle and Acromion Bone Region Severe depletion  ?Scapular Bone Region Mild depletion  ?Dorsal Hand Mild depletion  ?Patellar Region Severe depletion  ?Anterior Thigh Region Severe depletion  ?Posterior Calf Region Moderate depletion  ?Edema (RD Assessment) None  ?Hair Reviewed  ?Eyes Reviewed  ?Mouth Other (Comment)  [tongue w/fissure appearance]  ?Skin Reviewed  ?Nails Reviewed  ? ?  ? ? ?Diet Order:   ?Diet Order   ? ?       ?  Diet NPO time specified Except for: Sips with Meds  Diet effective now       ?  ? ?  ?  ? ?  ? ? ?EDUCATION NEEDS:  ? ?Education needs have been addressed ? ?Skin:  Skin Assessment: Reviewed RN Assessment ? ?Last BM:  3/14 ? ?Height:  ? ?Ht Readings from Last 1 Encounters:  ?11/13/21 5\' 5"  (1.651 m)  ? ? ?Weight:  ? ?Wt Readings from Last 1 Encounters:  ?11/13/21 59 kg  ? ?BMI:  Body mass index is 21.63 kg/m?. ? ?Estimated Nutritional Needs:  ? ?Kcal:  1700-1900 ? ?Protein:  85-100g ? ?Fluid:  >/=1.7L ? ?11/15/21, RDN, LDN ?Clinical Nutrition ?

## 2021-11-14 NOTE — ED Notes (Signed)
Care hand off provided to the floor RN - RN notified that the patient is currently in the hallway and NG placement is not possible at this time.  ?

## 2021-11-14 NOTE — Assessment & Plan Note (Addendum)
Hold quetiapine for now given n.p.o. status ?

## 2021-11-14 NOTE — Assessment & Plan Note (Signed)
Hold meds pending medication verification, given n.p.o. status ?

## 2021-11-14 NOTE — Assessment & Plan Note (Signed)
Hydralazine IV, as needed while n.p.o. ?

## 2021-11-14 NOTE — Discharge Instructions (Signed)
Nutrition Post Hospital Stay Proper nutrition can help your body recover from illness and injury.   Foods and beverages high in protein, vitamins, and minerals help rebuild muscle loss, promote healing, & reduce fall risk.   .In addition to eating healthy foods, a nutrition shake is an easy, delicious way to get the nutrition you need during and after your hospital stay  It is recommended that you continue to drink 2 bottles per day of:       Ensure for at least 1 month (30 days) after your hospital stay   Tips for adding a nutrition shake into your routine: As allowed, drink one with vitamins or medications instead of water or juice Enjoy one as a tasty mid-morning or afternoon snack Drink cold or make a milkshake out of it Drink one instead of milk with cereal or snacks Use as a coffee creamer   Available at the following grocery stores and pharmacies:           * [employer] * [employer] * [employer]  * [employer]          * [employer] * [employer]  * [facility]      * Target  * [employer]   * [facility]  * [employer]   * [facility] [phone]            For COUPONS visit: www.[email or web address]/join or [email or web address]/[email or web address]   Suggested Substitutions Ensure Plus = Boost Plus = Carnation Breakfast Essentials = Boost Compact Ensure Active Clear = Boost Breeze Glucerna Shake = Boost Glucose Control = Carnation Breakfast Essentials SUGAR FREE       

## 2021-11-14 NOTE — Consult Note (Addendum)
[facility] SURGICAL CONSULTATION NOTE (initial) - cpt: 99254   HISTORY OF PRESENT ILLNESS (HPI):  61 y.o. female presented to [facility] ED today for evaluation of abdominal pain. Patient reports known right-sided anterior diaphragmatic hernia.  Now with 2-day history of abdominal pain seemingly most in the left lower quadrant.  Associated nausea, vomiting and loose stools.  She presented well aware that she was getting dehydrated. She denies any prior abdominal surgeries.  Surgery is consulted by hospitalist physician Dr. Para March in this context for evaluation and management of small bowel obstruction.  PAST MEDICAL HISTORY (PMH):  Past Medical History:  Diagnosis Date   Anxiety    Arrhythmia    Arthritis    hands   Depression    GERD (gastroesophageal reflux disease)    Heart murmur    Hypertension    Wears dentures    partial upper     PAST SURGICAL HISTORY (PSH):  Past Surgical History:  Procedure Laterality Date   BACK SURGERY  2010   ORIF ANKLE FRACTURE Left 11/09/2017   Procedure: OPEN REDUCTION INTERNAL FIXATION (ORIF) ANKLE FRACTURE WITH POSSIBLE SYNDESMOSIS REPAIR;  Surgeon: Signa Kell, MD;  Location: [facility];  Service: Orthopedics;  Laterality: Left;  GENERAL WITH NERVE BLOCK ALEX SYNDEAMOSIS TIGHTROPE MARK ANKLE FRACTURE PLATES  C ARM  PLASTER SPLINT   STERIOD INJECTION  12/21/2011   Procedure: MINOR STEROID INJECTION;  Surgeon: Mat Carne, MD;  Location: [facility];  Service: Orthopedics;  Laterality: Right;  Right L3-4 transforaminal epidural steroid injection, attempted right L4-5 epidural steroid injection( 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)     MEDICATIONS:  Prior to Admission medications   Medication Sig Start Date End Date Taking? Authorizing Provider  acetaminophen (TYLENOL) 500 MG tablet Take 500 mg by mouth every 6 (six) hours as needed.   Yes [provider]  amitriptyline (ELAVIL) 150 MG  tablet TAKE 1 TABLET BY MOUTH EVERYDAY AT BEDTIME 08/12/21  Yes Clapacs, Jackquline Denmark, MD  buPROPion (WELLBUTRIN XL) 150 MG 24 hr tablet Take 1 tablet (150 mg total) by mouth daily. 08/12/21  Yes Clapacs, Jackquline Denmark, MD  clonazePAM (KLONOPIN) 0.5 MG tablet Take 1 tablet (0.5 mg total) by mouth 2 (two) times daily. 08/08/21  Yes Clapacs, Jackquline Denmark, MD  clonazePAM (KLONOPIN) 0.5 MG tablet Take 1 tablet (0.5 mg total) by mouth 2 (two) times daily. 08/12/21 08/12/22 Yes Clapacs, Jackquline Denmark, MD  esomeprazole (NEXIUM) 20 MG capsule Take 20 mg by mouth daily.   Yes [provider]  QUEtiapine (SEROQUEL) 400 MG tablet TAKE 1 TABLET BY MOUTH TWICE A DAY 07/25/20  Yes Clapacs, [name], MD  QUEtiapine (SEROQUEL) 400 MG tablet TAKE 1 TABLET BY MOUTH TWICE A DAY 03/24/21  Yes Clapacs, Jackquline Denmark, MD  QUEtiapine (SEROQUEL) 400 MG tablet Take 1 tablet (400 mg total) by mouth 2 (two) times daily. 08/12/21  Yes Clapacs, Jackquline Denmark, MD  ondansetron (ZOFRAN ODT) 4 MG disintegrating tablet Take 1 tablet (4 mg total) by mouth every 8 (eight) hours as needed for nausea or vomiting. Patient not taking: Reported on 11/14/2021 12/30/20   Merwyn Katos, MD  polyethylene glycol [facility]) packet Take 17 g by mouth daily. Patient not taking: Reported on 11/14/2021 06/07/18   Jeanmarie Plant, MD  QUEtiapine (SEROQUEL) 400 MG tablet Take 1 tablet (400 mg total) by mouth 2 (two) times daily. Patient not taking: Reported on 11/14/2021 07/30/20   Clapacs, Jackquline Denmark, MD  traMADol (  ULTRAM) 50 MG tablet Take 1 tablet (50 mg total) by mouth 2 (two) times daily. Patient not taking: Reported on 11/14/2021 03/01/18   Menshew, Charlesetta Ivory, PA-C     ALLERGIES:  Allergies  Allergen Reactions   Penicillins Anaphylaxis and Swelling   Aspirin Other (See Comments)    Gastritis     SOCIAL HISTORY:  Social History   Socioeconomic History   Marital status: Married    Spouse name: Not on file   Number of children: Not on file   Years of education: Not on file    Highest education level: Not on file  Occupational History   Not on file  Tobacco Use   Smoking status: Every Day    Packs/day: 0.50    Types: Cigarettes    Start date: 06/05/1992   Smokeless tobacco: Never   Tobacco comments:    down to 1/3 PPD  Vaping Use   Vaping Use: Never used  Substance and Sexual Activity   Alcohol use: No    Alcohol/week: 0.0 standard drinks   Drug use: No   Sexual activity: Yes    Birth control/protection: None  Other Topics Concern   Not on file  Social History Narrative   Not on file   Social Determinants of Health   Financial Resource Strain: Not on file  Food Insecurity: Not on file  Transportation Needs: Not on file  Physical Activity: Not on file  Stress: Not on file  Social Connections: Not on file  Intimate Partner Violence: Not on file     FAMILY HISTORY:  Family History  Problem Relation Age of Onset   Heart failure Mother    Hypertension Mother    Depression Mother    Anxiety disorder Mother    Cancer Father    Rheum arthritis Father    Heart failure Sister    Rheum arthritis Sister    Heart failure Sister    Cancer Sister       REVIEW OF SYSTEMS:  Review of Systems  All other systems reviewed and are negative.  VITAL SIGNS:  Temp:  [98.2 F (36.8 C)-99.1 F (37.3 C)] 99.1 F (37.3 C) (03/17 0744) Pulse Rate:  [93-111] 102 (03/17 0744) Resp:  [16-20] 16 (03/17 0744) BP: (106-140)/(72-87) 106/77 (03/17 0744) SpO2:  [94 %-98 %] 95 % (03/17 0744) Weight:  [59 kg] 59 kg (03/16 2030)     Height: 5\' 5"  (165.1 cm) Weight: 59 kg BMI (Calculated): 21.63   INTAKE/OUTPUT:  03/16 0701 - 03/17 0700 In: 999 [IV Piggyback:999] Out: 0   PHYSICAL EXAM:  Physical Exam Blood pressure 106/77, pulse (!) 102, temperature 99.1 F (37.3 C), resp. rate 16, height 5\' 5"  (1.651 m), weight 59 kg, SpO2 95 %. Last Weight  Most recent update: 11/13/2021  8:30 PM    Weight  59 kg (130 lb)             CONSTITUTIONAL: Well  developed, and nourished, appropriately responsive and aware without distress.  Somewhat depressed, teary-eyed. EYES: Sclera non-icteric.   EARS, NOSE, MOUTH AND THROAT:  The oropharynx is clear. Oral mucosa is pink and moist.   Hearing is intact to voice.  NECK: Trachea is midline, and there is no jugular venous distension.  LYMPH NODES:  Lymph nodes in the neck are not enlarged. RESPIRATORY:  Lungs are clear, and breath sounds are diminished at the bases on the right. Normal respiratory effort without pathologic use of accessory muscles. CARDIOVASCULAR: Heart is  regular in rate and rhythm. GI: The abdomen is  soft, nontender, and nondistended. There were no palpable masses.  There were hypoactive bowel sounds. MUSCULOSKELETAL:  Symmetrical muscle tone.  SKIN: Skin turgor is normal. No pathologic skin lesions appreciated.  NEUROLOGIC:  Motor and sensation appear grossly normal.  Cranial nerves are grossly without defect. PSYCH:  Alert and oriented to person, place and time. Affect is appropriate for situation.  Data Reviewed I have personally reviewed what is currently available of the patient's imaging, recent labs and medical records.    Labs:  CBC Latest Ref Rng & Units 11/13/2021 12/30/2020 05/16/2019  WBC 4.0 - 10.5 K/uL 7.3 13.0(H) 9.3  Hemoglobin 12.0 - 15.0 g/dL 16.2(H) 17.2(H) 15.5(H)  Hematocrit 36.0 - 46.0 % 49.3(H) 50.1(H) 47.5(H)  Platelets 150 - 400 K/uL 206 205 220   CMP Latest Ref Rng & Units 11/13/2021 12/30/2020 05/16/2019  Glucose 70 - 99 mg/dL 161(W) 95 95  BUN 6 - 20 mg/dL 10 11 11   Creatinine 0.44 - 1.00 mg/dL 9.60 4.54 0.98  Sodium 135 - 145 mmol/L 139 137 136  Potassium 3.5 - 5.1 mmol/L 3.7 3.6 3.9  Chloride 98 - 111 mmol/L 97(L) 98 99  CO2 22 - 32 mmol/L 28 20(L) 26  Calcium 8.9 - 10.3 mg/dL 9.7 9.8 9.3  Total Protein 6.5 - 8.1 g/dL 9.2(H) 8.5(H) 8.4(H)  Total Bilirubin 0.3 - 1.2 mg/dL 0.4 1.1(B) 0.5  Alkaline Phos 38 - 126 U/L 88 79 83  AST 15 - 41 U/L 23 16 16    ALT 0 - 44 U/L 20 12 10      Imaging studies:  Radiology review:  Last 24 hrs: DG Abd 1 View  Result Date: 11/14/2021 CLINICAL DATA:  NG tube placement. EXAM: ABDOMEN - 1 VIEW COMPARISON:  KUB, 06/07/2018.  CT AP, 11/13/2017. FINDINGS: Enteric feeding tube with tip within stomach, however the side port appears positioned at the GE junction. Diaphragmatic hernia with bowel interposition RIGHT upper quadrant. IMPRESSION: 1. Enteric feeding tube with tip within stomach, however the side port appears positioned at the GE junction. Consider advancement by 5 cm for appropriate placement. 2. Diaphragmatic hernia with bowel interposition RIGHT upper quadrant. Electronically Signed   By: Roanna Banning M.D.   On: 11/14/2021 08:22   CT ABDOMEN PELVIS W CONTRAST  Result Date: 11/14/2021 CLINICAL DATA:  Left lower quadrant pain EXAM: CT ABDOMEN AND PELVIS WITH CONTRAST TECHNIQUE: Multidetector CT imaging of the abdomen and pelvis was performed using the standard protocol following bolus administration of intravenous contrast. RADIATION DOSE REDUCTION: This exam was performed according to the departmental dose-optimization program which includes automated exposure control, adjustment of the mA and/or kV according to patient size and/or use of iterative reconstruction technique. CONTRAST:  OMNIPAQUE IOHEXOL 300 MG/ML  SOLN COMPARISON:  05/16/2019 FINDINGS: Lower chest: Linear scarring at the right lung base. Large anterior diaphragmatic hernia containing large and small bowel loops. Several of the small bowel loops are mildly dilated and fluid-filled. No effusions. Hepatobiliary: No focal hepatic abnormality. Gallbladder unremarkable. Pancreas: No focal abnormality or ductal dilatation. Spleen: No focal abnormality.  Normal size. Adrenals/Urinary Tract: Low-density nodule in the left adrenal gland measures 13 mm, stable since prior study compatible with adenoma. Right adrenal gland and kidneys unremarkable. Right  kidney malrotated. No hydronephrosis. Urinary bladder decompressed, grossly unremarkable. Stomach/Bowel: Dilated fluid-filled small bowel loops. Distal small bowel loops are decompressed. Exact transition point is difficult to visualize, but might be related to the large anterior diaphragmatic  hernia. Several of the small bowel loops within the right lower chest/hernia are also dilated and fluid-filled. Vascular/Lymphatic: No evidence of aneurysm or adenopathy. Reproductive: Uterus and adnexa unremarkable.  No mass. Other: No free fluid or free air. Musculoskeletal: No acute bony abnormality. IMPRESSION: Findings compatible with distal small bowel obstruction. There is a large anterior diaphragmatic hernia which contains large bowel and multiple small bowel loops. Several of these small bowel loops are also dilated and fluid-filled within the hernia. It is difficult to detect the exact transition/cause, but may be related to the diaphragmatic hernia. Electronically Signed   By: Charlett Nose M.D.   On: 11/14/2021 00:21     Assessment/Plan:  61 y.o. female with CT evidence of small bowel obstruction, pain improved by nasogastric decompression, complicated by pertinent comorbidities including:  Patient Active Problem List   Diagnosis Date Noted   Small bowel obstruction (HCC) 11/14/2021   Hypertension    Diaphragmatic hernia    Bipolar mood disorder (HCC)     -I am anticipating resolution of her small bowel obstruction with bowel rest, nasogastric decompression and fluid resuscitation.  -We discussed definitive diaphragmatic hernia repair in this setting, and feel this is best deferred for now if feasible.  -PPI prophylaxis  - DVT prophylaxis  -We will follow along with you.  -Eventual referral to thoracic surgeon for definitive repair of hernia.  All of the above findings and recommendations were discussed with the patient, and all of patient's questions were answered to their expressed  satisfaction.  Thank you for the opportunity to participate in this patient's care.   -- Campbell Lerner, M.D., FACS 11/14/2021, 8:37 AM

## 2021-11-14 NOTE — Assessment & Plan Note (Addendum)
N.p.o. ?IV hydration, IV antiemetics, pain control ?Surgical consult: Discussed with Dr. Claudine Mouton via secure chat.  He advises to put NG tube.  Will see patient ?

## 2021-11-14 NOTE — Progress Notes (Addendum)
Progress Note    Maria Hodge  ONG:[id number] DOB: 1960/12/30  DOA: 11/13/2021 PCP: Patient, No Pcp Per (Inactive)      Brief Narrative:    Medical records reviewed and are as summarized below:  Maria Hodge is a 61 y.o. female with medical history significant for bipolar disorder, hypertension, large diaphragmatic hernia, who presented to the hospital with left-sided abdominal pain, nausea, vomiting and diarrhea.  She was found to have small bowel obstruction.  NG tube was placed for gastric decompression.  She was treated with analgesics, antiemetics and IV fluids.    Assessment/Plan:   Principal Problem:   Small bowel obstruction (HCC) Active Problems:   Hypertension   Diaphragmatic hernia   Bipolar mood disorder (HCC)   Malnutrition of moderate degree   Nutrition Problem: Moderate Malnutrition Etiology: chronic illness (diaphragmatic hernia)  Signs/Symptoms: moderate fat depletion, moderate muscle depletion   Body mass index is 21.63 kg/m.   Small bowel obstruction with underlying diaphragmatic hernia: NG tube connected to intermittent low wall suction.  She is NPO.  Continue IV fluids, analgesics and antiemetics as needed.  Monitor electrolytes.  Follow-up with general surgeon.  Referral to a thoracic surgeon in the future for evaluation and repair of diaphragmatic hernia.  Bipolar disorder, anxiety: Continue Seroquel and clonazepam.  She has been on clonazepam for about 9 years.  Hypertension: BP is well controlled at this point.    Diet Order             Diet NPO time specified Except for: Sips with Meds  Diet effective now                         Consultants: General surgeon  Procedures: None    Medications:    amitriptyline  150 mg Oral QHS   buPROPion  150 mg Oral Daily   clonazePAM  0.5 mg Oral BID   heparin injection (subcutaneous)  5,000 Units Subcutaneous Q8H   [START ON 11/15/2021] pneumococcal 20-valent conjugate  vaccine  0.5 mL Intramuscular Tomorrow-1000   QUEtiapine  400 mg Oral BID   Continuous Infusions:  dextrose 5 % and 0.9 % NaCl with KCl 20 mEq/L 100 mL/hr at 11/14/21 0900     Anti-infectives (From admission, onward)    None              Family Communication/Anticipated D/C date and plan/Code Status   DVT prophylaxis: heparin injection 5,000 Units Start: 11/14/21 0645     Code Status: Full Code  Family Communication: None Disposition Plan: Plan to discharge home in 2 to 3 days   Status is: Inpatient Remains inpatient appropriate because: Small bowel obstruction       Subjective:   Interval events noted.  She complains of abdominal pain and sore throat from NG tube.  No nausea or vomiting.  Objective:    Vitals:   11/14/21 0135 11/14/21 0317 11/14/21 0516 11/14/21 0744  BP: 135/83 140/72 122/87 106/77  Pulse: (!) 103 (!) 106 (!) 106 (!) 102  Resp: 20 18 20 16   Temp:   98.9 F (37.2 C) 99.1 F (37.3 C)  TempSrc:      SpO2: 95% 96% 94% 95%  Weight:      Height:       No data found.   Intake/Output Summary (Last 24 hours) at 11/14/2021 1147 Last data filed at 11/14/2021 0859 Gross per 24 hour  Intake 1759.41 ml  Output  0 ml  Net 1759.41 ml   Filed Weights   11/13/21 2030  Weight: 59 kg    Exam:  GEN: NAD SKIN: No rash EYES: EOMI ENT: MMM, NG tube connected to intermittent low wall suction CV: RRR PULM: CTA B ABD: soft, ND, mild left-sided tenderness, no rebound tenderness or guarding, +BS CNS: AAO x 3, non focal EXT: No edema or tenderness        Data Reviewed:   I have personally reviewed following labs and imaging studies:  Labs: Labs show the following:   Basic Metabolic Panel: Recent Labs  Lab 11/13/21 2031  NA 139  K 3.7  CL 97*  CO2 28  GLUCOSE 114*  BUN 10  CREATININE 0.84  CALCIUM 9.7   GFR Estimated Creatinine Clearance: 64.1 mL/min (by C-G formula based on SCr of 0.84 mg/dL). Liver Function  Tests: Recent Labs  Lab 11/13/21 2031  AST 23  ALT 20  ALKPHOS 88  BILITOT 0.4  PROT 9.2*  ALBUMIN 4.2   Recent Labs  Lab 11/13/21 2031  LIPASE 31   No results for input(s): AMMONIA in the last 168 hours. Coagulation profile No results for input(s): INR, PROTIME in the last 168 hours.  CBC: Recent Labs  Lab 11/13/21 2031  WBC 7.3  HGB 16.2*  HCT 49.3*  MCV 91.0  PLT 206   Cardiac Enzymes: No results for input(s): CKTOTAL, CKMB, CKMBINDEX, TROPONINI in the last 168 hours. BNP (last 3 results) No results for input(s): PROBNP in the last 8760 hours. CBG: No results for input(s): GLUCAP in the last 168 hours. D-Dimer: No results for input(s): DDIMER in the last 72 hours. Hgb A1c: No results for input(s): HGBA1C in the last 72 hours. Lipid Profile: No results for input(s): CHOL, HDL, LDLCALC, TRIG, CHOLHDL, LDLDIRECT in the last 72 hours. Thyroid function studies: No results for input(s): TSH, T4TOTAL, T3FREE, THYROIDAB in the last 72 hours.  Invalid input(s): FREET3 Anemia work up: No results for input(s): VITAMINB12, FOLATE, FERRITIN, TIBC, IRON, RETICCTPCT in the last 72 hours. Sepsis Labs: Recent Labs  Lab 11/13/21 2031  WBC 7.3    Microbiology Recent Results (from the past 240 hour(s))  Resp Panel by RT-PCR (Flu A&B, Covid) Nasopharyngeal Swab     Status: None   Collection Time: 11/14/21  2:59 AM   Specimen: Nasopharyngeal Swab; Nasopharyngeal(NP) swabs in vial transport medium  Result Value Ref Range Status   SARS Coronavirus 2 by RT PCR NEGATIVE NEGATIVE Final    Comment: (NOTE) SARS-CoV-2 target nucleic acids are NOT DETECTED.  The SARS-CoV-2 RNA is generally detectable in upper respiratory specimens during the acute phase of infection. The lowest concentration of SARS-CoV-2 viral copies this assay can detect is 138 copies/mL. A negative result does not preclude SARS-Cov-2 infection and should not be used as the sole basis for treatment  or other patient management decisions. A negative result may occur with  improper specimen collection/handling, submission of specimen other than nasopharyngeal swab, presence of viral mutation(s) within the areas targeted by this assay, and inadequate number of viral copies(<138 copies/mL). A negative result must be combined with clinical observations, patient history, and epidemiological information. The expected result is Negative.  Fact Sheet for Patients:  BloggerCourse.com  Fact Sheet for Healthcare Providers:  SeriousBroker.it  This test is no t yet approved or cleared by the [place] FDA and  has been authorized for detection and/or diagnosis of SARS-CoV-2 by FDA under an Emergency Use Authorization (EUA). This EUA  will remain  in effect (meaning this test can be used) for the duration of the COVID-19 declaration under Section 564(b)(1) of the Act, 21 U.S.C.section 360bbb-3(b)(1), unless the authorization is terminated  or revoked sooner.       Influenza A by PCR NEGATIVE NEGATIVE Final   Influenza B by PCR NEGATIVE NEGATIVE Final    Comment: (NOTE) The Xpert Xpress SARS-CoV-2/FLU/RSV plus assay is intended as an aid in the diagnosis of influenza from Nasopharyngeal swab specimens and should not be used as a sole basis for treatment. Nasal washings and aspirates are unacceptable for Xpert Xpress SARS-CoV-2/FLU/RSV testing.  Fact Sheet for Patients: BloggerCourse.com  Fact Sheet for Healthcare Providers: SeriousBroker.it  This test is not yet approved or cleared by the [place] FDA and has been authorized for detection and/or diagnosis of SARS-CoV-2 by FDA under an Emergency Use Authorization (EUA). This EUA will remain in effect (meaning this test can be used) for the duration of the COVID-19 declaration under Section 564(b)(1) of the Act, 21 U.S.C. section  360bbb-3(b)(1), unless the authorization is terminated or revoked.  Performed at [facility], [address] [place], [place] [address]     Procedures and diagnostic studies:  DG Abd 1 View  Result Date: 11/14/2021 CLINICAL DATA:  NG tube placement. EXAM: ABDOMEN - 1 VIEW COMPARISON:  KUB, 06/07/2018.  CT AP, 11/13/2017. FINDINGS: Enteric feeding tube with tip within stomach, however the side port appears positioned at the GE junction. Diaphragmatic hernia with bowel interposition RIGHT upper quadrant. IMPRESSION: 1. Enteric feeding tube with tip within stomach, however the side port appears positioned at the GE junction. Consider advancement by 5 cm for appropriate placement. 2. Diaphragmatic hernia with bowel interposition RIGHT upper quadrant. Electronically Signed   By: Roanna Banning M.D.   On: 11/14/2021 08:22   CT ABDOMEN PELVIS W CONTRAST  Result Date: 11/14/2021 CLINICAL DATA:  Left lower quadrant pain EXAM: CT ABDOMEN AND PELVIS WITH CONTRAST TECHNIQUE: Multidetector CT imaging of the abdomen and pelvis was performed using the standard protocol following bolus administration of intravenous contrast. RADIATION DOSE REDUCTION: This exam was performed according to the departmental dose-optimization program which includes automated exposure control, adjustment of the mA and/or kV according to patient size and/or use of iterative reconstruction technique. CONTRAST:  OMNIPAQUE IOHEXOL 300 MG/ML  SOLN COMPARISON:  05/16/2019 FINDINGS: Lower chest: Linear scarring at the right lung base. Large anterior diaphragmatic hernia containing large and small bowel loops. Several of the small bowel loops are mildly dilated and fluid-filled. No effusions. Hepatobiliary: No focal hepatic abnormality. Gallbladder unremarkable. Pancreas: No focal abnormality or ductal dilatation. Spleen: No focal abnormality.  Normal size. Adrenals/Urinary Tract: Low-density nodule in the left adrenal gland  measures 13 mm, stable since prior study compatible with adenoma. Right adrenal gland and kidneys unremarkable. Right kidney malrotated. No hydronephrosis. Urinary bladder decompressed, grossly unremarkable. Stomach/Bowel: Dilated fluid-filled small bowel loops. Distal small bowel loops are decompressed. Exact transition point is difficult to visualize, but might be related to the large anterior diaphragmatic hernia. Several of the small bowel loops within the right lower chest/hernia are also dilated and fluid-filled. Vascular/Lymphatic: No evidence of aneurysm or adenopathy. Reproductive: Uterus and adnexa unremarkable.  No mass. Other: No free fluid or free air. Musculoskeletal: No acute bony abnormality. IMPRESSION: Findings compatible with distal small bowel obstruction. There is a large anterior diaphragmatic hernia which contains large bowel and multiple small bowel loops. Several of these small bowel loops are also dilated and  fluid-filled within the hernia. It is difficult to detect the exact transition/cause, but may be related to the diaphragmatic hernia. Electronically Signed   By: Charlett Nose M.D.   On: 11/14/2021 00:21               LOS: 0 days   Tima Curet  [facility]   Pager on [email or web address]. If 7PM-7AM, please contact night-coverage at [email or web address]     11/14/2021, 11:47 AM

## 2021-11-14 NOTE — ED Notes (Signed)
Pt back from CT

## 2021-11-14 NOTE — Assessment & Plan Note (Addendum)
Large diaphragmatic hernia on CT, stable from 2020 ?Dr. Christian Mate advises that patient may need referral for management of diaphragmatic hernia ?

## 2021-11-14 NOTE — H&P (Signed)
?History and Physical  ? ? ?Patient: Maria Hodge FBP:[id number] DOB: [birth year] ?DOA: 11/13/2021 ?DOS: the patient was seen and examined on 11/14/2021 ?PCP: Patient, No Pcp Per (Inactive)  ?Patient coming from: Home ? ?Chief Complaint:  ?Chief Complaint  ?Patient presents with  ? Abdominal Pain  ? ? ?HPI: Maria Hodge is a 61 y.o. female with medical history significant for Bipolar mood disorder, hypertension and large diaphragmatic hernia who presents to the ED with a 2-day history of left-sided abdominal pain associated with nausea vomiting and diarrhea.  She denies fever or chills.  She denies affected contacts or ingestion of offending foods.  On arrival tachycardic at 111 with otherwise normal vitals.  Blood work significant for WBC of 7.3 with hemoglobin of 16.2.  CMP unremarkable.  Lipase 31.  Urinalysis unremarkable.  CT abdomen and pelvis showed "Findings compatible with distal small bowel obstruction. There is a ?large anterior diaphragmatic hernia which contains large bowel and ?multiple small bowel loops. Several of these small bowel loops are ?also dilated and fluid-filled within the hernia. It is difficult to ?detect the exact transition/cause, but may be related to the ?diaphragmatic hernia. ? ?Patient treated with IV pain medication and IV antiemetics.  Hospitalist consulted for admission. ? ?Review of Systems: As mentioned in the history of present illness. All other systems reviewed and are negative. ?Past Medical History:  ?Diagnosis Date  ? Anxiety   ? Arrhythmia   ? Arthritis   ? hands  ? Depression   ? GERD (gastroesophageal reflux disease)   ? Heart murmur   ? Hypertension   ? Wears dentures   ? partial upper  ? ?Past Surgical History:  ?Procedure Laterality Date  ? BACK SURGERY  2010  ? ORIF ANKLE FRACTURE Left 11/09/2017  ? Procedure: OPEN REDUCTION INTERNAL FIXATION (ORIF) ANKLE FRACTURE WITH POSSIBLE SYNDESMOSIS REPAIR;  Surgeon: Signa Kell, MD;  Location[facility];  Service:  Orthopedics;  Laterality: Left;  GENERAL WITH NERVE BLOCK ?ALEX SYNDEAMOSIS TIGHTROPE ?MARK ANKLE FRACTURE PLATES  ?C ARM  ?PLASTER SPLINT  ? STERIOD INJECTION  12/21/2011  ? Procedure: MINOR STEROID INJECTION;  Surgeon: Mat Carne, MD;  Location: [facility];  Service: Orthopedics;  Laterality: Right;  Right L3-4 transforaminal epidural steroid injection, attempted right L4-5 epidural steroid injection( 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)  ? ?Social History:  reports that she has been smoking cigarettes. She started smoking about 29 years ago. She has been smoking an average of .5 packs per day. She has never used smokeless tobacco. She reports that she does not drink alcohol and does not use drugs. ? ?Allergies  ?Allergen Reactions  ? Penicillins Anaphylaxis and Swelling  ? Aspirin Other (See Comments)  ?  Gastritis  ? ? ?Family History  ?Problem Relation Age of Onset  ? Heart failure Mother   ? Hypertension Mother   ? Depression Mother   ? Anxiety disorder Mother   ? Cancer Father   ? Rheum arthritis Father   ? Heart failure Sister   ? Rheum arthritis Sister   ? Heart failure Sister   ? Cancer Sister   ? ? ?Prior to Admission medications   ?Medication Sig Start Date End Date Taking? Authorizing Provider  ?amitriptyline (ELAVIL) 150 MG tablet TAKE 1 TABLET BY MOUTH EVERYDAY AT BEDTIME 08/12/21   Clapacs, Jackquline Denmark, MD  ?buPROPion (WELLBUTRIN XL) 150 MG 24 hr tablet Take 1 tablet (150 mg total) by mouth daily. 08/12/21  Clapacs, Jackquline DenmarkJohn T, MD  ?clonazePAM (KLONOPIN) 0.5 MG tablet Take 1 tablet (0.5 mg total) by mouth 2 (two) times daily. 08/08/21   Clapacs, Jackquline DenmarkJohn T, MD  ?clonazePAM (KLONOPIN) 0.5 MG tablet Take 1 tablet (0.5 mg total) by mouth 2 (two) times daily. 08/12/21 08/12/22  Clapacs, Jackquline DenmarkJohn T, MD  ?esomeprazole (NEXIUM) 20 MG capsule Take 20 mg by mouth daily.    [provider]  ?ondansetron (ZOFRAN ODT) 4 MG disintegrating tablet Take 1 tablet (4 mg total) by mouth every 8  (eight) hours as needed for nausea or vomiting. 12/30/20   Merwyn KatosBradler, Evan K, MD  ?polyethylene glycol [facility](MIRALAX) packet Take 17 g by mouth daily. 06/07/18   Jeanmarie PlantMcShane, James A, MD  ?QUEtiapine (SEROQUEL) 400 MG tablet TAKE 1 TABLET BY MOUTH TWICE A DAY 07/25/20   Clapacs, Jackquline DenmarkJohn T, MD  ?QUEtiapine (SEROQUEL) 400 MG tablet Take 1 tablet (400 mg total) by mouth 2 (two) times daily. 07/25/20   Clapacs, Jackquline DenmarkJohn T, MD  ?QUEtiapine (SEROQUEL) 400 MG tablet TAKE 1 TABLET BY MOUTH TWICE A DAY 03/24/21   Clapacs, Jackquline DenmarkJohn T, MD  ?QUEtiapine (SEROQUEL) 400 MG tablet Take 1 tablet (400 mg total) by mouth 2 (two) times daily. 07/30/20   Clapacs, Jackquline DenmarkJohn T, MD  ?QUEtiapine (SEROQUEL) 400 MG tablet Take 1 tablet (400 mg total) by mouth 2 (two) times daily. 08/12/21   Clapacs, Jackquline DenmarkJohn T, MD  ?traMADol (ULTRAM) 50 MG tablet Take 1 tablet (50 mg total) by mouth 2 (two) times daily. 03/01/18   Menshew, Charlesetta IvoryJenise V Bacon, PA-C  ? ? ?Physical Exam: ?Vitals:  ? 11/13/21 2030 11/13/21 2315 11/14/21 0035 11/14/21 0135  ?BP: 115/85 118/78 117/73 135/83  ?Pulse: (!) 111 98 93 (!) 103  ?Resp: 18 18 18 20   ?Temp: 98.2 ?F (36.8 ?C)     ?TempSrc: Oral     ?SpO2: 95% 96% 98% 95%  ?Weight:      ?Height:      ? ?Physical Exam ?Vitals and nursing note reviewed.  ?Constitutional:   ?   General: She is not in acute distress. ?   Appearance: Normal appearance.  ?HENT:  ?   Head: Normocephalic and atraumatic.  ?Cardiovascular:  ?   Rate and Rhythm: Normal rate and regular rhythm.  ?   Pulses: Normal pulses.  ?   Heart sounds: Normal heart sounds. No murmur heard. ?Pulmonary:  ?   Effort: Pulmonary effort is normal.  ?   Breath sounds: Normal breath sounds. No wheezing or rhonchi.  ?Abdominal:  ?   General: Bowel sounds are increased.  ?   Palpations: Abdomen is soft.  ?   Tenderness: There is abdominal tenderness in the periumbilical area.  ?Musculoskeletal:     ?   General: No swelling or tenderness. Normal range of motion.  ?   Cervical back: Normal range of motion and neck  supple.  ?Skin: ?   General: Skin is warm and dry.  ?Neurological:  ?   General: No focal deficit present.  ?   Mental Status: She is alert. Mental status is at baseline.  ?Psychiatric:     ?   Mood and Affect: Mood normal.     ?   Behavior: Behavior normal.  ? ? ? ?Data Reviewed: ?Relevant notes from primary care and specialist visits, past discharge summaries as available in EHR, including Care Everywhere. ?Prior diagnostic testing as pertinent to current admission diagnoses ?Updated medications and problem lists for reconciliation ?ED course, including vitals, labs,  imaging, treatment and response to treatment ?Triage notes, nursing and pharmacy notes and ED provider's notes ?Notable results as noted in HPI ? ? ?Assessment and Plan: ?* Small bowel obstruction (HCC) ?N.p.o. ?IV hydration, IV antiemetics, pain control ?Surgical consult: Discussed with Dr. Claudine Mouton via secure chat.  He advises to put NG tube.  Will see patient ? ?Bipolar mood disorder (HCC) ?Hold quetiapine for now given n.p.o. status ? ?Diaphragmatic hernia ?Large diaphragmatic hernia on CT, stable from 2020 ?Dr. Claudine Mouton advises that patient may need referral for management of diaphragmatic hernia ? ?Hypertension ?Hydralazine IV, as needed while n.p.o. ? ? ? ? ? ? ?Advance Care Planning:   Code Status: Full Code  ? ?Consults: Surgery, Dr. Dolores Frame ? ?Family Communication: none ? ?Severity of Illness: ?The appropriate patient status for this patient is INPATIENT. Inpatient status is judged to be reasonable and necessary in order to provide the required intensity of service to ensure the patient's safety. The patient's presenting symptoms, physical exam findings, and initial radiographic and laboratory data in the context of their chronic comorbidities is felt to place them at high risk for further clinical deterioration. Furthermore, it is not anticipated that the patient will be medically stable for discharge from the hospital within 2  midnights of admission.  ? ?* I certify that at the point of admission it is my clinical judgment that the patient will require inpatient hospital care spanning beyond 2 midnights from the point of admission due to hi

## 2021-11-15 DIAGNOSIS — Q790 Congenital diaphragmatic hernia: Secondary | ICD-10-CM | POA: Diagnosis not present

## 2021-11-15 DIAGNOSIS — K56609 Unspecified intestinal obstruction, unspecified as to partial versus complete obstruction: Secondary | ICD-10-CM | POA: Diagnosis not present

## 2021-11-15 LAB — BASIC METABOLIC PANEL
Anion gap: 9 (ref 5–15)
BUN: <5 mg/dL — ABNORMAL LOW (ref 6–20)
CO2: 27 mmol/L (ref 22–32)
Calcium: 8.2 mg/dL — ABNORMAL LOW (ref 8.9–10.3)
Chloride: 102 mmol/L (ref 98–111)
Creatinine, Ser: 0.65 mg/dL (ref 0.44–1.00)
GFR, Estimated: >60 mL/min (ref >60)
Glucose, Bld: 104 mg/dL — ABNORMAL HIGH (ref 70–99)
Potassium: 3.2 mmol/L — ABNORMAL LOW (ref 3.5–5.1)
Sodium: 138 mmol/L (ref 135–145)

## 2021-11-15 LAB — MAGNESIUM: Magnesium: 2 mg/dL (ref 1.7–2.4)

## 2021-11-15 LAB — PHOSPHORUS: Phosphorus: 2.4 mg/dL — ABNORMAL LOW (ref 2.5–4.6)

## 2021-11-15 MED ORDER — ALUM & MAG HYDROXIDE-SIMETH 200-200-20 MG/5ML PO SUSP
30.0000 mL | Freq: Four times a day (QID) | ORAL | Status: DC | PRN
  Administered 2021-11-16: 30 mL via ORAL
  Filled 2021-11-15 (×2): qty 30

## 2021-11-15 MED ORDER — MORPHINE SULFATE (PF) 2 MG/ML IV SOLN
2.0000 mg | Freq: Once | INTRAVENOUS | Status: AC
  Administered 2021-11-15: 2 mg via INTRAVENOUS
  Filled 2021-11-15: qty 1

## 2021-11-15 MED ORDER — KCL IN DEXTROSE-NACL 40-5-0.9 MEQ/L-%-% IV SOLN
INTRAVENOUS | Status: DC
Start: 2021-11-15 — End: 2021-11-16
  Filled 2021-11-15 (×3): qty 1000

## 2021-11-15 NOTE — Plan of Care (Signed)

## 2021-11-15 NOTE — Progress Notes (Signed)
11/15/2021 ? ?Subjective: ?No acute events overnight.  Patient reports that she started having flatus a lot last night and this morning.  Denies any nausea.  NG output was recorded at 850 mL.  Denies any abdominal pain. ? ?Vital signs: ?Temp:  [97.9 ?F (36.6 ?C)-98.6 ?F (37 ?C)] 98 ?F (36.7 ?C) (03/18 0831) ?Pulse Rate:  [90-100] 96 (03/18 0831) ?Resp:  [18-20] 18 (03/18 0831) ?BP: (118-132)/(77-87) 132/87 (03/18 0831) ?SpO2:  [93 %-94 %] 93 % (03/18 0831)  ? ?Intake/Output: ?03/17 0701 - 03/18 0700 ?In: 1732.6 [I.V.:1732.6] ?Out: 850 [Emesis/NG output:850] ?Last BM Date : 11/11/21 ? ?Physical Exam: ?Constitutional: No acute distress ?Abdomen: Soft, nondistended, nontender to palpation. ? ?Labs:  ?Recent Labs  ?  11/13/21 ?2031  ?WBC 7.3  ?HGB 16.2*  ?HCT 49.3*  ?PLT 206  ? ?Recent Labs  ?  11/13/21 ?2031 11/15/21 ?0604  ?NA 139 138  ?K 3.7 3.2*  ?CL 97* 102  ?CO2 28 27  ?GLUCOSE 114* 104*  ?BUN 10 <5*  ?CREATININE 0.84 0.65  ?CALCIUM 9.7 8.2*  ? ?No results for input(s): LABPROT, INR in the last 72 hours. ? ?Imaging: ?No results found. ? ?Assessment/Plan: ?This is a 61 y.o. 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 ? ?- Patient seems to have regained bowel function and denies any further pain.  We will do a NG clamping trial today and if residuals low may be able to DC NG tube and start her on clears. ?- Otherwise continue medical management per primary team. ? ? ?I spent 35 minutes dedicated to the care of this patient on the date of this encounter to include pre-visit review of records, face-to-face time with the patient discussing diagnosis and management, and any post-visit coordination of care. ? ?Melvyn Neth, MD ?[facility]  ?

## 2021-11-15 NOTE — Progress Notes (Signed)
? ? ? ?Progress Note  ? ? ?Maria Hodge  KGY:[id number] DOB: 06/06/1961  DOA: 11/13/2021 ?PCP: Patient, No Pcp Per (Inactive)  ? ? ? ? ?Brief Narrative:  ? ? ?Medical records reviewed and are as summarized below: ? ?Maria Hodge is a 61 y.o. female with medical history significant for bipolar disorder, hypertension, large diaphragmatic hernia, who presented to the hospital with left-sided abdominal pain, nausea, vomiting and diarrhea. ? ?She was found to have small bowel obstruction.  NG tube was placed for gastric decompression.  She was treated with analgesics, antiemetics and IV fluids. ? ? ? ?Assessment/Plan:  ? ?Principal Problem: ?  Small bowel obstruction (HCC) ?Active Problems: ?  Hypertension ?  Diaphragmatic hernia ?  Bipolar mood disorder (HCC) ?  Malnutrition of moderate degree ? ? ?Nutrition Problem: Moderate Malnutrition ?Etiology: chronic illness (diaphragmatic hernia) ? ?Signs/Symptoms: moderate fat depletion, moderate muscle depletion ? ? ?Body mass index is 21.63 kg/m?. ? ? ?Small bowel obstruction with underlying diaphragmatic hernia: NG tube connected to intermittent low wall suction.  She remains n.p.o. for now with plan to start clear liquid diet later today.  General Careers adviser.  Continue IV fluids for now.  Continue analgesics and antiemetics as needed. ?Follow-up with general surgeon.  Referral to a thoracic surgeon in the future for evaluation and repair of diaphragmatic hernia. ? ?Hypokalemia: Replete potassium and monitor level ? ?Bipolar disorder, anxiety: Continue Seroquel and clonazepam.  She has been on clonazepam for about 9 years. ? ?Hypertension: BP is okay ? ? ? ?Diet Order   ? ?       ?  Diet NPO time specified Except for: Sips with Meds  Diet effective now       ?  ? ?  ?  ? ?  ? ? ? ? ? ? ? ? ?Consultants: ?General surgeon ? ?Procedures: ?None ? ? ? ?Medications:  ? ? amitriptyline  150 mg Oral QHS  ? buPROPion  150 mg Oral Daily  ? clonazePAM  0.5 mg Oral BID  ? heparin  injection (subcutaneous)  5,000 Units Subcutaneous Q8H  ? QUEtiapine  400 mg Oral BID  ? ?Continuous Infusions: ? dextrose 5 % and 0.9 % NaCl with KCl 40 mEq/L 100 mL/hr at 11/15/21 1209  ? ? ? ?Anti-infectives (From admission, onward)  ? ? None  ? ?  ? ? ? ? ? ? ? ? ? ?Family Communication/Anticipated D/C date and plan/Code Status  ? ?DVT prophylaxis: heparin injection 5,000 Units Start: 11/14/21 0645 ? ? ?  Code Status: Full Code ? ?Family Communication: None ?Disposition Plan: Plan to discharge home in 2 to 3 days ? ? ?Status is: Inpatient ?Remains inpatient appropriate because: Small bowel obstruction ? ? ? ? ? ? ?Subjective:  ? ?She is passing gas but no bowel movement.  She still has some abdominal pain on the left side. ? ?Objective:  ? ? ?Vitals:  ? 11/14/21 1603 11/14/21 2132 11/15/21 0427 11/15/21 0831  ?BP: 119/80 132/87 118/77 132/87  ?Pulse: 90 95 100 96  ?Resp: 18 20 18 18   ?Temp: 97.9 ?F (36.6 ?C) 98.6 ?F (37 ?C) 98.6 ?F (37 ?C) 98 ?F (36.7 ?C)  ?TempSrc: Oral     ?SpO2: 93% 93% 94% 93%  ?Weight:      ?Height:      ? ?No data found. ? ? ?Intake/Output Summary (Last 24 hours) at 11/15/2021 1447 ?Last data filed at 11/15/2021 1121 ?Gross per 24 hour  ?Intake  2290.75 ml  ?Output 1100 ml  ?Net 1190.75 ml  ? ?Filed Weights  ? 11/13/21 2030  ?Weight: 59 kg  ? ? ?Exam: ? ?GEN: NAD ?SKIN: No rash ?EYES: EOMI ?ENT: MMM, NG tube in place ?CV: RRR ?PULM: CTA B ?ABD: soft, ND, NT, +BS ?CNS: AAO x 3, non focal ?EXT: No edema or tenderness ? ? ?  ? ? ?Data Reviewed:  ? ?I have personally reviewed following labs and imaging studies: ? ?Labs: ?Labs show the following:  ? ?Basic Metabolic Panel: ?Recent Labs  ?Lab 11/13/21 ?2031 11/15/21 ?0604  ?NA 139 138  ?K 3.7 3.2*  ?CL 97* 102  ?CO2 28 27  ?GLUCOSE 114* 104*  ?BUN 10 <5*  ?CREATININE 0.84 0.65  ?CALCIUM 9.7 8.2*  ?MG  --  2.0  ?PHOS  --  2.4*  ? ?GFR ?Estimated Creatinine Clearance: 67.3 mL/min (by C-G formula based on SCr of 0.65 mg/dL). ?Liver Function  Tests: ?Recent Labs  ?Lab 11/13/21 ?2031  ?AST 23  ?ALT 20  ?ALKPHOS 88  ?BILITOT 0.4  ?PROT 9.2*  ?ALBUMIN 4.2  ? ?Recent Labs  ?Lab 11/13/21 ?2031  ?LIPASE 31  ? ?No results for input(s): AMMONIA in the last 168 hours. ?Coagulation profile ?No results for input(s): INR, PROTIME in the last 168 hours. ? ?CBC: ?Recent Labs  ?Lab 11/13/21 ?2031  ?WBC 7.3  ?HGB 16.2*  ?HCT 49.3*  ?MCV 91.0  ?PLT 206  ? ?Cardiac Enzymes: ?No results for input(s): CKTOTAL, CKMB, CKMBINDEX, TROPONINI in the last 168 hours. ?BNP (last 3 results) ?No results for input(s): PROBNP in the last 8760 hours. ?CBG: ?No results for input(s): GLUCAP in the last 168 hours. ?D-Dimer: ?No results for input(s): DDIMER in the last 72 hours. ?Hgb A1c: ?No results for input(s): HGBA1C in the last 72 hours. ?Lipid Profile: ?No results for input(s): CHOL, HDL, LDLCALC, TRIG, CHOLHDL, LDLDIRECT in the last 72 hours. ?Thyroid function studies: ?No results for input(s): TSH, T4TOTAL, T3FREE, THYROIDAB in the last 72 hours. ? ?Invalid input(s): FREET3 ?Anemia work up: ?No results for input(s): VITAMINB12, FOLATE, FERRITIN, TIBC, IRON, RETICCTPCT in the last 72 hours. ?Sepsis Labs: ?Recent Labs  ?Lab 11/13/21 ?2031  ?WBC 7.3  ? ? ?Microbiology ?Recent Results (from the past 240 hour(s))  ?Resp Panel by RT-PCR (Flu A&B, Covid) Nasopharyngeal Swab     Status: None  ? Collection Time: 11/14/21  2:59 AM  ? Specimen: Nasopharyngeal Swab; Nasopharyngeal(NP) swabs in vial transport medium  ?Result Value Ref Range Status  ? SARS Coronavirus 2 by RT PCR NEGATIVE NEGATIVE Final  ?  Comment: (NOTE) ?SARS-CoV-2 target nucleic acids are NOT DETECTED. ? ?The SARS-CoV-2 RNA is generally detectable in upper respiratory ?specimens during the acute phase of infection. The lowest ?concentration of SARS-CoV-2 viral copies this assay can detect is ?138 copies/mL. A negative result does not preclude SARS-Cov-2 ?infection and should not be used as the sole basis for treatment  or ?other patient management decisions. A negative result may occur with  ?improper specimen collection/handling, submission of specimen other ?than nasopharyngeal swab, presence of viral mutation(s) within the ?areas targeted by this assay, and inadequate number of viral ?copies(<138 copies/mL). A negative result must be combined with ?clinical observations, patient history, and epidemiological ?information. The expected result is Negative. ? ?Fact Sheet for Patients:  ?BloggerCourse.com ? ?Fact Sheet for Healthcare Providers:  ?SeriousBroker.it ? ?This test is no t yet approved or cleared by the [place] and  ?has been authorized for detection  and/or diagnosis of SARS-CoV-2 by ?FDA under an Emergency Use Authorization (EUA). This EUA will remain  ?in effect (meaning this test can be used) for the duration of the ?COVID-19 declaration under Section 564(b)(1) of the Act, 21 ?U.S.C.section 360bbb-3(b)(1), unless the authorization is terminated  ?or revoked sooner.  ? ? ?  ? Influenza A by PCR NEGATIVE NEGATIVE Final  ? Influenza B by PCR NEGATIVE NEGATIVE Final  ?  Comment: (NOTE) ?The Xpert Xpress SARS-CoV-2/FLU/RSV plus assay is intended as an aid ?in the diagnosis of influenza from Nasopharyngeal swab specimens and ?should not be used as a sole basis for treatment. Nasal washings and ?aspirates are unacceptable for Xpert Xpress SARS-CoV-2/FLU/RSV ?testing. ? ?Fact Sheet for Patients: ?BloggerCourse.comhttps://www.fda.gov/media/152166/download ? ?Fact Sheet for Healthcare Providers: ?SeriousBroker.ithttps://www.fda.gov/media/152162/download ? ?This test is not yet approved or cleared by the [place] FDA and ?has been authorized for detection and/or diagnosis of SARS-CoV-2 by ?FDA under an Emergency Use Authorization (EUA). This EUA will remain ?in effect (meaning this test can be used) for the duration of the ?COVID-19 declaration under Section 564(b)(1) of the Act, 21 U.S.C. ?section  360bbb-3(b)(1), unless the authorization is terminated or ?revoked. ? ?Performed at [facility], [address] [place], ?[place] [id number] ?  ? ? ?Procedures and diagnostic studies: ? ?DG Abd 1 View ? ?Result Date

## 2021-11-15 NOTE — TOC CM/SW Note (Signed)
?  Transition of Care (TOC) Screening Note ? ? ?Patient Details  ?Name: Maria Hodge ?Date of Birth: [birth year] ? ? ?Transition of Care (TOC) CM/SW Contact:    ?Ademide Schaberg E Damauri Minion, LCSW ?Phone Number: ?11/15/2021, 4:23 PM ? ? ? ?Transition of Care Department [facility]) has reviewed patient and no TOC needs have been identified at this time. We will continue to monitor patient advancement through interdisciplinary progression rounds. If new patient transition needs arise, please place a TOC consult. ? ? ?

## 2021-11-15 NOTE — Progress Notes (Signed)
Pt accidentally pulled her NGT out. Dr Aleen Campi informed. ?

## 2021-11-16 DIAGNOSIS — K56609 Unspecified intestinal obstruction, unspecified as to partial versus complete obstruction: Secondary | ICD-10-CM | POA: Diagnosis not present

## 2021-11-16 DIAGNOSIS — Q790 Congenital diaphragmatic hernia: Secondary | ICD-10-CM | POA: Diagnosis not present

## 2021-11-16 LAB — BASIC METABOLIC PANEL
Anion gap: 5 (ref 5–15)
BUN: <5 mg/dL — ABNORMAL LOW (ref 6–20)
CO2: 29 mmol/L (ref 22–32)
Calcium: 8.5 mg/dL — ABNORMAL LOW (ref 8.9–10.3)
Chloride: 106 mmol/L (ref 98–111)
Creatinine, Ser: 0.51 mg/dL (ref 0.44–1.00)
GFR, Estimated: >60 mL/min (ref >60)
Glucose, Bld: 114 mg/dL — ABNORMAL HIGH (ref 70–99)
Potassium: 3.7 mmol/L (ref 3.5–5.1)
Sodium: 140 mmol/L (ref 135–145)

## 2021-11-16 LAB — PHOSPHORUS: Phosphorus: 2.7 mg/dL (ref 2.5–4.6)

## 2021-11-16 LAB — MAGNESIUM: Magnesium: 2.1 mg/dL (ref 1.7–2.4)

## 2021-11-16 MED ORDER — POTASSIUM CHLORIDE CRYS ER 20 MEQ PO TBCR
40.0000 meq | EXTENDED_RELEASE_TABLET | Freq: Once | ORAL | Status: AC
Start: 2021-11-16 — End: 2021-11-16
  Administered 2021-11-16: 40 meq via ORAL
  Filled 2021-11-16: qty 2

## 2021-11-16 NOTE — Progress Notes (Signed)
Patient insistent on leaving. AMA form given to the patient to sign. Patient sent out via wheelchair to husband waiting car ?

## 2021-11-16 NOTE — Plan of Care (Signed)
?  Problem: Clinical Measurements: ?Goal: Ability to maintain clinical measurements within normal limits will improve ?Outcome: Progressing ?Goal: Will remain free from infection ?Outcome: Progressing ?Goal: Diagnostic test results will improve ?Outcome: Progressing ?Goal: Respiratory complications will improve ?Outcome: Progressing ?Goal: Cardiovascular complication will be avoided ?Outcome: Progressing ?  ?Problem: Pain Managment: ?Goal: General experience of comfort will improve ?Outcome: Progressing ?  ?Pt is involved in and agrees with the plan of care. V/S stable. Reports pain on her abdomen; morphine IV given. No nausea and vomiting noted. Complained of heartburn; maalox given. Passing gas but no BM yet.  ?

## 2021-11-16 NOTE — Progress Notes (Signed)
11/16/2021 ? ?Subjective: ?Patient's NG tube was clamped yesterday twice but there were issues with getting accurate measurements.  Last night, the patient "accidentally" pulled the NG tube out.  She reports this morning that she has been having flatus and denies any nausea or vomiting or worsening abdominal pain. ? ?Vital signs: ?Temp:  [98.2 ?F (36.8 ?C)-99.8 ?F (37.7 ?C)] 98.2 ?F (36.8 ?C) (03/19 [id number]) ?Pulse Rate:  [94-96] 95 (03/19 0834) ?Resp:  [16-18] 18 (03/19 0834) ?BP: (116-135)/(81-86) 127/86 (03/19 [id number]) ?SpO2:  [90 %-95 %] 95 % (03/19 0834)  ? ?Intake/Output: ?03/18 0701 - 03/19 0700 ?In: 1883.3 [I.V.:1883.3] ?Out: 725 [Emesis/NG output:725] ?Last BM Date : 11/11/21 ? ?Physical Exam: ?Constitutional: Acute distress ?Abdomen: Soft, nondistended, nontender ? ?Labs:  ?Recent Labs  ?  11/13/21 ?2031  ?WBC 7.3  ?HGB 16.2*  ?HCT 49.3*  ?PLT 206  ? ?Recent Labs  ?  11/15/21 ?0604 11/16/21 ?0503  ?NA 138 140  ?K 3.2* 3.7  ?CL 102 106  ?CO2 27 29  ?GLUCOSE 104* 114*  ?BUN <5* <5*  ?CREATININE 0.65 0.51  ?CALCIUM 8.2* 8.5*  ? ?No results for input(s): LABPROT, INR in the last 72 hours. ? ?Imaging: ?No results found. ? ?Assessment/Plan: ?This is a 61 y.o. female with small bowel obstruction in relationship to Morgagni diaphragmatic hernia. ? ?- Discussed with the patient that we will be able to start advancing her diet to a clear liquid diet this morning.  The patient wondered about leaving today and discussed with her that unfortunately I would not be recommended because we are just trying to advance her diet and strongly encouraged her to be able to do a clear liquid diet today with potentially a full liquid diet for dinner and then a soft diet for tomorrow morning.  However the patient insisted that she wanted to leave this morning.  I discussed with her that again this is not recommended and that if she were to leave this would be [place].  Patient understands this and still wants to go home. ?-  Have relayed this discussion to Dr. Mal Misty and her RN Ms. Wynetta Emery. ?-No follow-up is needed from the surgical standpoint.  However she would benefit from a referral to thoracic surgery for eventual repair of her diaphragmatic hernia. ? ? ?I spent 35 minutes dedicated to the care of this patient on the date of this encounter to include pre-visit review of records, face-to-face time with the patient discussing diagnosis and management, and any post-visit coordination of care. ? ?Melvyn Neth, MD ?[facility]  ?

## 2021-11-17 NOTE — Discharge Summary (Signed)
? ?Physician Discharge Summary  ?CHARLET HARR JAS:[id number] DOB: Apr 22, 1961 DOA: 11/13/2021 ? ?PCP: Patient, No Pcp Per (Inactive) ? ?Admit date: 11/13/2021 ?Discharge date: 11/17/2021 ? ?Discharge disposition: Left AGAINST MEDICAL ADVICE ? ? ?Recommendations for Outpatient Follow-Up:  ? ?Outpatient follow-up with PCP and thoracic surgeon recommended ? ? ?Discharge Diagnosis:  ? ?Principal Problem: ?  Small bowel obstruction (HCC) ?Active Problems: ?  Hypertension ?  Morgagni hernia ?  Bipolar mood disorder (HCC) ?  Malnutrition of moderate degree ? ? ? ?Discharge Condition: Stable. ? ? ? ? ?Hospital Course:  ? ?[name] is a 61 y.o. female with medical history significant for bipolar disorder, hypertension, large diaphragmatic hernia, who presented to the hospital with left-sided abdominal pain, nausea, vomiting and diarrhea. ?  ?She was found to have small bowel obstruction.  NG tube was placed for gastric decompression.  She was treated with analgesics, antiemetics and IV fluids.  She had hypokalemia that was treated.  Her condition improved but she was not quite ready for discharge.  However, she insisted on leaving the hospital AGAINST MEDICAL ADVICE.  She understands that there is increased risk for recurrent bowel obstruction which could be complicated by bowel perforation, severe sepsis, cardiopulmonary arrest and death.  Attempts to convince her to stay proved futile. ?  ? ? ? ?Medical Consultants:  ? ?General surgeon ? ? ?Discharge Exam:  ? ? ?Vitals:  ? 11/15/21 1631 11/15/21 1930 11/16/21 0355 11/16/21 0834  ?BP: 135/84 135/85 116/81 127/86  ?Pulse: 96 95 94 95  ?Resp: 18 16 18 18   ?Temp: 98.3 ?F (36.8 ?C) 98.4 ?F (36.9 ?C) 99.8 ?F (37.7 ?C) 98.2 ?F (36.8 ?C)  ?TempSrc:  Oral Oral Oral  ?SpO2: 94% 95% 90% 95%  ?Weight:      ?Height:      ? ? ? ?GEN: NAD ?SKIN: Warm and dry ?EYES: EOMI ?ENT: MMM ?CV: RRR ?PULM: CTA B ?ABD: soft, ND, NT, +BS ?CNS: AAO x 3, non focal ?EXT: No edema or  tenderness ? ? ?The results of significant diagnostics from this hospitalization (including imaging, microbiology, ancillary and laboratory) are listed below for reference.   ? ? ?Procedures and Diagnostic Studies:  ? ?DG Abd 1 View ? ?Result Date: 11/14/2021 ?CLINICAL DATA:  NG tube placement. EXAM: ABDOMEN - 1 VIEW COMPARISON:  KUB, 06/07/2018.  CT AP, 11/13/2017. FINDINGS: Enteric feeding tube with tip within stomach, however the side port appears positioned at the GE junction. Diaphragmatic hernia with bowel interposition RIGHT upper quadrant. IMPRESSION: 1. Enteric feeding tube with tip within stomach, however the side port appears positioned at the GE junction. Consider advancement by 5 cm for appropriate placement. 2. Diaphragmatic hernia with bowel interposition RIGHT upper quadrant. Electronically Signed   By: 11/15/2017 M.D.   On: 11/14/2021 08:22  ? ?CT ABDOMEN PELVIS W CONTRAST ? ?Result Date: 11/14/2021 ?CLINICAL DATA:  Left lower quadrant pain EXAM: CT ABDOMEN AND PELVIS WITH CONTRAST TECHNIQUE: Multidetector CT imaging of the abdomen and pelvis was performed using the standard protocol following bolus administration of intravenous contrast. RADIATION DOSE REDUCTION: This exam was performed according to the departmental dose-optimization program which includes automated exposure control, adjustment of the mA and/or kV according to patient size and/or use of iterative reconstruction technique. CONTRAST:  11/16/2021 OMNIPAQUE IOHEXOL 300 MG/ML  SOLN COMPARISON:  05/16/2019 FINDINGS: Lower chest: Linear scarring at the right lung base. Large anterior diaphragmatic hernia containing large and small bowel loops. Several of the small bowel  loops are mildly dilated and fluid-filled. No effusions. Hepatobiliary: No focal hepatic abnormality. Gallbladder unremarkable. Pancreas: No focal abnormality or ductal dilatation. Spleen: No focal abnormality.  Normal size. Adrenals/Urinary Tract: Low-density nodule in the  left adrenal gland measures 13 mm, stable since prior study compatible with adenoma. Right adrenal gland and kidneys unremarkable. Right kidney malrotated. No hydronephrosis. Urinary bladder decompressed, grossly unremarkable. Stomach/Bowel: Dilated fluid-filled small bowel loops. Distal small bowel loops are decompressed. Exact transition point is difficult to visualize, but might be related to the large anterior diaphragmatic hernia. Several of the small bowel loops within the right lower chest/hernia are also dilated and fluid-filled. Vascular/Lymphatic: No evidence of aneurysm or adenopathy. Reproductive: Uterus and adnexa unremarkable.  No mass. Other: No free fluid or free air. Musculoskeletal: No acute bony abnormality. IMPRESSION: Findings compatible with distal small bowel obstruction. There is a large anterior diaphragmatic hernia which contains large bowel and multiple small bowel loops. Several of these small bowel loops are also dilated and fluid-filled within the hernia. It is difficult to detect the exact transition/cause, but may be related to the diaphragmatic hernia. Electronically Signed   By: Charlett Nose M.D.   On: 11/14/2021 00:21  ? ? ? ?Labs:  ? ?Basic Metabolic Panel: ?Recent Labs  ?Lab 11/13/21 ?2031 11/15/21 ?0604 11/16/21 ?0503  ?NA 139 138 140  ?K 3.7 3.2* 3.7  ?CL 97* 102 106  ?CO2 28 27 29   ?GLUCOSE 114* 104* 114*  ?BUN 10 <5* <5*  ?CREATININE 0.84 0.65 0.51  ?CALCIUM 9.7 8.2* 8.5*  ?MG  --  2.0 2.1  ?PHOS  --  2.4* 2.7  ? ?GFR ?Estimated Creatinine Clearance: 67.3 mL/min (by C-G formula based on SCr of 0.51 mg/dL). ?Liver Function Tests: ?Recent Labs  ?Lab 11/13/21 ?2031  ?AST 23  ?ALT 20  ?ALKPHOS 88  ?BILITOT 0.4  ?PROT 9.2*  ?ALBUMIN 4.2  ? ?Recent Labs  ?Lab 11/13/21 ?2031  ?LIPASE 31  ? ?No results for input(s): AMMONIA in the last 168 hours. ?Coagulation profile ?No results for input(s): INR, PROTIME in the last 168 hours. ? ?CBC: ?Recent Labs  ?Lab 11/13/21 ?2031  ?WBC 7.3   ?HGB 16.2*  ?HCT 49.3*  ?MCV 91.0  ?PLT 206  ? ?Cardiac Enzymes: ?No results for input(s): CKTOTAL, CKMB, CKMBINDEX, TROPONINI in the last 168 hours. ?BNP: ?Invalid input(s): POCBNP ?CBG: ?No results for input(s): GLUCAP in the last 168 hours. ?D-Dimer ?No results for input(s): DDIMER in the last 72 hours. ?Hgb A1c ?No results for input(s): HGBA1C in the last 72 hours. ?Lipid Profile ?No results for input(s): CHOL, HDL, LDLCALC, TRIG, CHOLHDL, LDLDIRECT in the last 72 hours. ?Thyroid function studies ?No results for input(s): TSH, T4TOTAL, T3FREE, THYROIDAB in the last 72 hours. ? ?Invalid input(s): FREET3 ?Anemia work up ?No results for input(s): VITAMINB12, FOLATE, FERRITIN, TIBC, IRON, RETICCTPCT in the last 72 hours. ?Microbiology ?Recent Results (from the past 240 hour(s))  ?Resp Panel by RT-PCR (Flu A&B, Covid) Nasopharyngeal Swab     Status: None  ? Collection Time: 11/14/21  2:59 AM  ? Specimen: Nasopharyngeal Swab; Nasopharyngeal(NP) swabs in vial transport medium  ?Result Value Ref Range Status  ? SARS Coronavirus 2 by RT PCR NEGATIVE NEGATIVE Final  ?  Comment: (NOTE) ?SARS-CoV-2 target nucleic acids are NOT DETECTED. ? ?The SARS-CoV-2 RNA is generally detectable in upper respiratory ?specimens during the acute phase of infection. The lowest ?concentration of SARS-CoV-2 viral copies this assay can detect is ?138 copies/mL. A negative result does  not preclude SARS-Cov-2 ?infection and should not be used as the sole basis for treatment or ?other patient management decisions. A negative result may occur with  ?improper specimen collection/handling, submission of specimen other ?than nasopharyngeal swab, presence of viral mutation(s) within the ?areas targeted by this assay, and inadequate number of viral ?copies(<138 copies/mL). A negative result must be combined with ?clinical observations, patient history, and epidemiological ?information. The expected result is Negative. ? ?Fact Sheet for Patients:   ?BloggerCourse.comhttps://www.fda.gov/media/152166/download ? ?Fact Sheet for Healthcare Providers:  ?SeriousBroker.ithttps://www.fda.gov/media/152162/download ? ?This test is no t yet approved or cleared by the [place] FDA and  ?has been authorized fo

## 2021-12-15 DIAGNOSIS — S82831A Other fracture of upper and lower end of right fibula, initial encounter for closed fracture: Secondary | ICD-10-CM | POA: Diagnosis not present

## 2021-12-15 DIAGNOSIS — M7989 Other specified soft tissue disorders: Secondary | ICD-10-CM | POA: Diagnosis not present

## 2021-12-15 DIAGNOSIS — S99921A Unspecified injury of right foot, initial encounter: Secondary | ICD-10-CM | POA: Diagnosis not present

## 2021-12-15 DIAGNOSIS — S99911A Unspecified injury of right ankle, initial encounter: Secondary | ICD-10-CM | POA: Diagnosis not present

## 2021-12-15 DIAGNOSIS — M85871 Other specified disorders of bone density and structure, right ankle and foot: Secondary | ICD-10-CM | POA: Diagnosis not present

## 2021-12-15 DIAGNOSIS — S82431A Displaced oblique fracture of shaft of right fibula, initial encounter for closed fracture: Secondary | ICD-10-CM | POA: Diagnosis not present

## 2021-12-18 ENCOUNTER — Other Ambulatory Visit: Payer: Self-pay | Admitting: Podiatry

## 2021-12-18 ENCOUNTER — Encounter: Payer: Self-pay | Admitting: Podiatry

## 2021-12-18 DIAGNOSIS — S82831A Other fracture of upper and lower end of right fibula, initial encounter for closed fracture: Secondary | ICD-10-CM | POA: Diagnosis not present

## 2021-12-22 NOTE — Discharge Instructions (Signed)
[facility]  POST OPERATIVE INSTRUCTIONS FOR DR. FOWLER AND [facility] PODIATRY [facility]PART[facility]   Take your medication as prescribed.  Pain medication should be taken only as needed.  Keep the dressing clean, dry and intact.  Keep your foot elevated above the heart level for the first 48 hours.  Walking to the bathroom and brief periods of walking are acceptable, unless we have instructed you to be non-weight bearing.  Always wear your post-op shoe when walking.  Always use your crutches if you are to be non-weight bearing.  Do not take a shower. Baths are permissible as long as the foot is kept out of the water.   Every hour you are awake:  Bend your knee 15 times. Flex foot 15 times Massage calf 15 times  Call [facility] ([phone]) if any of the following problems occur: You develop a temperature or fever. The bandage becomes saturated with blood. Medication does not stop your pain. Injury of the foot occurs. Any symptoms of infection including redness, odor, or red streaks running from wound. 

## 2021-12-24 ENCOUNTER — Encounter: Payer: Self-pay | Admitting: Podiatry

## 2021-12-24 ENCOUNTER — Other Ambulatory Visit: Payer: Self-pay

## 2021-12-24 ENCOUNTER — Ambulatory Visit: Payer: Medicare HMO | Admitting: Anesthesiology

## 2021-12-24 ENCOUNTER — Ambulatory Visit
Admission: RE | Admit: 2021-12-24 | Discharge: 2021-12-24 | Disposition: A | Discharge status: Home / Self Care | Payer: Medicare HMO | Attending: Podiatry | Admitting: Podiatry

## 2021-12-24 ENCOUNTER — Ambulatory Visit: Payer: Self-pay

## 2021-12-24 ENCOUNTER — Encounter
Admission: RE | Disposition: A | Discharge status: Home / Self Care | Payer: Self-pay | Source: Home / Self Care | Attending: Podiatry

## 2021-12-24 DIAGNOSIS — F17210 Nicotine dependence, cigarettes, uncomplicated: Secondary | ICD-10-CM | POA: Diagnosis not present

## 2021-12-24 DIAGNOSIS — S82831A Other fracture of upper and lower end of right fibula, initial encounter for closed fracture: Secondary | ICD-10-CM | POA: Diagnosis not present

## 2021-12-24 DIAGNOSIS — G8918 Other acute postprocedural pain: Secondary | ICD-10-CM | POA: Diagnosis not present

## 2021-12-24 DIAGNOSIS — S8261XA Displaced fracture of lateral malleolus of right fibula, initial encounter for closed fracture: Secondary | ICD-10-CM | POA: Diagnosis not present

## 2021-12-24 DIAGNOSIS — W010XXA Fall on same level from slipping, tripping and stumbling without subsequent striking against object, initial encounter: Secondary | ICD-10-CM | POA: Insufficient documentation

## 2021-12-24 DIAGNOSIS — M25571 Pain in right ankle and joints of right foot: Secondary | ICD-10-CM | POA: Diagnosis not present

## 2021-12-24 HISTORY — PX: ORIF ANKLE FRACTURE: SHX5408

## 2021-12-24 SURGERY — OPEN REDUCTION INTERNAL FIXATION (ORIF) ANKLE FRACTURE
Anesthesia: General | Site: Ankle | Laterality: Right

## 2021-12-24 MED ORDER — PROPOFOL 10 MG/ML IV BOLUS
INTRAVENOUS | Status: DC | PRN
  Administered 2021-12-24: 100 mg via INTRAVENOUS

## 2021-12-24 MED ORDER — VANCOMYCIN HCL IN DEXTROSE 1-5 GM/200ML-% IV SOLN
1000.0000 mg | INTRAVENOUS | Status: AC
Start: 2021-12-25 — End: 2021-12-24
  Administered 2021-12-24: 1000 mg via INTRAVENOUS

## 2021-12-24 MED ORDER — LIDOCAINE HCL (CARDIAC) PF 100 MG/5ML IV SOSY
PREFILLED_SYRINGE | INTRAVENOUS | Status: DC | PRN
  Administered 2021-12-24: 25 mg via INTRATRACHEAL

## 2021-12-24 MED ORDER — LACTATED RINGERS IV SOLN: INTRAVENOUS | Status: DC

## 2021-12-24 MED ORDER — MIDAZOLAM HCL 5 MG/5ML IJ SOLN
INTRAMUSCULAR | Status: DC | PRN
Start: 2021-12-24 — End: 2021-12-24
  Administered 2021-12-24: 2 mg via INTRAVENOUS
  Administered 2021-12-24: 1 mg via INTRAVENOUS

## 2021-12-24 MED ORDER — OXYCODONE-ACETAMINOPHEN 5-325 MG PO TABS: 1.0000 | ORAL_TABLET | Freq: Four times a day (QID) | ORAL | 0 refills | Status: DC | PRN

## 2021-12-24 MED ORDER — FENTANYL CITRATE (PF) 100 MCG/2ML IJ SOLN
INTRAMUSCULAR | Status: DC | PRN
  Administered 2021-12-24: 100 ug via INTRAVENOUS

## 2021-12-24 MED ORDER — ONDANSETRON HCL 4 MG/2ML IJ SOLN
INTRAMUSCULAR | Status: DC | PRN
  Administered 2021-12-24: 4 mg via INTRAVENOUS

## 2021-12-24 MED ORDER — DEXAMETHASONE SODIUM PHOSPHATE 4 MG/ML IJ SOLN
INTRAMUSCULAR | Status: DC | PRN
Start: 2021-12-24 — End: 2021-12-24
  Administered 2021-12-24: 4 mg via INTRAVENOUS

## 2021-12-24 MED ORDER — ROPIVACAINE HCL 5 MG/ML IJ SOLN
INTRAMUSCULAR | Status: DC | PRN
  Administered 2021-12-24: 30 mL via EPIDURAL
  Administered 2021-12-24: 10 mL via EPIDURAL

## 2021-12-24 MED ORDER — SODIUM CHLORIDE 0.9 % IR SOLN
Status: DC | PRN
  Administered 2021-12-24: 500 mL

## 2021-12-24 SURGICAL SUPPLY — 37 items
BIT DRILL 2.0X130 SLD AO (BIT) ×1 IMPLANT
BIT DRILL 2.4X140 LONG SOLID (BIT) ×1 IMPLANT
BNDG CMPR 75X41 PLY HI ABS (GAUZE/BANDAGES/DRESSINGS) ×1
BNDG ESMARK 4X12 TAN STRL LF (GAUZE/BANDAGES/DRESSINGS) ×2 IMPLANT
BNDG STRETCH 4X75 STRL LF (GAUZE/BANDAGES/DRESSINGS) ×2 IMPLANT
BOOT STEPPER DURA SM (SOFTGOODS) ×1 IMPLANT
CANISTER SUCT 1200ML W/VALVE (MISCELLANEOUS) ×2 IMPLANT
DRAPE FLUOR MINI C-ARM 54X84 (DRAPES) ×2 IMPLANT
DURAPREP 26ML APPLICATOR (WOUND CARE) ×2 IMPLANT
ELECT REM PT RETURN 9FT ADLT (ELECTROSURGICAL) ×2
ELECTRODE REM PT RTRN 9FT ADLT (ELECTROSURGICAL) ×1 IMPLANT
GAUZE SPONGE 4X4 12PLY STRL (GAUZE/BANDAGES/DRESSINGS) ×2 IMPLANT
GAUZE XEROFORM 1X8 LF (GAUZE/BANDAGES/DRESSINGS) ×2 IMPLANT
GLOVE INDICATOR 8.0 STRL GRN (GLOVE) ×2 IMPLANT
GLOVE SURG ENC MOIS LTX SZ7.5 (GLOVE) ×2 IMPLANT
GOWN STRL REUS W/ TWL LRG LVL3 (GOWN DISPOSABLE) ×2 IMPLANT
GOWN STRL REUS W/TWL LRG LVL3 (GOWN DISPOSABLE) ×4
KIT TURNOVER KIT A (KITS) ×2 IMPLANT
NS IRRIG 500ML POUR BTL (IV SOLUTION) ×2 IMPLANT
PACK EXTREMITY ARMC (MISCELLANEOUS) ×2 IMPLANT
PENCIL SMOKE EVACUATOR (MISCELLANEOUS) ×2 IMPLANT
PLATE FIBULA 9HOLE ANATOMICAL (Plate) ×1 IMPLANT
SCREW LOCK PLATE R3 2.7X10 (Screw) ×1 IMPLANT
SCREW LOCK PLATE R3 2.7X11 (Screw) ×1 IMPLANT
SCREW LOCK PLATE R3 2.7X13 (Screw) ×1 IMPLANT
SCREW LOCK PLATE R3 2.7X15 (Screw) ×1 IMPLANT
SCREW LOCK PLATE R3 2.7X9 (Screw) ×2 IMPLANT
SCREW LOCK PLATE R3 3.5X10 (Screw) ×2 IMPLANT
SCREW LOCK PLATE R3 3.5X12 (Screw) ×1 IMPLANT
SCREW LOCK PLT 9X2.7X R3CON (Screw) IMPLANT
STOCKINETTE IMPERVIOUS LG (DRAPES) ×2 IMPLANT
STRIP CLOSURE SKIN 1/4X4 (GAUZE/BANDAGES/DRESSINGS) ×2 IMPLANT
SUT ETHILON 3-0 FS-10 30 BLK (SUTURE) ×2
SUT VIC AB 4-0 SH 27 (SUTURE) ×2
SUT VIC AB 4-0 SH 27XANBCTRL (SUTURE) IMPLANT
SUTURE EHLN 3-0 FS-10 30 BLK (SUTURE) IMPLANT
WIRE OLIVE SMOOTH 1.4MMX60MM (WIRE) ×2 IMPLANT

## 2021-12-24 NOTE — Anesthesia Preprocedure Evaluation (Signed)
Anesthesia Evaluation  ?Patient identified by MRN, date of birth, ID band ?Patient awake ? ? ? ?Reviewed: ?Allergy & Precautions, H&P , NPO status , Patient's Chart, lab work & pertinent test results, reviewed documented beta blocker date and time  ? ?Airway ?Mallampati: II ? ?TM Distance: >3 FB ?Neck ROM: full ? ? ? Dental ?no notable dental hx. ? ?  ?Pulmonary ?Current Smoker,  ?  ?Pulmonary exam normal ?breath sounds clear to auscultation ? ? ? ? ? ? Cardiovascular ?Exercise Tolerance: Good ?hypertension,  ?Rhythm:regular Rate:Normal ? ? ?  ?Neuro/Psych ?negative neurological ROS ? negative psych ROS  ? GI/Hepatic ?Neg liver ROS, GERD  ,  ?Endo/Other  ?negative endocrine ROS ? Renal/GU ?negative Renal ROS  ?negative genitourinary ?  ?Musculoskeletal ? ? Abdominal ?  ?Peds ? Hematology ?negative hematology ROS ?(+)   ?Anesthesia Other Findings ? ? Reproductive/Obstetrics ?negative OB ROS ? ?  ? ? ? ? ? ? ? ? ? ? ? ? ? ?  ?  ? ? ? ? ? ? ? ? ?Anesthesia Physical ?Anesthesia Plan ? ?ASA: 2 ? ?Anesthesia Plan: General and General LMA  ? ?Post-op Pain Management: Regional block  ? ?Induction:  ? ?PONV Risk Score and Plan:  ? ?Airway Management Planned:  ? ?Additional Equipment:  ? ?Intra-op Plan:  ? ?Post-operative Plan:  ? ?Informed Consent: I have reviewed the patients History and Physical, chart, labs and discussed the procedure including the risks, benefits and alternatives for the proposed anesthesia with the patient or authorized representative who has indicated his/her understanding and acceptance.  ? ? ? ?Dental Advisory Given ? ?Plan Discussed with: CRNA and Anesthesiologist ? ?Anesthesia Plan Comments:   ? ? ? ? ? ? ?Anesthesia Quick Evaluation ? ?

## 2021-12-24 NOTE — Progress Notes (Signed)
Assisted David Bacon ANMD with right, popliteal/saphenous block. Side rails up, monitors on throughout procedure. See vital signs in flow sheet. Tolerated Procedure well.  

## 2021-12-24 NOTE — Anesthesia Procedure Notes (Signed)
Anesthesia Regional Block: Popliteal block  ? ?Pre-Anesthetic Checklist: , timeout performed,  Correct Patient, Correct Site, Correct Laterality,  Correct Procedure, Correct Position, site marked,  Risks and benefits discussed,  Surgical consent,  Pre-op evaluation,  At surgeon's request and post-op pain management ? ?Laterality: Right ? ?Prep: chloraprep     ?  ?Needles:  ?Injection technique: Single-shot ? ?Needle Type: Echogenic Stimulator Needle   ? ? ?Needle Length: 9cm  ?Needle Gauge: 21  ? ? ? ?Additional Needles: ? ? ?Procedures:,,,, ultrasound used (permanent image in chart),,    ?Narrative:  ?Start time: 12/24/2021 12:50 PM ?End time: 12/24/2021 12:56 PM ?Injection made incrementally with aspirations every 5 mL. ? ?Performed by: Personally  ?Anesthesiologist: Baxter Flattery, MD ? ?Additional Notes: ?Functioning IV was confirmed and monitors applied. Ultrasound guidance: relevant anatomy identified, needle position confirmed, local anesthetic spread visualized around nerve(s)., vascular puncture avoided.  Image printed for medical record.  Negative aspiration and no paresthesias; incremental administration of local anesthetic. The patient tolerated the procedure well. Vitals signes recorded in RN notes. ? ? ? ?

## 2021-12-24 NOTE — Anesthesia Procedure Notes (Signed)
Anesthesia Regional Block: Adductor canal block  ? ?Pre-Anesthetic Checklist: , timeout performed,  Correct Patient, Correct Site, Correct Laterality,  Correct Procedure, Correct Position, site marked,  Risks and benefits discussed,  Surgical consent,  Pre-op evaluation,  At surgeon's request and post-op pain management ? ?Laterality: Right ? ?Prep: chloraprep     ?  ?Needles:  ?Injection technique: Single-shot ? ?Needle Type: Echogenic Stimulator Needle   ? ? ?Needle Length: 9cm  ?Needle Gauge: 21  ? ? ? ?Additional Needles: ? ? ?Procedures:,,,, ultrasound used (permanent image in chart),,    ?Narrative:  ?Start time: 12/24/2021 12:50 PM ?End time: 12/24/2021 12:56 PM ?Injection made incrementally with aspirations every 5 mL. ? ?Performed by: Personally  ?Anesthesiologist: Baxter Flattery, MD ? ?Additional Notes: ?Functioning IV was confirmed and monitors applied. Ultrasound guidance: relevant anatomy identified, needle position confirmed, local anesthetic spread visualized around nerve(s)., vascular puncture avoided.  Image printed for medical record.  Negative aspiration and no paresthesias; incremental administration of local anesthetic. The patient tolerated the procedure well. Vitals signes recorded in RN notes. ? ? ? ?

## 2021-12-24 NOTE — Anesthesia Procedure Notes (Signed)
Procedure Name: LMA Insertion ?Date/Time: 12/24/2021 1:36 PM ?Performed by: Jimmy Picket, CRNA ?Pre-anesthesia Checklist: Patient identified, Emergency Drugs available, Suction available, Timeout performed and Patient being monitored ?Patient Re-evaluated:Patient Re-evaluated prior to induction ?Oxygen Delivery Method: Circle system utilized ?Preoxygenation: Pre-oxygenation with 100% oxygen ?Induction Type: IV induction ?LMA: LMA inserted ?LMA Size: 3.0 ?Number of attempts: 1 ?Placement Confirmation: positive ETCO2 and breath sounds checked- equal and bilateral ?Tube secured with: Tape ? ? ? ? ?

## 2021-12-24 NOTE — Transfer of Care (Signed)
Immediate Anesthesia Transfer of Care Note ? ?Patient: Maria Hodge ? ?Procedure(s) Performed: OPEN REDUCTION INTERNAL FIXATION (ORIF) ANKLE FRACTURE (Right: Ankle) ? ?Patient Location: PACU ? ?Anesthesia Type: General, General LMA ? ?Level of Consciousness: awake, alert  and patient cooperative ? ?Airway and Oxygen Therapy: Patient Spontanous Breathing and Patient connected to supplemental oxygen ? ?Post-op Assessment: Post-op Vital signs reviewed, Patient's Cardiovascular Status Stable, Respiratory Function Stable, Patent Airway and No signs of Nausea or vomiting ? ?Post-op Vital Signs: Reviewed and stable ? ?Complications: No notable events documented. ? ?

## 2021-12-24 NOTE — H&P (Signed)
HISTORY AND PHYSICAL INTERVAL NOTE: ? ?12/24/2021 ? ?1:12 PM ? ?Maria Hodge  has presented today for surgery, with the diagnosis of S82.831A - Other closed fracture of distal end of right fibula.  The various methods of treatment have been discussed with the patient.  No guarantees were given.  After consideration of risks, benefits and other options for treatment, the patient has consented to surgery.  I have reviewed the patients? chart and labs.   ? ? ?Hodge history and physical examination was performed in my office.  The patient was reexamined.  There have been no changes to this history and physical examination. ? ?Maria Hodge ? ?

## 2021-12-24 NOTE — Op Note (Signed)
Operative note ? ? Surgeon:Sreya Froio Ether Griffins ? ?  Assistant: None ? ?  Preop diagnosis: Right distal fibular fracture ? ?  Postop diagnosis: Same ? ?  Procedure: 1.  ORIF right distal fibular fracture 2.  Intraoperative fluoroscopy ? ?  EBL: Minimal ? ?  Anesthesia:regional and general.  Popliteal block was placed in the preop holding area ? ?  Hemostasis: Thigh tourniquet inflated to 250 mmHg for 60 minutes ? ?  Specimen: None ? ?  Complications: None ? ?  Operative indications:Maria Hodge is an 61 y.o. that presents today for surgical intervention.  The risks/benefits/alternatives/complications have been discussed and consent has been given. ? ?  Procedure:  ?Patient was brought into the OR and placed on the operating table in thesupine position. After anesthesia was obtained theright lower extremity was prepped and draped in usual sterile fashion. ? ?Attention was directed to the lateral aspect of the right ankle.  A longitudinal incision was performed along the distal fibula.  Sharp and blunt dissection carried down to the periosteum.  Subperiosteal dissection was undertaken.  Of note there was a apparent sural nerve along the lateral aspect of the fibular incision.  This was retracted throughout the entire procedure.  At this time attention was directed to the fibula and a long oblique fibular fracture was noted.  This began at the distal anterior aspect of the fibula coursing to the posterior aspect of the fibula.  Compression was placed across the fracture site.  Next a anatomic locking plate was applied to the lateral aspect of the fibula from the Paragon 28 screw set.  Three 3.5 millimeter screws were placed proximal and multiple 2.7 millimeter screws were placed distal to the fracture site.  Anatomic alignment of the ankle joint mortise was noted at this time.  No rotation was noted.  At this time the wound was flushed with copious amounts of irrigation.  Stress was performed to the ankle without  distraction.  This was visualized under fluoroscopy.  The alignment of the ankle was noted under fluoroscopy.  At this time the wound was flushed with copious amounts of irrigation.  Layered closure was then performed with a 4-0 Vicryl for the deeper tissue as well as the subcutaneous tissue and a 4-0 nylon for the skin.  A bulky sterile dressing was applied.  Patient was then placed in a neutral position in an equalizer walker boot. ? ?  Patient tolerated the procedure and anesthesia well.  Was transported from the OR to the PACU with all vital signs stable and vascular status intact. To be discharged per routine protocol.  Will follow up in approximately 1 week in the outpatient clinic. ? ?

## 2021-12-24 NOTE — Anesthesia Postprocedure Evaluation (Signed)
Anesthesia Post Note ? ?Patient: Maria Hodge ? ?Procedure(s) Performed: OPEN REDUCTION INTERNAL FIXATION (ORIF) ANKLE FRACTURE (Right: Ankle) ? ? ?  ?Patient location during evaluation: PACU ?Anesthesia Type: General ?Level of consciousness: awake and alert ?Pain management: pain level controlled ?Vital Signs Assessment: post-procedure vital signs reviewed and stable ?Respiratory status: spontaneous breathing, nonlabored ventilation, respiratory function stable and patient connected to nasal cannula oxygen ?Cardiovascular status: blood pressure returned to baseline and stable ?Postop Assessment: no apparent nausea or vomiting ?Anesthetic complications: no ? ? ?No notable events documented. ? ?Trecia Rogers ? ? ? ? ? ?

## 2021-12-26 ENCOUNTER — Encounter: Payer: Self-pay | Admitting: Podiatry

## 2021-12-29 ENCOUNTER — Encounter: Payer: Self-pay | Admitting: Podiatry

## 2021-12-30 ENCOUNTER — Encounter: Payer: Self-pay | Admitting: Podiatry

## 2022-01-01 DIAGNOSIS — S82831A Other fracture of upper and lower end of right fibula, initial encounter for closed fracture: Secondary | ICD-10-CM | POA: Diagnosis not present

## 2022-03-05 ENCOUNTER — Telehealth: Payer: Self-pay | Admitting: *Deleted

## 2022-03-05 NOTE — Telephone Encounter (Signed)
Pt called requesting refill for clonezepam 0.5 mg send to [id number] - [place], [place] - [address]  [address], [place] [id number]. Sent private message to Dr. Toni Amend.

## 2022-03-10 ENCOUNTER — Telehealth (INDEPENDENT_AMBULATORY_CARE_PROVIDER_SITE_OTHER): Payer: Medicare HMO | Admitting: Psychiatry

## 2022-03-10 DIAGNOSIS — F411 Generalized anxiety disorder: Secondary | ICD-10-CM

## 2022-03-10 DIAGNOSIS — F3162 Bipolar disorder, current episode mixed, moderate: Secondary | ICD-10-CM | POA: Diagnosis not present

## 2022-03-10 MED ORDER — BUPROPION HCL ER (XL) 150 MG PO TB24: 150.0000 mg | ORAL_TABLET | Freq: Every day | ORAL | 5 refills | Status: DC

## 2022-03-10 MED ORDER — CLONAZEPAM 0.5 MG PO TABS: 0.5000 mg | ORAL_TABLET | Freq: Two times a day (BID) | ORAL | 5 refills | Status: DC

## 2022-03-10 MED ORDER — QUETIAPINE FUMARATE 400 MG PO TABS: 400.0000 mg | ORAL_TABLET | Freq: Two times a day (BID) | ORAL | 5 refills | Status: DC

## 2022-03-10 MED ORDER — AMITRIPTYLINE HCL 150 MG PO TABS: ORAL_TABLET | ORAL | 5 refills | Status: DC

## 2022-03-10 NOTE — Progress Notes (Signed)
Virtual Visit via Telephone Note  I connected with Maria Hodge on 03/10/22 at  1:40 PM EDT by telephone and verified that I am speaking with the correct person using two identifiers.  Location: Patient: Home Provider: Hospital   I discussed the limitations, risks, security and privacy concerns of performing an evaluation and management service by telephone and the availability of in person appointments. I also discussed with the patient that there may be a patient responsible charge related to this service. The patient expressed understanding and agreed to proceed.   History of Present Illness: Patient reached by telephone.  Have not spoken in several months.  Lots of stress in her life.  Recently had a bowel problem and still has not gotten it fixed.  Anxious a lot of the time.  Dysphoric.  No suicidal thoughts or psychosis.  Tolerating medicine well.    Observations/Objective: Alert and oriented.  Dysphoric but reactive affect.  No sign of loosening of associations delusions or disorganized thinking   Assessment and Plan: Review all medications.  No recommended change to current dosage.  Continue medicines for anxiety and depression.  No indication that I see of abuse.  Follow-up 6 months.   Follow Up Instructions: 6 months    I discussed the assessment and treatment plan with the patient. The patient was provided an opportunity to ask questions and all were answered. The patient agreed with the plan and demonstrated an understanding of the instructions.   The patient was advised to call back or seek an in-person evaluation if the symptoms worsen or if the condition fails to improve as anticipated.  I provided 20 minutes of non-face-to-face time during this encounter.   Mordecai Rasmussen, MD

## 2022-04-26 IMAGING — CT CT ABD-PELV W/ CM
2 of 5 series · 15 of 46 positions shown, 17 images · IV contrast (APPLIED)
Comparison: 05/16/2019

CLINICAL DATA: Left lower quadrant pain

EXAM:
CT ABDOMEN AND PELVIS WITH CONTRAST
TECHNIQUE: Multidetector CT imaging of the abdomen and pelvis was performed
using the standard protocol following bolus administration of
intravenous contrast.

[Series 2: abdomen 5.0 (person_name) · axial · 0.75mm/px · z∈[+814,+1234]mm · 12 of 98 slices shown, 14 images]
[im 7/98  soft-tissue]
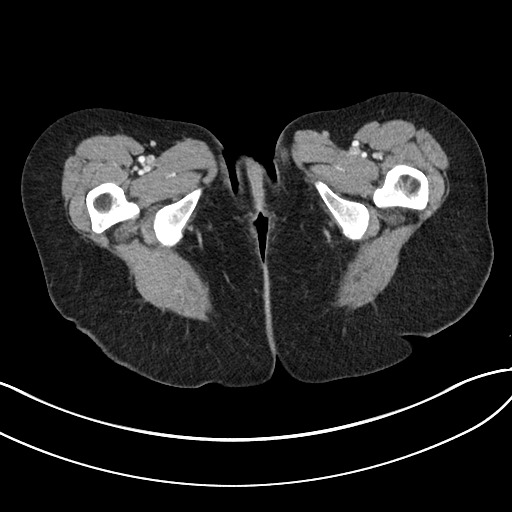
[im 7/98  bone]
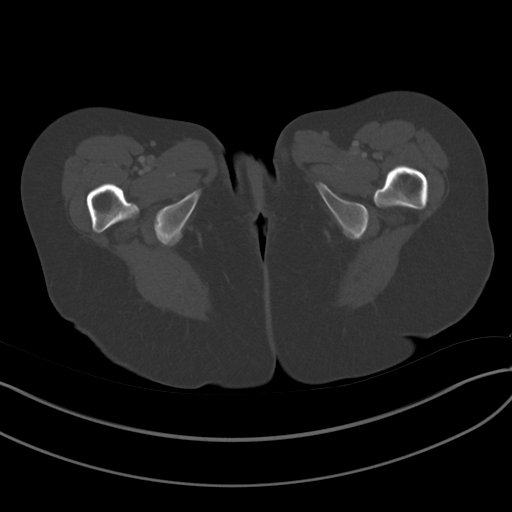
[im 14/98  soft-tissue]
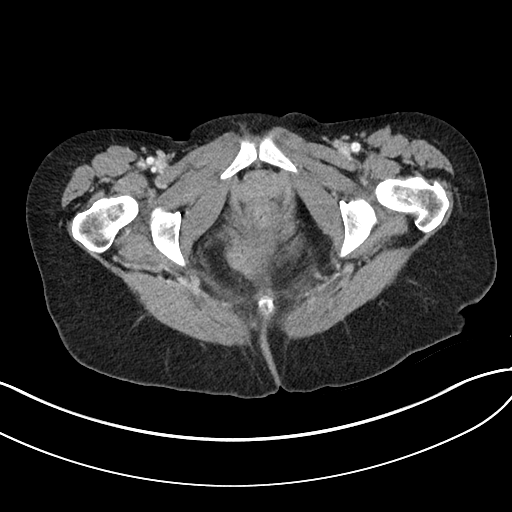
[im 21/98  soft-tissue]
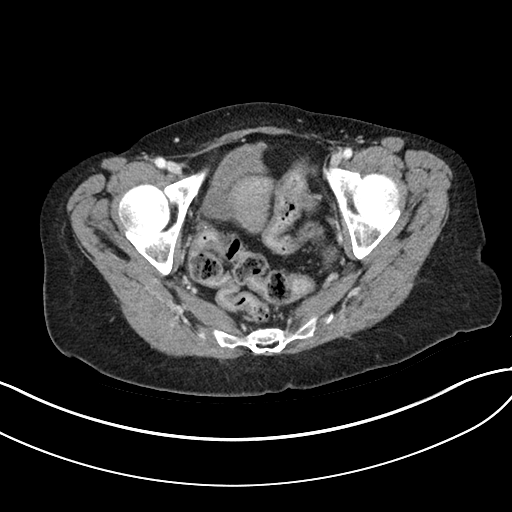
[im 28/98  soft-tissue]
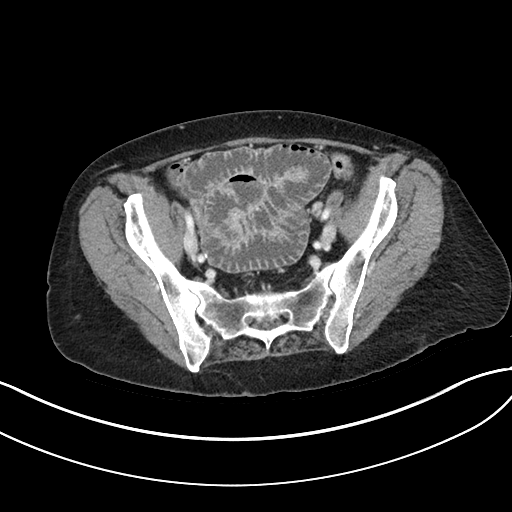
[im 35/98  soft-tissue]
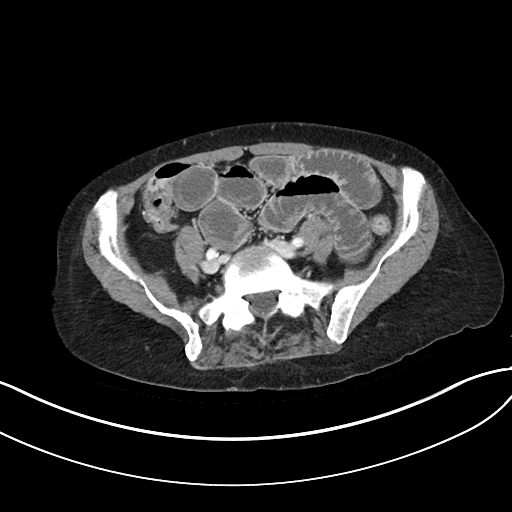
[im 42/98  soft-tissue]
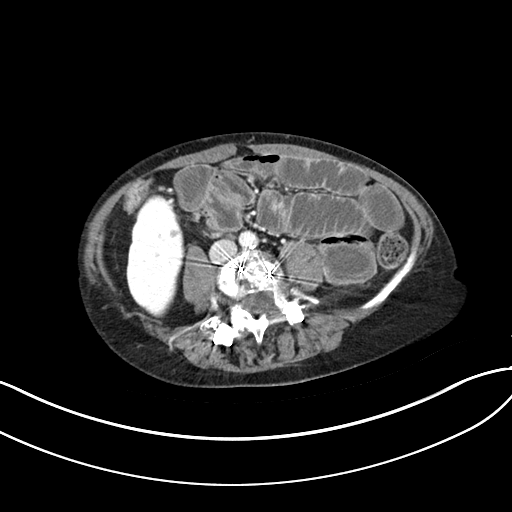
[im 56/98  soft-tissue]
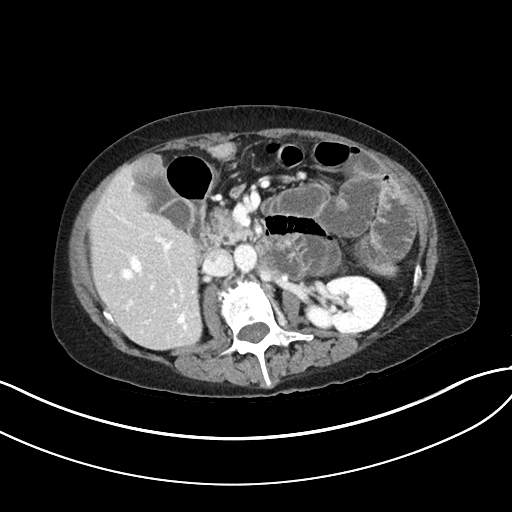
[im 63/98  soft-tissue]
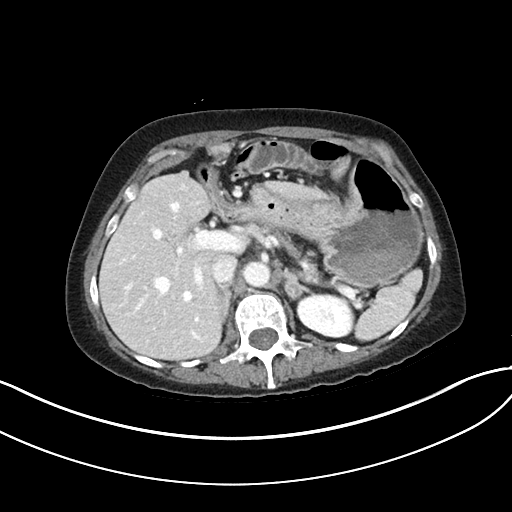
[im 70/98  soft-tissue]
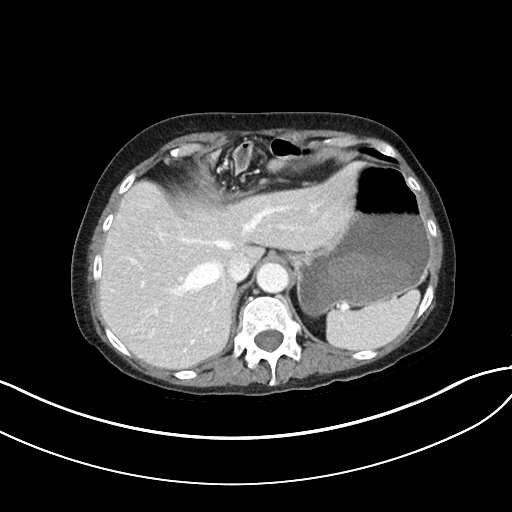
[im 70/98  bone]
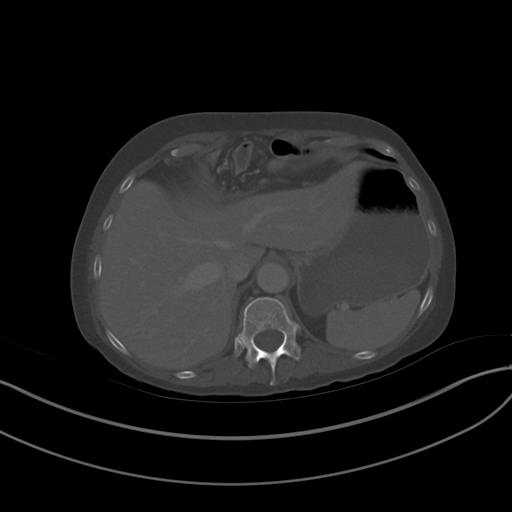
[im 77/98  soft-tissue]
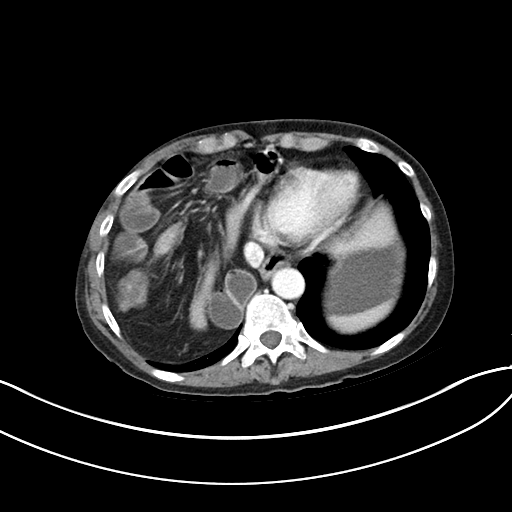
[im 84/98  soft-tissue]
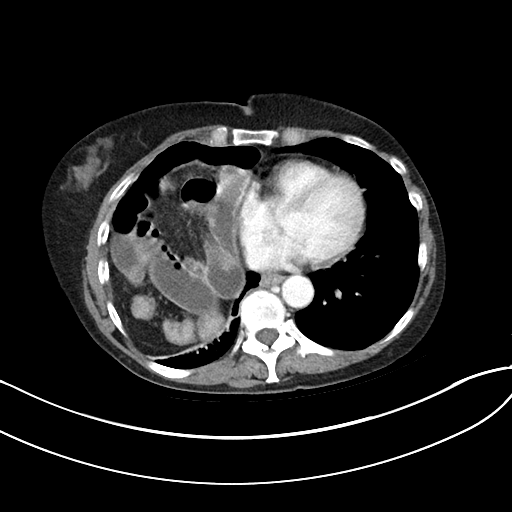
[im 91/98  soft-tissue]
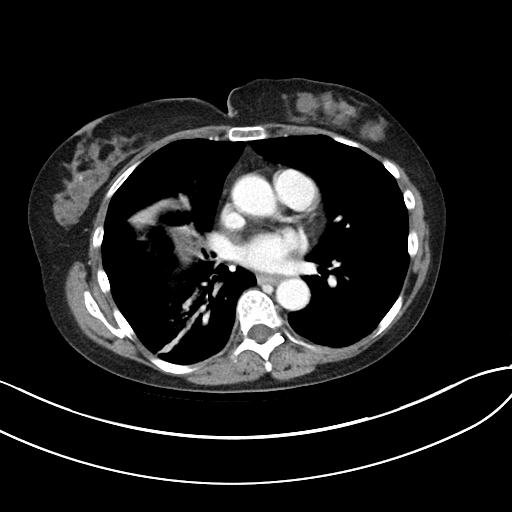

[Series 5: abdomen 3.0 mpr cor · coronal · 0.73mm/px · 3 of 82 slices shown]
[im 28/82  soft-tissue]
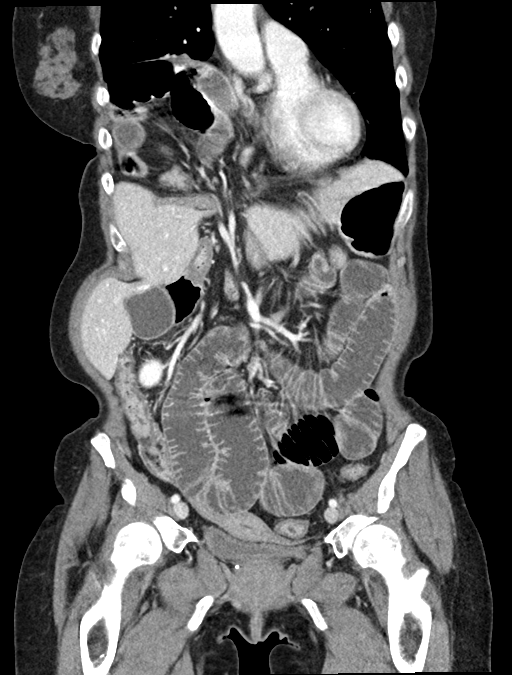
[im 37/82  soft-tissue]
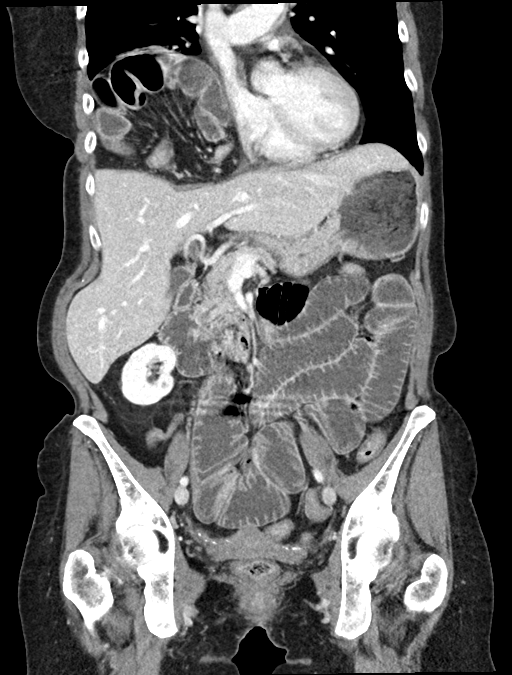
[im 46/82  soft-tissue]
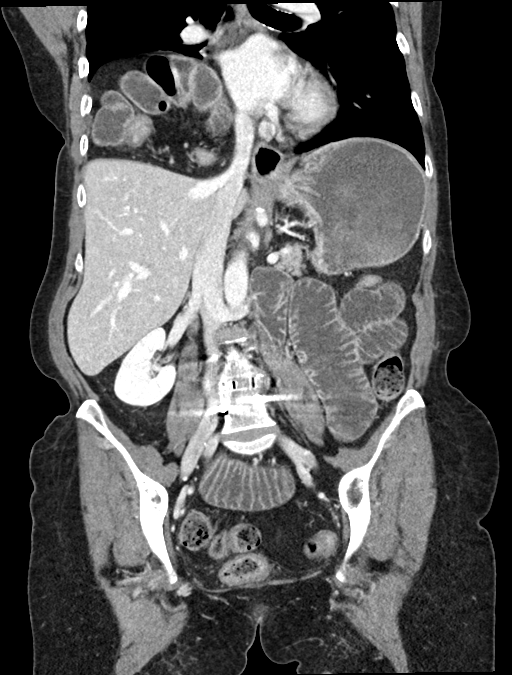

[15 of 46 positions shown; findings below may reference images not displayed]

RADIATION DOSE REDUCTION: This exam was performed according to the
departmental dose-optimization program which includes automated
exposure control, adjustment of the mA and/or kV according to
patient size and/or use of iterative reconstruction technique.

CONTRAST:  100mL OMNIPAQUE IOHEXOL 300 MG/ML  SOLN
FINDINGS: Lower chest: Linear scarring at the right lung base. Large anterior
diaphragmatic hernia containing large and small bowel loops. Several
of the small bowel loops are mildly dilated and fluid-filled. No
effusions.

Hepatobiliary: No focal hepatic abnormality. Gallbladder
unremarkable.

Pancreas: No focal abnormality or ductal dilatation.

Spleen: No focal abnormality.  Normal size.

Adrenals/Urinary Tract: Low-density nodule in the left adrenal gland
measures 13 mm, stable since prior study compatible with adenoma.
Right adrenal gland and kidneys unremarkable. Right kidney
malrotated. No hydronephrosis. Urinary bladder decompressed, grossly
unremarkable.

Stomach/Bowel: Dilated fluid-filled small bowel loops. Distal small
bowel loops are decompressed. Exact transition point is difficult to
visualize, but might be related to the large anterior diaphragmatic
hernia. Several of the small bowel loops within the right lower
chest/hernia are also dilated and fluid-filled.

Vascular/Lymphatic: No evidence of aneurysm or adenopathy.

Reproductive: Uterus and adnexa unremarkable.  No mass.

Other: No free fluid or free air.

Musculoskeletal: No acute bony abnormality.
IMPRESSION: Findings compatible with distal small bowel obstruction. There is a
large anterior diaphragmatic hernia which contains large bowel and
multiple small bowel loops. Several of these small bowel loops are
also dilated and fluid-filled within the hernia. It is difficult to
detect the exact transition/cause, but may be related to the
diaphragmatic hernia.

## 2022-04-27 IMAGING — DX DG ABDOMEN 1V
1 series · 1 of 1 positions shown · non-contrast
Comparison: KUB, 06/07/2018.  CT AP, 11/13/2017.

CLINICAL DATA: NG tube placement.

EXAM:
ABDOMEN - 1 VIEW

[abdomen supine]
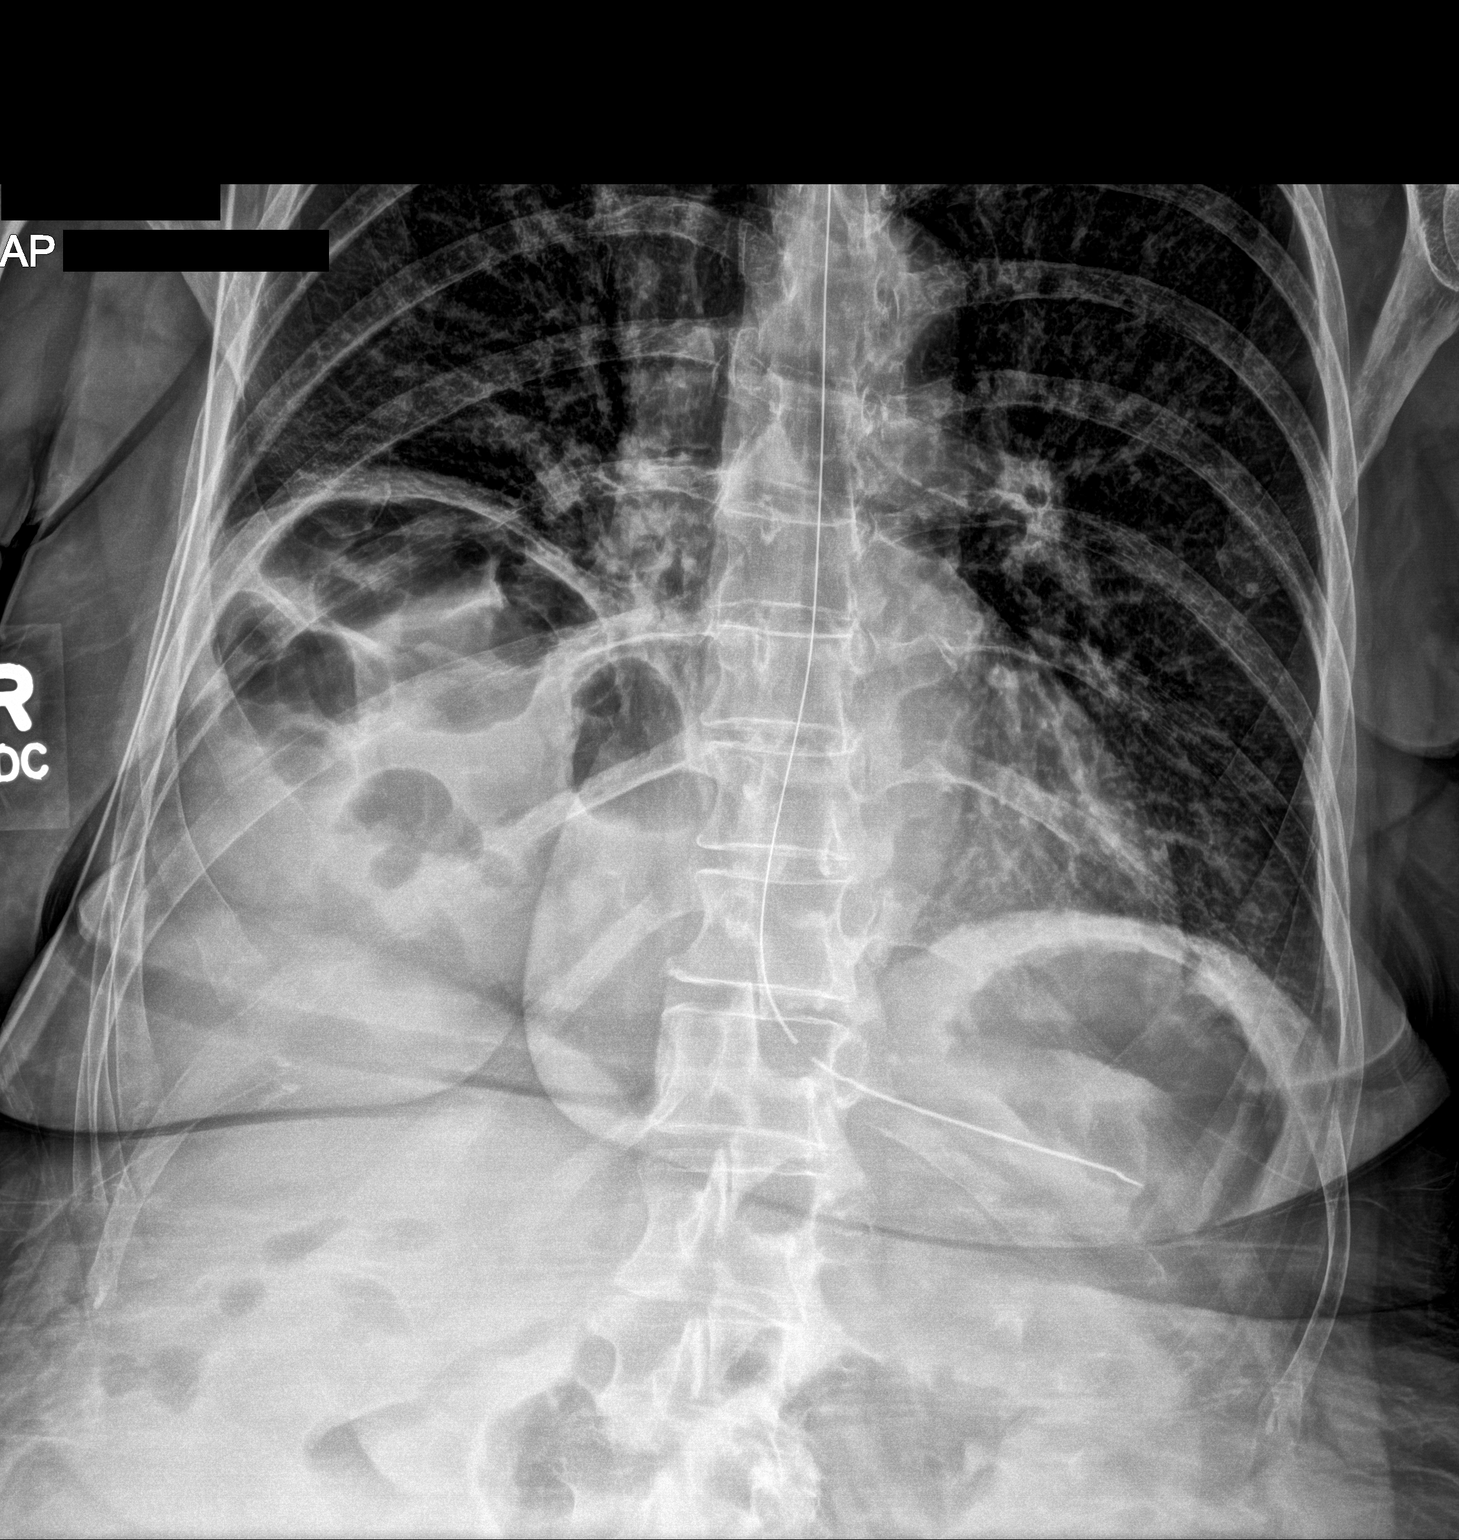

[1 of 1 positions shown; findings below may reference images not displayed]

FINDINGS: Enteric feeding tube with tip within stomach, however the side port
appears positioned at the GE junction.

Diaphragmatic hernia with bowel interposition RIGHT upper quadrant.
IMPRESSION: 1. Enteric feeding tube with tip within stomach, however the side
port appears positioned at the GE junction.
Consider advancement by 5 cm for appropriate placement.
2. Diaphragmatic hernia with bowel interposition RIGHT upper
quadrant.

## 2022-07-30 DIAGNOSIS — H524 Presbyopia: Secondary | ICD-10-CM | POA: Diagnosis not present

## 2022-07-30 DIAGNOSIS — H5203 Hypermetropia, bilateral: Secondary | ICD-10-CM | POA: Diagnosis not present

## 2022-08-14 ENCOUNTER — Emergency Department
Admission: EM | Admit: 2022-08-14 | Discharge: 2022-08-15 | Disposition: A | Discharge status: Other Acute Inpatient Hospital | Payer: Medicare HMO | Attending: Emergency Medicine | Admitting: Emergency Medicine

## 2022-08-14 DIAGNOSIS — R0602 Shortness of breath: Secondary | ICD-10-CM | POA: Diagnosis not present

## 2022-08-14 DIAGNOSIS — K311 Adult hypertrophic pyloric stenosis: Secondary | ICD-10-CM

## 2022-08-14 DIAGNOSIS — K6389 Other specified diseases of intestine: Secondary | ICD-10-CM | POA: Diagnosis not present

## 2022-08-14 DIAGNOSIS — I10 Essential (primary) hypertension: Secondary | ICD-10-CM | POA: Insufficient documentation

## 2022-08-14 DIAGNOSIS — R000 Tachycardia, unspecified: Secondary | ICD-10-CM | POA: Diagnosis not present

## 2022-08-14 DIAGNOSIS — R0902 Hypoxemia: Secondary | ICD-10-CM | POA: Diagnosis not present

## 2022-08-14 DIAGNOSIS — J9601 Acute respiratory failure with hypoxia: Secondary | ICD-10-CM | POA: Diagnosis not present

## 2022-08-14 DIAGNOSIS — K440 Diaphragmatic hernia with obstruction, without gangrene: Secondary | ICD-10-CM | POA: Insufficient documentation

## 2022-08-14 DIAGNOSIS — J811 Chronic pulmonary edema: Secondary | ICD-10-CM | POA: Diagnosis not present

## 2022-08-14 DIAGNOSIS — Z20822 Contact with and (suspected) exposure to covid-19: Secondary | ICD-10-CM | POA: Insufficient documentation

## 2022-08-14 DIAGNOSIS — R1032 Left lower quadrant pain: Secondary | ICD-10-CM | POA: Diagnosis not present

## 2022-08-14 LAB — CBC
HCT: 52.5 % — ABNORMAL HIGH (ref 36.0–46.0)
Hemoglobin: 17.1 g/dL — ABNORMAL HIGH (ref 12.0–15.0)
MCH: 28.9 pg (ref 26.0–34.0)
MCHC: 32.6 g/dL (ref 30.0–36.0)
MCV: 88.8 fL (ref 80.0–100.0)
Platelets: 218 10*3/uL (ref 150–400)
RBC: 5.91 MIL/uL — ABNORMAL HIGH (ref 3.87–5.11)
RDW: 16.4 % — ABNORMAL HIGH (ref 11.5–15.5)
WBC: 17 10*3/uL — ABNORMAL HIGH (ref 4.0–10.5)
nRBC: 0 % (ref 0.0–0.2)

## 2022-08-14 LAB — COMPREHENSIVE METABOLIC PANEL
ALT: 13 U/L (ref 0–44)
AST: 17 U/L (ref 15–41)
Albumin: 4.3 g/dL (ref 3.5–5.0)
Alkaline Phosphatase: 74 U/L (ref 38–126)
Anion gap: 11 (ref 5–15)
BUN: 10 mg/dL (ref 6–20)
CO2: 29 mmol/L (ref 22–32)
Calcium: 9.9 mg/dL (ref 8.9–10.3)
Chloride: 102 mmol/L (ref 98–111)
Creatinine, Ser: 0.85 mg/dL (ref 0.44–1.00)
GFR, Estimated: >60 mL/min (ref >60)
Glucose, Bld: 139 mg/dL — ABNORMAL HIGH (ref 70–99)
Potassium: 3.6 mmol/L (ref 3.5–5.1)
Sodium: 142 mmol/L (ref 135–145)
Total Bilirubin: 0.7 mg/dL (ref 0.3–1.2)
Total Protein: 8.4 g/dL — ABNORMAL HIGH (ref 6.5–8.1)

## 2022-08-14 LAB — LIPASE, BLOOD: Lipase: 40 U/L (ref 11–51)

## 2022-08-14 LAB — RESP PANEL BY RT-PCR (RSV, FLU A&B, COVID)  RVPGX2
Influenza A by PCR: NEGATIVE
Influenza B by PCR: NEGATIVE
Resp Syncytial Virus by PCR: NEGATIVE
SARS Coronavirus 2 by RT PCR: NEGATIVE

## 2022-08-14 MED ORDER — ONDANSETRON 4 MG PO TBDP
ORAL_TABLET | ORAL | Status: AC
  Filled 2022-08-14: qty 1

## 2022-08-14 MED ORDER — ONDANSETRON 4 MG PO TBDP
4.0000 mg | ORAL_TABLET | Freq: Once | ORAL | Status: AC | PRN
  Administered 2022-08-14: 4 mg via ORAL
  Filled 2022-08-14: qty 1

## 2022-08-14 MED ORDER — MORPHINE SULFATE (PF) 4 MG/ML IV SOLN
4.0000 mg | Freq: Once | INTRAVENOUS | Status: AC
Start: 2022-08-14 — End: 2022-08-14
  Administered 2022-08-14: 4 mg via INTRAVENOUS
  Filled 2022-08-14: qty 1

## 2022-08-14 MED ORDER — SODIUM CHLORIDE 0.9 % IV BOLUS (SEPSIS)
1000.0000 mL | Freq: Once | INTRAVENOUS | Status: AC
  Administered 2022-08-14: 1000 mL via INTRAVENOUS

## 2022-08-14 MED ORDER — ONDANSETRON HCL 4 MG/2ML IJ SOLN
4.0000 mg | Freq: Once | INTRAMUSCULAR | Status: AC
  Administered 2022-08-14: 4 mg via INTRAVENOUS
  Filled 2022-08-14: qty 2

## 2022-08-14 NOTE — ED Provider Notes (Incomplete)
[facility] Provider Note    Event Date/Time   First MD Initiated Contact with Patient 08/14/22 2302     (approximate)   History   Nausea and Emesis   HPI  Maria Hodge is a 61 y.o. female with history of hypertension, small bowel obstruction who presents to the emergency department with complaints of chills, nausea, vomiting, left lower quadrant abdominal pain.  No diarrhea, dysuria, hematuria, vaginal bleeding or discharge.  She denies previous abdominal surgeries.  It appears she was admitted to the hospital in March 2023 for what she states was similar complaints and found to have a large anterior diaphragmatic hernia causing a bowel obstruction.  Patient ended up leaving AGAINST MEDICAL ADVICE.  She states symptoms feel similar today.   History provided by patient and husband.    Past Medical History:  Diagnosis Date  . Anxiety   . Arrhythmia   . Arthritis    hands  . Depression   . GERD (gastroesophageal reflux disease)   . Heart murmur   . Hypertension   . Wears dentures    full upper    Past Surgical History:  Procedure Laterality Date  . BACK SURGERY  2010  . ORIF ANKLE FRACTURE Left 11/09/2017   Procedure: OPEN REDUCTION INTERNAL FIXATION (ORIF) ANKLE FRACTURE WITH POSSIBLE SYNDESMOSIS REPAIR;  Surgeon: Signa Kell, MD;  Location: [facility];  Service: Orthopedics;  Laterality: Left;  GENERAL WITH NERVE BLOCK ALEX SYNDEAMOSIS TIGHTROPE MARK ANKLE FRACTURE PLATES  C ARM  PLASTER SPLINT  . ORIF ANKLE FRACTURE Right 12/24/2021   Procedure: OPEN REDUCTION INTERNAL FIXATION (ORIF) ANKLE FRACTURE;  Surgeon: Gwyneth Revels, DPM;  Location: [facility];  Service: Podiatry;  Laterality: Right;  Anesthesia: General with pop liteal  . STERIOD INJECTION  12/21/2011   Procedure: MINOR STEROID INJECTION;  Surgeon: Mat Carne, MD;  Location: [facility];  Service: Orthopedics;  Laterality: Right;  Right L3-4  transforaminal epidural steroid injection, attempted right L4-5 epidural steroid injection( 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)    MEDICATIONS:  Prior to Admission medications   Medication Sig Start Date End Date Taking? Authorizing Provider  acetaminophen (TYLENOL) 500 MG tablet Take 500 mg by mouth every 6 (six) hours as needed.    [provider]  amitriptyline (ELAVIL) 150 MG tablet TAKE 1 TABLET BY MOUTH EVERYDAY AT BEDTIME 03/10/22   Clapacs, Jackquline Denmark, MD  buPROPion (WELLBUTRIN XL) 150 MG 24 hr tablet Take 1 tablet (150 mg total) by mouth daily. 03/10/22   Clapacs, Jackquline Denmark, MD  clonazePAM (KLONOPIN) 0.5 MG tablet Take 1 tablet (0.5 mg total) by mouth 2 (two) times daily. 08/12/21 08/12/22  Clapacs, Jackquline Denmark, MD  clonazePAM (KLONOPIN) 0.5 MG tablet Take 1 tablet (0.5 mg total) by mouth 2 (two) times daily. 03/10/22   Clapacs, Jackquline Denmark, MD  esomeprazole (NEXIUM) 20 MG capsule Take 20 mg by mouth daily.    [provider]  oxyCODONE-acetaminophen (PERCOCET) 5-325 MG tablet Take 1-2 tablets by mouth every 6 (six) hours as needed for severe pain. Max 6 tabs per day 12/24/21   Gwyneth Revels, DPM  oxyCODONE-acetaminophen (PERCOCET/ROXICET) 5-325 MG tablet Take by mouth.    [provider]  QUEtiapine (SEROQUEL) 400 MG tablet TAKE 1 TABLET BY MOUTH TWICE A DAY 07/25/20   Clapacs, Jackquline Denmark, MD  QUEtiapine (SEROQUEL) 400 MG tablet TAKE 1 TABLET BY MOUTH TWICE A DAY 03/24/21   Clapacs, Jackquline Denmark, MD  QUEtiapine (SEROQUEL) 400 MG tablet Take 1 tablet (400 mg total) by mouth 2 (two) times daily. 07/30/20   Clapacs, Jackquline Denmark, MD  QUEtiapine (SEROQUEL) 400 MG tablet Take 1 tablet (400 mg total) by mouth 2 (two) times daily. 03/10/22   Clapacs, Jackquline Denmark, MD    Physical Exam   Triage Vital Signs: ED Triage Vitals  Enc Vitals Group     BP 08/14/22 2114 (!) 136/102     Pulse Rate 08/14/22 2114 (!) 117     Resp 08/14/22 2114 20     Temp 08/14/22 2114 99 F (37.2 C)     Temp Source 08/14/22  2114 Oral     SpO2 08/14/22 2114 91 %     Weight 08/14/22 2118 130 lb (59 kg)     Height 08/14/22 2118 5\' 5"  (1.651 m)     Head Circumference --      Peak Flow --      Pain Score 08/14/22 2117 7     Pain Loc --      Pain Edu? --      Excl. in GC? --     Most recent vital signs: Vitals:   08/14/22 2119 08/14/22 2333  BP:  (!) 171/94  Pulse: (!) 106 84  Resp:  19  Temp:  98.3 F (36.8 C)  SpO2: 92% 93%    CONSTITUTIONAL: Alert and oriented and responds appropriately to questions. Well-appearing; well-nourished HEAD: Normocephalic, atraumatic EYES: Conjunctivae clear, pupils appear equal, sclera nonicteric ENT: normal nose; moist mucous membranes NECK: Supple, normal ROM CARD: RRR; S1 and S2 appreciated; no murmurs, no clicks, no rubs, no gallops RESP: Normal chest excursion without splinting or tachypnea; breath sounds clear and equal bilaterally; no wheezes, no rhonchi, no rales, no hypoxia or respiratory distress, speaking full sentences ABD/GI: Normal bowel sounds; non-distended; soft, non-tender, no rebound, no guarding, no peritoneal signs BACK: The back appears normal EXT: Normal ROM in all joints; no deformity noted, no edema; no cyanosis SKIN: Normal color for age and race; warm; no rash on exposed skin NEURO: Moves all extremities equally, normal speech PSYCH: The patient's mood and manner are appropriate.   ED Results / Procedures / Treatments   LABS: (all labs ordered are listed, but only abnormal results are displayed) Labs Reviewed  COMPREHENSIVE METABOLIC PANEL - Abnormal; Notable for the following components:      Result Value   Glucose, Bld 139 (*)    Total Protein 8.4 (*)    All other components within normal limits  CBC - Abnormal; Notable for the following components:   WBC 17.0 (*)    RBC 5.91 (*)    Hemoglobin 17.1 (*)    HCT 52.5 (*)    RDW 16.4 (*)    All other components within normal limits  RESP PANEL BY RT-PCR (RSV, FLU A&B, COVID)   [id number]  LIPASE, BLOOD  URINALYSIS, ROUTINE W REFLEX MICROSCOPIC     EKG:   RADIOLOGY: My personal review and interpretation of imaging:  ***  I have personally reviewed all radiology reports.   No results found.   PROCEDURES:  Critical Care performed: {CriticalCareYesNo:19197::"Yes, see critical care procedure note(s)","No"}   CRITICAL CARE Performed by: 08/16/22   Total critical care time: *** minutes  Critical care time was exclusive of separately billable procedures and treating other patients.  Critical care was necessary to treat or prevent imminent or life-threatening deterioration.  Critical care was time spent personally by me on the  following activities: development of treatment plan with patient and/or surrogate as well as nursing, discussions with consultants, evaluation of patient's response to treatment, examination of patient, obtaining history from patient or surrogate, ordering and performing treatments and interventions, ordering and review of laboratory studies, ordering and review of radiographic studies, pulse oximetry and re-evaluation of patient's condition.   Procedures    IMPRESSION / MDM / ASSESSMENT AND PLAN / ED COURSE  I reviewed the triage vital signs and the nursing notes.    ***  The patient is on the cardiac monitor to evaluate for evidence of arrhythmia and/or significant heart rate changes.   DIFFERENTIAL DIAGNOSIS (includes but not limited to):   ***   Patient's presentation is most consistent with {EM COPA:27473}   PLAN: ***   MEDICATIONS GIVEN IN ED: Medications  sodium chloride 0.9 % bolus 1,000 mL (1,000 mLs Intravenous New Bag/Given 08/14/22 2358)  ondansetron (ZOFRAN-ODT) disintegrating tablet 4 mg (4 mg Oral Given 08/14/22 2124)  morphine (PF) 4 MG/ML injection 4 mg (4 mg Intravenous Given 08/14/22 2359)  ondansetron (ZOFRAN) injection 4 mg (4 mg Intravenous Given 08/14/22 2358)     ED COURSE:   ***   CONSULTS:  ***   OUTSIDE RECORDS REVIEWED:  ***       FINAL CLINICAL IMPRESSION(S) / ED DIAGNOSES   Final diagnoses:  None     Rx / DC Orders   ED Discharge Orders     None        Note:  This document was prepared using Dragon voice recognition software and may include unintentional dictation errors.

## 2022-08-14 NOTE — ED Provider Notes (Incomplete)
[facility] Provider Note    Event Date/Time   First MD Initiated Contact with Patient 08/14/22 2302     (approximate)   History   Nausea and Emesis   HPI  Sinclair GroomsCecilia M Hodge is a 61 y.o. female with history of hypertension, small bowel obstruction who presents to the emergency department with complaints of chills, nausea, vomiting, left lower quadrant abdominal pain.  No diarrhea, dysuria, hematuria, vaginal bleeding or discharge.  She denies previous abdominal surgeries.  It appears she was admitted to the hospital in March 2023 for what she states was similar complaints and found to have a large anterior diaphragmatic hernia causing a bowel obstruction.  Patient ended up leaving AGAINST MEDICAL ADVICE.  She states symptoms feel similar today.   History provided by patient and husband.    Past Medical History:  Diagnosis Date   Anxiety    Arrhythmia    Arthritis    hands   Depression    GERD (gastroesophageal reflux disease)    Heart murmur    Hypertension    Wears dentures    full upper    Past Surgical History:  Procedure Laterality Date   BACK SURGERY  2010   ORIF ANKLE FRACTURE Left 11/09/2017   Procedure: OPEN REDUCTION INTERNAL FIXATION (ORIF) ANKLE FRACTURE WITH POSSIBLE SYNDESMOSIS REPAIR;  Surgeon: Signa KellPatel, Sunny, MD;  [facility];  Service: Orthopedics;  Laterality: Left;  GENERAL WITH NERVE BLOCK ALEX SYNDEAMOSIS TIGHTROPE MARK ANKLE FRACTURE PLATES  C ARM  PLASTER SPLINT   ORIF ANKLE FRACTURE Right 12/24/2021   Procedure: OPEN REDUCTION INTERNAL FIXATION (ORIF) ANKLE FRACTURE;  Surgeon: Gwyneth RevelsFowler, Justin, DPM;  [facility];  Service: Podiatry;  Laterality: Right;  Anesthesia: General with pop liteal   STERIOD INJECTION  12/21/2011   Procedure: MINOR STEROID INJECTION;  Surgeon: Mat CarneSydney Michael Tooke, MD;  Location: [facility];  Service: Orthopedics;  Laterality: Right;  Right L3-4  transforaminal epidural steroid injection, attempted right L4-5 epidural steroid injection( 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)    MEDICATIONS:  Prior to Admission medications   Medication Sig Start Date End Date Taking? Authorizing Provider  acetaminophen (TYLENOL) 500 MG tablet Take 500 mg by mouth every 6 (six) hours as needed.    [provider]  amitriptyline (ELAVIL) 150 MG tablet TAKE 1 TABLET BY MOUTH EVERYDAY AT BEDTIME 03/10/22   Clapacs, Jackquline DenmarkJohn T, MD  buPROPion (WELLBUTRIN XL) 150 MG 24 hr tablet Take 1 tablet (150 mg total) by mouth daily. 03/10/22   Clapacs, Jackquline DenmarkJohn T, MD  clonazePAM (KLONOPIN) 0.5 MG tablet Take 1 tablet (0.5 mg total) by mouth 2 (two) times daily. 08/12/21 08/12/22  Clapacs, Jackquline DenmarkJohn T, MD  clonazePAM (KLONOPIN) 0.5 MG tablet Take 1 tablet (0.5 mg total) by mouth 2 (two) times daily. 03/10/22   Clapacs, Jackquline DenmarkJohn T, MD  esomeprazole (NEXIUM) 20 MG capsule Take 20 mg by mouth daily.    [provider]  oxyCODONE-acetaminophen (PERCOCET) 5-325 MG tablet Take 1-2 tablets by mouth every 6 (six) hours as needed for severe pain. Max 6 tabs per day 12/24/21   Gwyneth RevelsFowler, Justin, DPM  oxyCODONE-acetaminophen (PERCOCET/ROXICET) 5-325 MG tablet Take by mouth.    [provider]  QUEtiapine (SEROQUEL) 400 MG tablet TAKE 1 TABLET BY MOUTH TWICE A DAY 07/25/20   Clapacs, Jackquline DenmarkJohn T, MD  QUEtiapine (SEROQUEL) 400 MG tablet TAKE 1 TABLET BY MOUTH TWICE A DAY 03/24/21   Clapacs, Jackquline DenmarkJohn T, MD  QUEtiapine (SEROQUEL) 400 MG tablet Take 1 tablet (400 mg total) by mouth 2 (two) times daily. 07/30/20   Clapacs, Jackquline Denmark, MD  QUEtiapine (SEROQUEL) 400 MG tablet Take 1 tablet (400 mg total) by mouth 2 (two) times daily. 03/10/22   Clapacs, Jackquline Denmark, MD    Physical Exam   Triage Vital Signs: ED Triage Vitals  Enc Vitals Group     BP 08/14/22 2114 (!) 136/102     Pulse Rate 08/14/22 2114 (!) 117     Resp 08/14/22 2114 20     Temp 08/14/22 2114 99 F (37.2 C)     Temp Source 08/14/22  2114 Oral     SpO2 08/14/22 2114 91 %     Weight 08/14/22 2118 130 lb (59 kg)     Height 08/14/22 2118 5\' 5"  (1.651 m)     Head Circumference --      Peak Flow --      Pain Score 08/14/22 2117 7     Pain Loc --      Pain Edu? --      Excl. in [place]? --     Most recent vital signs: Vitals:   08/15/22 0300 08/15/22 0400  BP: (!) 134/93 (!) 152/92  Pulse: (!) 114 (!) 109  Resp: (!) 22 (!) 22  Temp: 98 F (36.7 C)   SpO2: 97% 97%    CONSTITUTIONAL: Alert and oriented and responds appropriately to questions. Well-appearing; well-nourished HEAD: Normocephalic, atraumatic EYES: Conjunctivae clear, pupils appear equal, sclera nonicteric ENT: normal nose; moist mucous membranes NECK: Supple, normal ROM CARD: RRR; S1 and S2 appreciated; no murmurs, no clicks, no rubs, no gallops RESP: Normal chest excursion without splinting or tachypnea; breath sounds clear and equal bilaterally; no wheezes, no rhonchi, no rales, no hypoxia or respiratory distress, speaking full sentences ABD/GI: Normal bowel sounds; non-distended; soft, non-tender, no rebound, no guarding, no peritoneal signs BACK: The back appears normal EXT: Normal ROM in all joints; no deformity noted, no edema; no cyanosis SKIN: Normal color for age and race; warm; no rash on exposed skin NEURO: Moves all extremities equally, normal speech PSYCH: The patient's mood and manner are appropriate.   ED Results / Procedures / Treatments   LABS: (all labs ordered are listed, but only abnormal results are displayed) Labs Reviewed  COMPREHENSIVE METABOLIC PANEL - Abnormal; Notable for the following components:      Result Value   Glucose, Bld 139 (*)    Total Protein 8.4 (*)    All other components within normal limits  CBC - Abnormal; Notable for the following components:   WBC 17.0 (*)    RBC 5.91 (*)    Hemoglobin 17.1 (*)    HCT 52.5 (*)    RDW 16.4 (*)    All other components within normal limits  RESP PANEL BY RT-PCR  (RSV, FLU A&B, COVID)  RVPGX2  LIPASE, BLOOD  URINALYSIS, ROUTINE W REFLEX MICROSCOPIC     EKG:   RADIOLOGY: My personal review and interpretation of imaging: Chest x-ray shows elevated right hemidiaphragm.  CT abdomen pelvis shows large right-sided diaphragmatic hernia and gastric outlet obstruction.  I have personally reviewed all radiology reports.   DG Chest Portable 1 View  Result Date: 08/15/2022 CLINICAL DATA:  Shortness of breath and hypoxia EXAM: PORTABLE CHEST 1 VIEW COMPARISON:  06/07/2018 FINDINGS: Borderline cardiomegaly. Elevated right hemidiaphragm increased from 2019. Pulmonary vascular congestion. Mild interstitial coarsening. No focal consolidation, pleural effusion, or pneumothorax. IMPRESSION: 1.  Borderline cardiomegaly and pulmonary vascular congestion. 2. Elevated right hemidiaphragm. Electronically Signed   By: Minerva Fester M.D.   On: 08/15/2022 01:48   CT ABDOMEN PELVIS W CONTRAST  Result Date: 08/15/2022 CLINICAL DATA:  Left lower quadrant pain EXAM: CT ABDOMEN AND PELVIS WITH CONTRAST TECHNIQUE: Multidetector CT imaging of the abdomen and pelvis was performed using the standard protocol following bolus administration of intravenous contrast. RADIATION DOSE REDUCTION: This exam was performed according to the departmental dose-optimization program which includes automated exposure control, adjustment of the mA and/or kV according to patient size and/or use of iterative reconstruction technique. CONTRAST:  OMNIPAQUE IOHEXOL 300 MG/ML  SOLN COMPARISON:  CT abdomen and pelvis 11/13/2021 FINDINGS: Lower chest: There is atelectasis in the right lung base. Hepatobiliary: No focal liver abnormality is seen. No gallstones, gallbladder wall thickening, or biliary dilatation. Pancreas: Unremarkable. No pancreatic ductal dilatation or surrounding inflammatory changes. Spleen: Normal in size without focal abnormality. Adrenals/Urinary Tract: There stable left adrenal nodules  measuring up to 1 cm, compatible with adenoma. Right adrenal gland is within normal limits. 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 There is a subcentimeter cortical cyst in the right kidney, unchanged. There is no hydronephrosis or perinephric fluid. Bladder is within normal limits. Stomach/Bowel: There is a large air-fluid level in the stomach with distal esophageal wall thickening. Appendix appears normal. No evidence of bowel wall thickening, distention, or inflammatory changes. Vascular/Lymphatic: No significant vascular findings are present. No enlarged abdominal or pelvic lymph nodes. Reproductive: Uterus and bilateral adnexa are unremarkable. Other: Large right diaphragmatic hernia is again seen containing nondilated bowel. There is no ascites or free air. Musculoskeletal: L4-L5 posterior fusion hardware is present. Degenerative changes affect the spine. There is stable mild curvature of the lumbar spine. IMPRESSION: 1. Large air-fluid level in the stomach. Please correlate clinically for gastroparesis or gastric outlet obstruction. 2. Wall thickening of the distal esophagus worrisome for esophagitis. 3. Stable large right diaphragmatic hernia containing nondilated bowel. 4. Other chronic findings are stable. Electronically Signed   By: Darliss Cheney M.D.   On: 08/15/2022 00:50     PROCEDURES:  Critical Care performed: Yes, see critical care procedure note(s)   CRITICAL CARE Performed by: Baxter Hire Dai Mcadams   Total critical care time: 45 minutes  Critical care time was exclusive of separately billable procedures and treating other patients.  Critical care was necessary to treat or prevent imminent or life-threatening deterioration.  Critical care was time spent personally by me on the following activities: development of treatment plan with patient and/or surrogate as well as nursing, discussions with consultants, evaluation of patient's response to treatment, examination of  patient, obtaining history from patient or surrogate, ordering and performing treatments and interventions, ordering and review of laboratory studies, ordering and review of radiographic studies, pulse oximetry and re-evaluation of patient's condition.   Marland Kitchen1-3 Lead EKG Interpretation  Performed by: Ingeborg Fite, Layla Maw, DO Authorized by: Yoshito Gaza, Layla Maw, DO     Interpretation: abnormal     ECG rate:  114   ECG rate assessment: tachycardic     Rhythm: sinus tachycardia     Ectopy: none     Conduction: normal       IMPRESSION / MDM / ASSESSMENT AND PLAN / ED COURSE  I reviewed the triage vital signs and the nursing notes.    Patient here with left lower quadrant abdominal pain, chills, vomiting.  History of previous bowel obstruction.  States this feels similar.  The patient  is on the cardiac monitor to evaluate for evidence of arrhythmia and/or significant heart rate changes.   DIFFERENTIAL DIAGNOSIS (includes but not limited to):   Bowel obstruction, colitis, diverticulitis, viral gastroenteritis, kidney stone, UTI, pyelonephritis   Patient's presentation is most consistent with acute presentation with potential threat to life or bodily function.   PLAN: Will obtain CBC, CMP, lipase, urinalysis, CT of the abdomen pelvis.  Will give IV fluids, pain and nausea medicine.   MEDICATIONS GIVEN IN ED: Medications  0.9 %  sodium chloride infusion ( Intravenous New Bag/Given 08/15/22 0246)  HYDROmorphone (DILAUDID) injection 1 mg (has no administration in time range)  ondansetron (ZOFRAN) injection 4 mg (has no administration in time range)  ondansetron (ZOFRAN-ODT) disintegrating tablet 4 mg (4 mg Oral Given 08/14/22 2124)  sodium chloride 0.9 % bolus 1,000 mL (0 mLs Intravenous Stopped 08/15/22 0028)  morphine (PF) 4 MG/ML injection 4 mg (4 mg Intravenous Given 08/14/22 2359)  ondansetron (ZOFRAN) injection 4 mg (4 mg Intravenous Given 08/14/22 2358)  iohexol (OMNIPAQUE) 300 MG/ML  solution 100 mL (100 mLs Intravenous Contrast Given 08/15/22 0034)  HYDROmorphone (DILAUDID) injection 1 mg (1 mg Intravenous Given 08/15/22 0101)  pantoprazole (PROTONIX) injection 40 mg (40 mg Intravenous Given 08/15/22 0110)  metoCLOPramide (REGLAN) injection 10 mg (10 mg Intravenous Given 08/15/22 0110)  LORazepam (ATIVAN) injection 1 mg (1 mg Intravenous Given 08/15/22 0427)     ED COURSE: Patient's labs show leukocytosis of 17,000.  Normal electrolytes, renal function, LFTs and lipase.  CT scan reviewed and interpreted by myself and the radiologist and shows gastric outlet obstruction secondary to large right diaphragmatic hernia containing most of the small bowel from the right upper quadrant.  There is no bowel obstruction today.  Will place NG tube.  Continues to complain of pain and nausea and vomiting.  She states she is having bowel movements and passing gas.  Will discuss with our general surgeon as I feel she will need admission.   CONSULTS: Discussed with Dr. Ernesto Rutherford with general surgery.  We have discussed her case at length and he has reviewed her imaging.  Appreciate his help.  He feels that patient will need to be managed at a tertiary care center that has thoracic capabilities.  Discussed this with patient and husband who are comfortable with this plan.  They would like for me to start with [facility].   [facility] and unfortunately they are on diversion and do not have any floor beds.  Only able to accept ICU patients at this time.   Spoke with [facility] transfer center and at this time they are at capacity and would require discussing this with their medical director at 6:30 AM before they could agree to accept the patient.   Spoke to transfer center at [facility] and they transfer me to Dr. Haynes Hoehn with minimally invasive surgery.  We have reviewed the case together and he agrees to accept the patient in transfer.  Appreciate [facility] help with this patient.  Patient and family  have been updated.   5:22 AM  [facility] Ana here for transport.  Patient hemodynamically stable.  Pain and nausea well-controlled.  NG tube in place.  OUTSIDE RECORDS REVIEWED: Reviewed patient's last admission in March 2023.       FINAL CLINICAL IMPRESSION(S) / ED DIAGNOSES   Final diagnoses:  LLQ abdominal pain  Gastric outlet obstruction  Diaphragmatic hernia with obstruction, without gangrene  Acute respiratory failure with hypoxia (HCC)  Rx / DC Orders   ED Discharge Orders     None        Note:  This document was prepared using Dragon voice recognition software and may include unintentional dictation errors.   Nilaya Bouie, Layla Maw, DO 08/15/22 0160    Tennyson Wacha, Layla Maw, DO 08/15/22 Jeralyn Bennett

## 2022-08-14 NOTE — ED Triage Notes (Signed)
Pt reports ran out of her omeprazole and this evening started to have n/v, unable to keep water down per pt. Denies diarrhea, CP, or SOB. Pt shivering, reports she is cold, noted hands are chilled, reports chills and at home, and generalized bodyaches. Afebrile at current.

## 2022-08-15 ENCOUNTER — Emergency Department: Payer: Medicare HMO

## 2022-08-15 DIAGNOSIS — K56691 Other complete intestinal obstruction: Secondary | ICD-10-CM | POA: Diagnosis not present

## 2022-08-15 DIAGNOSIS — J9601 Acute respiratory failure with hypoxia: Secondary | ICD-10-CM | POA: Diagnosis not present

## 2022-08-15 DIAGNOSIS — K56609 Unspecified intestinal obstruction, unspecified as to partial versus complete obstruction: Secondary | ICD-10-CM | POA: Diagnosis not present

## 2022-08-15 DIAGNOSIS — R0602 Shortness of breath: Secondary | ICD-10-CM | POA: Diagnosis not present

## 2022-08-15 DIAGNOSIS — R0902 Hypoxemia: Secondary | ICD-10-CM | POA: Diagnosis not present

## 2022-08-15 DIAGNOSIS — J811 Chronic pulmonary edema: Secondary | ICD-10-CM | POA: Diagnosis not present

## 2022-08-15 DIAGNOSIS — K311 Adult hypertrophic pyloric stenosis: Secondary | ICD-10-CM | POA: Diagnosis not present

## 2022-08-15 DIAGNOSIS — R000 Tachycardia, unspecified: Secondary | ICD-10-CM | POA: Diagnosis not present

## 2022-08-15 DIAGNOSIS — K3189 Other diseases of stomach and duodenum: Secondary | ICD-10-CM | POA: Diagnosis not present

## 2022-08-15 DIAGNOSIS — K56601 Complete intestinal obstruction, unspecified as to cause: Secondary | ICD-10-CM | POA: Diagnosis not present

## 2022-08-15 DIAGNOSIS — I10 Essential (primary) hypertension: Secondary | ICD-10-CM | POA: Diagnosis not present

## 2022-08-15 DIAGNOSIS — Z20822 Contact with and (suspected) exposure to covid-19: Secondary | ICD-10-CM | POA: Diagnosis not present

## 2022-08-15 DIAGNOSIS — Z4659 Encounter for fitting and adjustment of other gastrointestinal appliance and device: Secondary | ICD-10-CM | POA: Diagnosis not present

## 2022-08-15 DIAGNOSIS — K6389 Other specified diseases of intestine: Secondary | ICD-10-CM | POA: Diagnosis not present

## 2022-08-15 DIAGNOSIS — F32A Depression, unspecified: Secondary | ICD-10-CM | POA: Diagnosis not present

## 2022-08-15 DIAGNOSIS — K440 Diaphragmatic hernia with obstruction, without gangrene: Secondary | ICD-10-CM | POA: Diagnosis not present

## 2022-08-15 DIAGNOSIS — Q790 Congenital diaphragmatic hernia: Secondary | ICD-10-CM | POA: Diagnosis not present

## 2022-08-15 MED ORDER — IOHEXOL 300 MG/ML  SOLN
100.0000 mL | Freq: Once | INTRAMUSCULAR | Status: AC | PRN
  Administered 2022-08-15: 100 mL via INTRAVENOUS

## 2022-08-15 MED ORDER — HYDROMORPHONE HCL 1 MG/ML IJ SOLN
1.0000 mg | Freq: Once | INTRAMUSCULAR | Status: AC
  Administered 2022-08-15: 1 mg via INTRAVENOUS
  Filled 2022-08-15: qty 1

## 2022-08-15 MED ORDER — ONDANSETRON HCL 4 MG/2ML IJ SOLN: 4.0000 mg | Freq: Four times a day (QID) | INTRAMUSCULAR | Status: DC | PRN

## 2022-08-15 MED ORDER — SODIUM CHLORIDE 0.9 % IV SOLN: INTRAVENOUS | Status: DC

## 2022-08-15 MED ORDER — METOCLOPRAMIDE HCL 5 MG/ML IJ SOLN
10.0000 mg | Freq: Once | INTRAMUSCULAR | Status: AC
  Administered 2022-08-15: 10 mg via INTRAVENOUS
  Filled 2022-08-15: qty 2

## 2022-08-15 MED ORDER — LORAZEPAM 2 MG/ML IJ SOLN
1.0000 mg | Freq: Once | INTRAMUSCULAR | Status: AC
  Administered 2022-08-15: 1 mg via INTRAVENOUS
  Filled 2022-08-15: qty 1

## 2022-08-15 MED ORDER — HYDROMORPHONE HCL 1 MG/ML IJ SOLN: 1.0000 mg | INTRAMUSCULAR | Status: DC | PRN

## 2022-08-15 MED ORDER — PANTOPRAZOLE SODIUM 40 MG IV SOLR
40.0000 mg | Freq: Once | INTRAVENOUS | Status: AC
  Administered 2022-08-15: 40 mg via INTRAVENOUS
  Filled 2022-08-15: qty 10

## 2022-08-15 NOTE — ED Notes (Signed)
Attempted to call report to [facility] at this time. Was informed by unit secretary that RN is busy at the moment and to call back.

## 2022-08-15 NOTE — ED Notes (Signed)
[facility] picked patient up at this time.

## 2022-08-17 ENCOUNTER — Telehealth: Payer: Self-pay

## 2022-08-17 NOTE — Telephone Encounter (Signed)
Medication refills - Patient left a message she is in need of new orders for her Amitriptyline 150 mg and later for her Clonazepam on 08/19/22. No current appointment set and last seen 03/10/22.

## 2022-08-18 ENCOUNTER — Other Ambulatory Visit: Payer: Self-pay | Admitting: Psychiatry

## 2022-08-18 DIAGNOSIS — F411 Generalized anxiety disorder: Secondary | ICD-10-CM

## 2022-08-18 DIAGNOSIS — F3162 Bipolar disorder, current episode mixed, moderate: Secondary | ICD-10-CM

## 2022-08-18 MED ORDER — AMITRIPTYLINE HCL 150 MG PO TABS: ORAL_TABLET | ORAL | 5 refills | Status: DC

## 2022-08-18 MED ORDER — BUPROPION HCL ER (XL) 150 MG PO TB24: 150.0000 mg | ORAL_TABLET | Freq: Every day | ORAL | 5 refills | Status: DC

## 2022-08-18 MED ORDER — QUETIAPINE FUMARATE 400 MG PO TABS: 400.0000 mg | ORAL_TABLET | Freq: Two times a day (BID) | ORAL | 5 refills | Status: DC

## 2022-08-18 MED ORDER — CLONAZEPAM 0.5 MG PO TABS: 0.5000 mg | ORAL_TABLET | Freq: Two times a day (BID) | ORAL | 5 refills | Status: DC

## 2022-08-24 DIAGNOSIS — R000 Tachycardia, unspecified: Secondary | ICD-10-CM | POA: Diagnosis not present

## 2022-10-15 ENCOUNTER — Observation Stay: Payer: Medicare HMO

## 2022-10-15 ENCOUNTER — Emergency Department: Payer: Medicare HMO

## 2022-10-15 ENCOUNTER — Observation Stay
Admission: EM | Admit: 2022-10-15 | Discharge: 2022-10-16 | Disposition: A | Discharge status: Home / Self Care | Payer: Medicare HMO | Attending: Internal Medicine | Admitting: Internal Medicine

## 2022-10-15 DIAGNOSIS — R109 Unspecified abdominal pain: Secondary | ICD-10-CM

## 2022-10-15 DIAGNOSIS — F19939 Other psychoactive substance use, unspecified with withdrawal, unspecified: Secondary | ICD-10-CM | POA: Diagnosis not present

## 2022-10-15 DIAGNOSIS — I340 Nonrheumatic mitral (valve) insufficiency: Secondary | ICD-10-CM | POA: Diagnosis not present

## 2022-10-15 DIAGNOSIS — R1084 Generalized abdominal pain: Secondary | ICD-10-CM | POA: Diagnosis not present

## 2022-10-15 DIAGNOSIS — Z79899 Other long term (current) drug therapy: Secondary | ICD-10-CM | POA: Insufficient documentation

## 2022-10-15 DIAGNOSIS — F3130 Bipolar disorder, current episode depressed, mild or moderate severity, unspecified: Secondary | ICD-10-CM | POA: Insufficient documentation

## 2022-10-15 DIAGNOSIS — R55 Syncope and collapse: Secondary | ICD-10-CM | POA: Diagnosis not present

## 2022-10-15 DIAGNOSIS — F319 Bipolar disorder, unspecified: Secondary | ICD-10-CM | POA: Diagnosis present

## 2022-10-15 DIAGNOSIS — R000 Tachycardia, unspecified: Secondary | ICD-10-CM | POA: Diagnosis not present

## 2022-10-15 DIAGNOSIS — I10 Essential (primary) hypertension: Secondary | ICD-10-CM | POA: Diagnosis not present

## 2022-10-15 DIAGNOSIS — G8929 Other chronic pain: Secondary | ICD-10-CM | POA: Diagnosis present

## 2022-10-15 DIAGNOSIS — I469 Cardiac arrest, cause unspecified: Secondary | ICD-10-CM | POA: Diagnosis not present

## 2022-10-15 DIAGNOSIS — F17210 Nicotine dependence, cigarettes, uncomplicated: Secondary | ICD-10-CM | POA: Insufficient documentation

## 2022-10-15 DIAGNOSIS — R0902 Hypoxemia: Secondary | ICD-10-CM | POA: Diagnosis not present

## 2022-10-15 DIAGNOSIS — F3162 Bipolar disorder, current episode mixed, moderate: Secondary | ICD-10-CM

## 2022-10-15 DIAGNOSIS — R569 Unspecified convulsions: Secondary | ICD-10-CM | POA: Diagnosis not present

## 2022-10-15 LAB — HEPATIC FUNCTION PANEL
ALT: 18 U/L (ref 0–44)
AST: 24 U/L (ref 15–41)
Albumin: 3.6 g/dL (ref 3.5–5.0)
Alkaline Phosphatase: 67 U/L (ref 38–126)
Bilirubin, Direct: <0.1 mg/dL (ref 0.0–0.2)
Total Bilirubin: 0.5 mg/dL (ref 0.3–1.2)
Total Protein: 7.2 g/dL (ref 6.5–8.1)

## 2022-10-15 LAB — TROPONIN I (HIGH SENSITIVITY)
Troponin I (High Sensitivity): 5 ng/L (ref <18)
Troponin I (High Sensitivity): 5 ng/L (ref <18)

## 2022-10-15 LAB — CBC WITH DIFFERENTIAL/PLATELET
Abs Immature Granulocytes: 0.04 10*3/uL (ref 0.00–0.07)
Basophils Absolute: 0.1 10*3/uL (ref 0.0–0.1)
Basophils Relative: 1 %
Eosinophils Absolute: 0.2 10*3/uL (ref 0.0–0.5)
Eosinophils Relative: 4 %
HCT: 42.5 % (ref 36.0–46.0)
Hemoglobin: 13.8 g/dL (ref 12.0–15.0)
Immature Granulocytes: 1 %
Lymphocytes Relative: 30 %
Lymphs Abs: 2.1 10*3/uL (ref 0.7–4.0)
MCH: 28.8 pg (ref 26.0–34.0)
MCHC: 32.5 g/dL (ref 30.0–36.0)
MCV: 88.5 fL (ref 80.0–100.0)
Monocytes Absolute: 0.6 10*3/uL (ref 0.1–1.0)
Monocytes Relative: 8 %
Neutro Abs: 3.9 10*3/uL (ref 1.7–7.7)
Neutrophils Relative %: 56 %
Platelets: 198 10*3/uL (ref 150–400)
RBC: 4.8 MIL/uL (ref 3.87–5.11)
RDW: 16.8 % — ABNORMAL HIGH (ref 11.5–15.5)
WBC: 6.9 10*3/uL (ref 4.0–10.5)
nRBC: 0 % (ref 0.0–0.2)

## 2022-10-15 LAB — BASIC METABOLIC PANEL
Anion gap: 9 (ref 5–15)
BUN: 18 mg/dL (ref 6–20)
CO2: 23 mmol/L (ref 22–32)
Calcium: 8.5 mg/dL — ABNORMAL LOW (ref 8.9–10.3)
Chloride: 105 mmol/L (ref 98–111)
Creatinine, Ser: 0.84 mg/dL (ref 0.44–1.00)
GFR, Estimated: >60 mL/min (ref >60)
Glucose, Bld: 107 mg/dL — ABNORMAL HIGH (ref 70–99)
Potassium: 4.1 mmol/L (ref 3.5–5.1)
Sodium: 137 mmol/L (ref 135–145)

## 2022-10-15 LAB — MAGNESIUM: Magnesium: 2.4 mg/dL (ref 1.7–2.4)

## 2022-10-15 LAB — LIPASE, BLOOD: Lipase: 32 U/L (ref 11–51)

## 2022-10-15 MED ORDER — ENOXAPARIN SODIUM 40 MG/0.4ML IJ SOSY
40.0000 mg | PREFILLED_SYRINGE | INTRAMUSCULAR | Status: DC
  Administered 2022-10-15: 40 mg via SUBCUTANEOUS
  Filled 2022-10-15: qty 0.4

## 2022-10-15 MED ORDER — POLYETHYLENE GLYCOL 3350 17 G PO PACK: 17.0000 g | PACK | Freq: Every day | ORAL | Status: DC | PRN

## 2022-10-15 MED ORDER — ONDANSETRON HCL 4 MG/2ML IJ SOLN: 4.0000 mg | Freq: Four times a day (QID) | INTRAMUSCULAR | Status: DC | PRN

## 2022-10-15 MED ORDER — ACETAMINOPHEN 325 MG PO TABS
650.0000 mg | ORAL_TABLET | Freq: Four times a day (QID) | ORAL | Status: DC | PRN
  Administered 2022-10-16: 650 mg via ORAL
  Filled 2022-10-15: qty 2

## 2022-10-15 MED ORDER — SODIUM CHLORIDE 0.9 % IV BOLUS
500.0000 mL | Freq: Once | INTRAVENOUS | Status: AC
  Administered 2022-10-15: 500 mL via INTRAVENOUS

## 2022-10-15 MED ORDER — SODIUM CHLORIDE 0.9% FLUSH
3.0000 mL | Freq: Two times a day (BID) | INTRAVENOUS | Status: DC
  Administered 2022-10-15 – 2022-10-16 (×2): 3 mL via INTRAVENOUS

## 2022-10-15 MED ORDER — CLONAZEPAM 0.5 MG PO TABS
0.5000 mg | ORAL_TABLET | Freq: Two times a day (BID) | ORAL | Status: DC
  Administered 2022-10-15 – 2022-10-16 (×2): 0.5 mg via ORAL
  Filled 2022-10-15 (×2): qty 1

## 2022-10-15 MED ORDER — LORAZEPAM 2 MG/ML IJ SOLN
1.0000 mg | Freq: Once | INTRAMUSCULAR | Status: AC
  Administered 2022-10-15: 1 mg via INTRAVENOUS
  Filled 2022-10-15: qty 1

## 2022-10-15 MED ORDER — ONDANSETRON HCL 4 MG PO TABS: 4.0000 mg | ORAL_TABLET | Freq: Four times a day (QID) | ORAL | Status: DC | PRN

## 2022-10-15 MED ORDER — ACETAMINOPHEN 650 MG RE SUPP: 650.0000 mg | Freq: Four times a day (QID) | RECTAL | Status: DC | PRN

## 2022-10-15 MED ORDER — AMITRIPTYLINE HCL 100 MG PO TABS
150.0000 mg | ORAL_TABLET | Freq: Every day | ORAL | Status: DC
  Administered 2022-10-15: 150 mg via ORAL
  Filled 2022-10-15: qty 1.5

## 2022-10-15 MED ORDER — OXYCODONE HCL 5 MG PO TABS
5.0000 mg | ORAL_TABLET | Freq: Three times a day (TID) | ORAL | Status: DC | PRN
  Administered 2022-10-16 (×2): 5 mg via ORAL
  Filled 2022-10-15 (×2): qty 1

## 2022-10-15 MED ORDER — TRAMADOL HCL 50 MG PO TABS
50.0000 mg | ORAL_TABLET | Freq: Once | ORAL | Status: AC
  Administered 2022-10-15: 50 mg via ORAL
  Filled 2022-10-15: qty 1

## 2022-10-15 MED ORDER — QUETIAPINE FUMARATE 200 MG PO TABS
400.0000 mg | ORAL_TABLET | Freq: Two times a day (BID) | ORAL | Status: DC
  Administered 2022-10-15 – 2022-10-16 (×2): 400 mg via ORAL
  Filled 2022-10-15 (×2): qty 2

## 2022-10-15 NOTE — Plan of Care (Signed)

## 2022-10-15 NOTE — ED Provider Notes (Signed)
[facility] Provider Note    Event Date/Time   First MD Initiated Contact with Patient 10/15/22 1209     (approximate)   History   Seizures   HPI  Maria Hodge is a 62 y.o. female with a history of hypertension and small bowel obstruction who presents with a possible seizure.  Per EMS, the patient's husband witnessed the patient having a seizure-like episode.  Per EMS, the husband was not sure if the patient was breathing and attempted CPR although she never lost a pulse.  The patient herself does not remember what happened.  She states she was sitting watching TV and was feeling fine this morning.  She denies any prior history of a seizure.  She denies any new medications.  The patient denies headache or dizziness.  However she does report some abdominal pain.  This has been persistent since an abdominal surgery for bowel obstruction back in December.  I reviewed the past medical records.  The patient's most recent ED visit was on 12/15 of last year for nausea, vomiting and abdominal pain.  She was found to have an SBO but left AMA.  Her other most recent documented encounter was with Dr. Weber Cooks from psychiatry on 7/11 for psychiatric evaluation.   Physical Exam   Triage Vital Signs: ED Triage Vitals  Enc Vitals Group     BP      Pulse      Resp      Temp      Temp src      SpO2      Weight      Height      Head Circumference      Peak Flow      Pain Score      Pain Loc      Pain Edu?      Excl. in [place]?     Most recent vital signs: Vitals:   10/15/22 1214 10/15/22 1217  BP: 127/85   Pulse: (!) 109   Resp: 15   Temp: 98.6 F (37 C)   SpO2: 93% 94%     General: Alert and oriented, comfortable appearing. CV:  Good peripheral perfusion.  Resp:  Normal effort.  Abd:  Soft with mild mid abdominal discomfort.  No focal tenderness.  No distention.  Other:  EOMI.  PERRLA.  No facial droop.  Normal speech.  Motor and sensory intact in all  extremities.  Normal coordination with no ataxia on finger-to-nose.  Mild tremor.  No pronator drift.   ED Results / Procedures / Treatments   Labs (all labs ordered are listed, but only abnormal results are displayed) Labs Reviewed  BASIC METABOLIC PANEL - Abnormal; Notable for the following components:      Result Value   Glucose, Bld 107 (*)    Calcium 8.5 (*)    All other components within normal limits  CBC WITH DIFFERENTIAL/PLATELET - Abnormal; Notable for the following components:   RDW 16.8 (*)    All other components within normal limits  HEPATIC FUNCTION PANEL  LIPASE, BLOOD  TROPONIN I (HIGH SENSITIVITY)     EKG  ED ECG REPORT I, Arta Silence, the attending physician, personally viewed and interpreted this ECG.  Date: 10/15/2022 EKG Time: 1208 Rate: 111 Rhythm: Sinus tachycardia QRS Axis: normal Intervals: normal ST/T Wave abnormalities: normal Narrative Interpretation: no evidence of acute ischemia    RADIOLOGY  CT head: I independently viewed and interpreted the images; there  is no ICH.  Radiology report indicates no acute abnormality.   PROCEDURES:  Critical Care performed: No  Procedures   MEDICATIONS ORDERED IN ED: Medications  sodium chloride 0.9 % bolus 500 mL (0 mLs Intravenous Stopped 10/15/22 1337)  traMADol (ULTRAM) tablet 50 mg (50 mg Oral Given 10/15/22 1243)     IMPRESSION / MDM / ASSESSMENT AND PLAN / ED COURSE  I reviewed the triage vital signs and the nursing notes.  62 year old female with PMH as noted above presents after apparent witnessed seizure.  She is currently asymptomatic except for subacute to chronic abdominal discomfort after recent surgery.  Neurologic exam is normal and her vital signs are normal except for mild tachycardia.  Differential diagnosis includes, but is not limited to, epileptic seizure pseudoseizure, syncope with convulsions.  There is no evidence that the patient actually had a cardiac arrest  despite the husband's attempting CPR.  We will obtain first-time seizure workup including CT head, labs, and observe the patient in the ED for few hours.  Patient's presentation is most consistent with acute presentation with potential threat to life or bodily function.  The patient is on the cardiac monitor to evaluate for evidence of arrhythmia and/or significant heart rate changes.  ----------------------------------------- 2:53 PM on 10/15/2022 -----------------------------------------  Lab workup is unremarkable.  There is no anemia or leukocytosis.  LFTs and lipase normal.  Electrolytes are normal.  CT shows no acute findings.  On further discussion with the patient and her husband who is present during the event, he states that the patient was sitting watching TV, suddenly laid back and clutched her chest, and then passed out.  He could not feel a pulse so he started CPR.  She came to after a few minutes, but then had a second similar episode.  Overall I doubt that the patient had a true cardiac arrest since she came to without any acute intervention, and likely had a thready pulse during a syncopal event.  Although her seizure workup is negative,  given that these episodes sound more like syncope with no prodrome she will benefit from admission for further workup and monitoring.  I then consulted Dr. Charleen Kirks from the hospitalist service; based on her discussion she agrees to admit the patient.   FINAL CLINICAL IMPRESSION(S) / ED DIAGNOSES   Final diagnoses:  Seizure-like activity ([place])  Syncope, unspecified syncope type     Rx / DC Orders   ED Discharge Orders     None        Note:  This document was prepared using Dragon voice recognition software and may include unintentional dictation errors.    Arta Silence, MD 10/15/22 [phone]

## 2022-10-15 NOTE — Assessment & Plan Note (Addendum)
Patient states she has had chronic abdominal pain for the last 2 months after having a hernia repair after developing SBO.  She has not followed up with her surgeon since then.  She states the pain is usually in bilateral lower quadrants with radiation up into the periumbilical area.  Stable issue during this hospitalization

## 2022-10-15 NOTE — Assessment & Plan Note (Addendum)
Given normal labs, workup, CT scan of head, but witnessed event, deeper history taken.  Patient has been on Klonopin twice a day for several years.  Patient states that she had ran out on 2/11.  She had not taken her second dose on 2/11 and no doses on 2/12 or 2/13.  Timeframe is appropriate for withdrawal seizure.  Discussed with patient, she says finances were part of the reason.  She received some benzodiazepines while in the hospital.  She says that she has additional prescriptions and plans to get this medicine refilled.  I advised the patient that if it looks like in the future she is going to run out to, consider spacing out her medication rather than taking it on a normal schedule and then stopping.  She says she will do so.  No reason for antiepileptic drugs.  Patient stable.  Stable for discharge.

## 2022-10-15 NOTE — ED Notes (Signed)
Patient reports abdominal pain from a recent hernia repair surgery that she had about 4 weeks ago. Patient reports that the pain is no different today

## 2022-10-15 NOTE — ED Triage Notes (Addendum)
Patient presents to the ER from home via EMS after husband described seizure like activity. Patient does not have a history of seizures. Unknown length of seizure. Patient awake in ER and answering questions appropriately. Patient denies headache.

## 2022-10-15 NOTE — Assessment & Plan Note (Addendum)
-   Restart home Seroquel and amitriptyline - Restart home clonazepam - Hold home Wellbutrin in the setting of syncope of unknown etiology with some suspicion for seizure-resumed on discharge

## 2022-10-15 NOTE — H&P (Addendum)
History and Physical    Patient: Maria Hodge [id number] DOB: 1960-11-17 DOA: 10/15/2022 DOS: the patient was seen and examined on 10/15/2022 PCP: Pcp, No  Patient coming from: Home  Chief Complaint:  Chief Complaint  Patient presents with   Seizures   HPI: Maria Hodge is a 62 y.o. female with medical history significant of generalized anxiety disorder, bipolar 1 disorder, SBO 2/2 hernia s/p hernia repair, complaints to the ED with complaints of seizure-like activity.  Maria Hodge states that she was in her normal state of health this morning when she was watching TV with her Hodge.  The next thing she remembers is being on the ground and EMS surrounding her.  She does not recall what happened in between these events.  She states in the last days to weeks, she has had persistent daily chronic abdominal pain, but this is unchanged.  She denies any new fever, chills, headaches, dizziness, tremors, or any recent illness.  She denies any personal history of seizure.  She denies any history of heart disease.  She denies any history of arrhythmia in her family.  She notes that her sister had a massive MI that led to death at the age of 63.  Maria Hodge states she has been taking clonazepam twice daily for approximately 1 year now.  She took her dose yesterday morning but has since run out and cannot afford to pick up her refill. She notes a history of chronic tremor that is unchanged.  Collateral history obtained from Maria Hodge.  Maria Hodge stated that she was watching TV sitting up when she suddenly laid back and clutched her chest and then lost consciousness.  She then had bilateral upper arm jerking. He could not feel a pulse so he started CPR.  After a few minutes, she regained consciousness.  He did not notice any lower extremity jerking. When she came to, she was confused for approximately 5-10 minutes.  ED course: On arrival to the ED, patient was normotensive at 127/85  with heart rate of 107.  She was afebrile at 98.6.  She was saturating at 94% on room air.  Initial workup remarkable for CBC with WBC of 6.9, hemoglobin of 13.8, sodium of 137, potassium of 4.1, bicarb 23, creatinine 0.84, AST 24, ALT 18 and GFR above 60. CT head was obtained that did not show any acute intracranial process.  TRH contacted for admission for seizure-like activity.  Review of Systems: As mentioned in the history of present illness. All other systems reviewed and are negative.  Past Medical History:  Diagnosis Date   Anxiety    Arrhythmia    Arthritis    hands   Depression    GERD (gastroesophageal reflux disease)    Heart murmur    Hypertension    Wears dentures    full upper   Past Surgical History:  Procedure Laterality Date   BACK SURGERY  2010   ORIF ANKLE FRACTURE Left 11/09/2017   Procedure: OPEN REDUCTION INTERNAL FIXATION (ORIF) ANKLE FRACTURE WITH POSSIBLE SYNDESMOSIS REPAIR;  Surgeon: Leim Fabry, MD;  Location: [facility];  Service: Orthopedics;  Laterality: Left;  GENERAL WITH NERVE BLOCK ALEX SYNDEAMOSIS TIGHTROPE MARK ANKLE FRACTURE PLATES  C ARM  PLASTER SPLINT   ORIF ANKLE FRACTURE Right 12/24/2021   Procedure: OPEN REDUCTION INTERNAL FIXATION (ORIF) ANKLE FRACTURE;  Surgeon: Samara Deist, DPM;  Location: [facility];  Service: Podiatry;  Laterality: Right;  Anesthesia: General with pop liteal  STERIOD INJECTION  12/21/2011   Procedure: MINOR STEROID INJECTION;  Surgeon: Lowella Grip, MD;  Location: [facility];  Service: Orthopedics;  Laterality: Right;  Right L3-4 transforaminal epidural steroid injection, attempted right L4-5 epidural steroid injection( 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)   Social History:  reports that she has been smoking cigarettes. She started smoking about 30 years ago. She has been smoking an average of .5 packs per day. She has never used smokeless tobacco. She reports that she does  not drink alcohol and does not use drugs.  Allergies  Allergen Reactions   Penicillins Anaphylaxis and Swelling   Aspirin Other (See Comments)    Gastritis   Codeine Hives        Hydrocodone Hives    Family History  Problem Relation Age of Onset   Heart failure Mother    Hypertension Mother    Depression Mother    Anxiety disorder Mother    Cancer Father    Rheum arthritis Father    Heart failure Sister    Rheum arthritis Sister    Heart failure Sister    Cancer Sister     Prior to Admission medications   Medication Sig Start Date End Date Taking? Authorizing Provider  acetaminophen (TYLENOL) 500 MG tablet Take 500 mg by mouth every 6 (six) hours as needed.    [provider]  amitriptyline (ELAVIL) 150 MG tablet TAKE 1 TABLET BY MOUTH EVERYDAY AT BEDTIME 08/18/22   Clapacs, Madie Reno, MD  buPROPion (WELLBUTRIN XL) 150 MG 24 hr tablet Take 1 tablet (150 mg total) by mouth daily. 08/18/22   Clapacs, Madie Reno, MD  clonazePAM (KLONOPIN) 0.5 MG tablet Take 1 tablet (0.5 mg total) by mouth 2 (two) times daily. 08/12/21 08/12/22  Clapacs, Madie Reno, MD  clonazePAM (KLONOPIN) 0.5 MG tablet Take 1 tablet (0.5 mg total) by mouth 2 (two) times daily. 08/18/22   Clapacs, Madie Reno, MD  esomeprazole (NEXIUM) 20 MG capsule Take 20 mg by mouth daily.    [provider]  oxyCODONE-acetaminophen (PERCOCET) 5-325 MG tablet Take 1-2 tablets by mouth every 6 (six) hours as needed for severe pain. Max 6 tabs per day 12/24/21   Samara Deist, DPM  oxyCODONE-acetaminophen (PERCOCET/ROXICET) 5-325 MG tablet Take by mouth.    [provider]  QUEtiapine (SEROQUEL) 400 MG tablet TAKE 1 TABLET BY MOUTH TWICE A DAY 07/25/20   Clapacs, [name], MD  QUEtiapine (SEROQUEL) 400 MG tablet TAKE 1 TABLET BY MOUTH TWICE A DAY 03/24/21   Clapacs, Madie Reno, MD  QUEtiapine (SEROQUEL) 400 MG tablet Take 1 tablet (400 mg total) by mouth 2 (two) times daily. 07/30/20   Clapacs, Madie Reno, MD  QUEtiapine  (SEROQUEL) 400 MG tablet Take 1 tablet (400 mg total) by mouth 2 (two) times daily. 08/18/22   Gonzella Lex, MD    Physical Exam: Vitals:   10/15/22 1500 10/15/22 1530 10/15/22 1600 10/15/22 1630  BP: (!) 125/92 127/85 (!) 152/104 (!) 142/97  Pulse: (!) 110 (!) 111 (!) 113 (!) 116  Resp: 16 19 (!) 23 17  Temp:      SpO2: 94% 92% 94% 96%  Weight:      Height:       Physical Exam Vitals and nursing note reviewed.  Constitutional:      General: She is not in acute distress.    Appearance: She is normal weight. She is not toxic-appearing.  HENT:     Head:  Normocephalic and atraumatic.     Mouth/Throat:     Mouth: Mucous membranes are dry.     Pharynx: Oropharynx is clear.     Comments: No erythema.  Eyes:     General: No scleral icterus.    Extraocular Movements: Extraocular movements intact.     Conjunctiva/sclera: Conjunctivae normal.     Pupils: Pupils are equal, round, and reactive to light.  Cardiovascular:     Rate and Rhythm: Normal rate and regular rhythm.     Heart sounds: Murmur (2/6 systolic murmur heard best at the upper sternal borders) heard.     No gallop.  Pulmonary:     Effort: Pulmonary effort is normal. No respiratory distress.     Breath sounds: Normal breath sounds. No wheezing, rhonchi or rales.  Abdominal:     General: Bowel sounds are normal. There is no distension.     Palpations: Abdomen is soft.     Tenderness: There is abdominal tenderness (Peri-umbilical). There is no guarding.     Hernia: No hernia is present.  Musculoskeletal:        General: No signs of injury.     Right lower leg: No edema.     Left lower leg: No edema.  Skin:    General: Skin is warm and dry.     Findings: No rash.  Neurological:     Mental Status: She is alert.     Comments:  Patient is alert and oriented x 3.  Able to follow commands consistently and answers questions appropriately. No dysarthria or facial asymmetry Sensation intact throughout, however patient  notes subjective increase sensation on the left side of her face, forehead included, compared to the right 5 out of 5 strength throughout Bilateral upper extremity tremor that is worse with movement then at rest.  Psychiatric:        Mood and Affect: Mood normal.        Behavior: Behavior normal.     Data Reviewed: CBC with WBC of 6.9, hemoglobin of 13.8, platelets of 198.  Will CMP with sodium 137, potassium 4.1, bicarb 23, glucose 107, BUN 18, creatinine 0.84, calcium 8.5, anion gap 9, AST 24, ALT 18, and GFR above 60  EKG personally reviewed.  Sinus rhythm with rate of 111.  Minimal right axis deviation.  Some J-point elevation in lead V2, however no other ST or T wave changes consistent with acute ischemia.  No QT prolongation.  Portable Chest 1 View  Result Date: 10/15/2022 CLINICAL DATA:  Seizure-like activity EXAM: PORTABLE CHEST 1 VIEW COMPARISON:  Chest x-ray 08/15/2022 FINDINGS: There some strandy patchy opacities in the lung bases. There is no pleural effusion or pneumothorax. The cardiomediastinal silhouette is within normal limits. No acute fractures are seen. IMPRESSION: Strandy patchy opacities in the lung bases may represent atelectasis or infection. Electronically Signed   By: Ronney Asters M.D.   On: 10/15/2022 16:46   CT Head Wo Contrast  Result Date: 10/15/2022 CLINICAL DATA:  Seizure, new-onset, no history of trauma EXAM: CT HEAD WITHOUT CONTRAST TECHNIQUE: Contiguous axial images were obtained from the base of the skull through the vertex without intravenous contrast. RADIATION DOSE REDUCTION: This exam was performed according to the departmental dose-optimization program which includes automated exposure control, adjustment of the mA and/or kV according to patient size and/or use of iterative reconstruction technique. COMPARISON:  07/03/2013 FINDINGS: Brain: No evidence of acute infarction, hemorrhage, hydrocephalus, extra-axial collection or mass lesion/mass effect.  Vascular: No hyperdense vessel  or unexpected calcification. Skull: Normal. Negative for fracture or focal lesion. Sinuses/Orbits: No acute finding. IMPRESSION: No acute intracranial process. Electronically Signed   By: Sammie Bench M.D.   On: 10/15/2022 12:46    There are no new results to review at this time.  Assessment and Plan:  * Syncope and collapse Patient is coming in today after a syncopal episode at home during which she had bilateral arm jerking.  Patient has no recollection of the event and was confused for 5 to 10 minutes afterwards.  She is back to her baseline at this time.  No prior history of cardiac or neurological deficits.  No electrolyte abnormalities to explain.  Patient notes that she has missed her clonazepam for 24 hours now, however it would be unexpected to cause a seizure in such a short window.  She denies any alcohol or nonprescribed drug use.  She is on Wellbutrin.  No suspicion at this time for infection although chest x-ray shows possible bibasilar opacities.  She denies any shortness of breath or cough, so most likely atelectasis.  - Neurology consulted; appreciate their recommendations - Given patient is at baseline, will touch base with neurology prior to ordering EEG - One-time dose of Ativan 1 mg due to possible benzo withdrawal - Restart home clonazepam this evening - MRI brain ordered - Echocardiogram - Telemetry monitoring - UDS pending  Bipolar mood disorder (HCC) - Restart home Seroquel and amitriptyline - Restart home clonazepam - Hold home Wellbutrin in the setting of syncope of unknown etiology with some suspicion for seizure  Chronic abdominal pain Patient states she has had chronic abdominal pain for the last 2 months after having a hernia repair after developing SBO.  She has not followed up with her surgeon since then.  She states the pain is usually in bilateral lower quadrants with radiation up into the periumbilical area.  - Will  consider CT of the abdomen.  No suspicion at this time for underlying infection, however that could certainly be playing a role in Maria presentation  Advance Care Planning:   Code Status: Full Code verified by patient  Consults: Neurology  Family Communication: Maria Hodge updated via telephone  Severity of Illness: The appropriate patient status for this patient is OBSERVATION. Observation status is judged to be reasonable and necessary in order to provide the required intensity of service to ensure the Maria safety. The Maria presenting symptoms, physical exam findings, and initial radiographic and laboratory data in the context of their medical condition is felt to place them at decreased risk for further clinical deterioration. Furthermore, it is anticipated that the patient will be medically stable for discharge from the hospital within 2 midnights of admission.   Author: Jose Persia, MD 10/15/2022 5:01 PM  For on call review [email or web address].

## 2022-10-16 ENCOUNTER — Encounter: Payer: Self-pay | Admitting: Internal Medicine

## 2022-10-16 ENCOUNTER — Other Ambulatory Visit: Payer: Self-pay

## 2022-10-16 ENCOUNTER — Observation Stay (HOSPITAL_BASED_OUTPATIENT_CLINIC_OR_DEPARTMENT_OTHER)
Admit: 2022-10-16 | Discharge: 2022-10-16 | Disposition: A | Discharge status: Home / Self Care | Payer: Medicare HMO | Attending: Internal Medicine | Admitting: Internal Medicine

## 2022-10-16 DIAGNOSIS — F3162 Bipolar disorder, current episode mixed, moderate: Secondary | ICD-10-CM

## 2022-10-16 DIAGNOSIS — I10 Essential (primary) hypertension: Secondary | ICD-10-CM | POA: Diagnosis not present

## 2022-10-16 DIAGNOSIS — F19930 Other psychoactive substance use, unspecified with withdrawal, uncomplicated: Secondary | ICD-10-CM

## 2022-10-16 DIAGNOSIS — R569 Unspecified convulsions: Secondary | ICD-10-CM | POA: Diagnosis not present

## 2022-10-16 DIAGNOSIS — R55 Syncope and collapse: Secondary | ICD-10-CM

## 2022-10-16 LAB — COMPREHENSIVE METABOLIC PANEL
ALT: 16 U/L (ref 0–44)
AST: 16 U/L (ref 15–41)
Albumin: 3.3 g/dL — ABNORMAL LOW (ref 3.5–5.0)
Alkaline Phosphatase: 65 U/L (ref 38–126)
Anion gap: 7 (ref 5–15)
BUN: 10 mg/dL (ref 6–20)
CO2: 23 mmol/L (ref 22–32)
Calcium: 8.4 mg/dL — ABNORMAL LOW (ref 8.9–10.3)
Chloride: 103 mmol/L (ref 98–111)
Creatinine, Ser: 0.61 mg/dL (ref 0.44–1.00)
GFR, Estimated: >60 mL/min (ref >60)
Glucose, Bld: 91 mg/dL (ref 70–99)
Potassium: 3.2 mmol/L — ABNORMAL LOW (ref 3.5–5.1)
Sodium: 133 mmol/L — ABNORMAL LOW (ref 135–145)
Total Bilirubin: 0.6 mg/dL (ref 0.3–1.2)
Total Protein: 6.8 g/dL (ref 6.5–8.1)

## 2022-10-16 LAB — CBC WITH DIFFERENTIAL/PLATELET
Abs Immature Granulocytes: 0.02 10*3/uL (ref 0.00–0.07)
Basophils Absolute: 0.1 10*3/uL (ref 0.0–0.1)
Basophils Relative: 1 %
Eosinophils Absolute: 0.2 10*3/uL (ref 0.0–0.5)
Eosinophils Relative: 3 %
HCT: 39.5 % (ref 36.0–46.0)
Hemoglobin: 12.8 g/dL (ref 12.0–15.0)
Immature Granulocytes: 0 %
Lymphocytes Relative: 36 %
Lymphs Abs: 2.4 10*3/uL (ref 0.7–4.0)
MCH: 28.4 pg (ref 26.0–34.0)
MCHC: 32.4 g/dL (ref 30.0–36.0)
MCV: 87.8 fL (ref 80.0–100.0)
Monocytes Absolute: 0.6 10*3/uL (ref 0.1–1.0)
Monocytes Relative: 9 %
Neutro Abs: 3.3 10*3/uL (ref 1.7–7.7)
Neutrophils Relative %: 51 %
Platelets: 189 10*3/uL (ref 150–400)
RBC: 4.5 MIL/uL (ref 3.87–5.11)
RDW: 16.6 % — ABNORMAL HIGH (ref 11.5–15.5)
WBC: 6.6 10*3/uL (ref 4.0–10.5)
nRBC: 0 % (ref 0.0–0.2)

## 2022-10-16 LAB — ECHOCARDIOGRAM COMPLETE
AR max vel: 3.01 cm2
AV Area VTI: 3.66 cm2
AV Area mean vel: 2.84 cm2
AV Mean grad: 3 mmHg
AV Peak grad: 6 mmHg
Ao pk vel: 1.22 m/s
Area-P 1/2: 4.41 cm2
Height: 65 in
S' Lateral: 2.4 cm
Weight: 2208.13 oz

## 2022-10-16 MED ORDER — POTASSIUM CHLORIDE CRYS ER 20 MEQ PO TBCR
40.0000 meq | EXTENDED_RELEASE_TABLET | Freq: Once | ORAL | Status: AC
  Administered 2022-10-16: 40 meq via ORAL
  Filled 2022-10-16: qty 2

## 2022-10-16 MED ORDER — QUETIAPINE FUMARATE 400 MG PO TABS: 400.0000 mg | ORAL_TABLET | Freq: Two times a day (BID) | ORAL | 1 refills | Status: DC

## 2022-10-16 NOTE — Progress Notes (Signed)
*  PRELIMINARY RESULTS* Echocardiogram 2D Echocardiogram has been performed.  Maria Hodge 10/16/2022, 1:57 PM

## 2022-10-16 NOTE — TOC Progression Note (Signed)
Transition of Care [facility]) - Progression Note    Patient Details  Name: SINAI HAMIDEH MRN: [id number] Date of Birth: [birth year]  Transition of Care [facility]) CM/SW [place], RN Phone Number: 10/16/2022, 2:00 PM  Clinical Narrative:    Patient from home with husband  Transition of Care [facility]) Screening Note   Patient Details  Name: SHANTELL REMALY Date of Birth: 1961-06-10   Transition of Care [facility]) CM/SW Contact:    Conception Oms, RN Phone Number: 10/16/2022, 2:00 PM    Transition of Care Department [facility]) has reviewed patient and no TOC needs have been identified at this time. We will continue to monitor patient advancement through interdisciplinary progression rounds. If new patient transition needs arise, please place a TOC consult.     Expected Discharge Plan: Home/Self Care Barriers to Discharge: Continued Medical Work up  Expected Discharge Plan and Services       Living arrangements for the past 2 months: Single Family Home                                       Social Determinants of Health (SDOH) Interventions SDOH Screenings   Food Insecurity: No Food Insecurity (10/15/2022)  Housing: Low Risk  (10/15/2022)  Transportation Needs: No Transportation Needs (10/15/2022)  Utilities: Not At Risk (10/15/2022)  Tobacco Use: High Risk (10/16/2022)    Readmission Risk Interventions     No data to display

## 2022-10-16 NOTE — Discharge Summary (Signed)
Physician Discharge Summary   Patient: Maria Hodge MRN: [id number] DOB: Oct 12, 1960  Admit date:     10/15/2022  Discharge date: {dischdate:26783}  Discharge Physician: Annita Brod   PCP: Pcp, No   Recommendations at discharge:  {Tip this will not be part of the note when signed- Example include specific recommendations for outpatient follow-up, pending tests to follow-up on. (Optional):26781}  ***  Discharge Diagnoses: Principal Problem:   Syncope and collapse Active Problems:   Bipolar mood disorder (HCC)   Chronic abdominal pain  Resolved Problems:   * No resolved hospital problems. [facility] Course: No notes on file  Assessment and Plan: * Syncope and collapse Patient is coming in today after a syncopal episode at home during which she had bilateral arm jerking.  Patient has no recollection of the event and was confused for 5 to 10 minutes afterwards.  She is back to her baseline at this time.  No prior history of cardiac or neurological deficits.  No electrolyte abnormalities to explain.  Patient notes that she has missed her clonazepam for 24 hours now, however it would be unexpected to cause a seizure in such a short window.  She denies any alcohol or nonprescribed drug use.  She is on Wellbutrin.  No suspicion at this time for infection although chest x-ray shows possible bibasilar opacities.  She denies any shortness of breath or cough, so most likely atelectasis.  - Neurology consulted; appreciate their recommendations - Given patient is at baseline, will touch base with neurology prior to ordering EEG - One-time dose of Ativan 1 mg due to possible benzo withdrawal - Restart home clonazepam this evening - MRI brain ordered - Echocardiogram - Telemetry monitoring - UDS pending  Bipolar mood disorder (HCC) - Restart home Seroquel and amitriptyline - Restart home clonazepam - Hold home Wellbutrin in the setting of syncope of unknown etiology with some  suspicion for seizure  Chronic abdominal pain Patient states she has had chronic abdominal pain for the last 2 months after having a hernia repair after developing SBO.  She has not followed up with her surgeon since then.  She states the pain is usually in bilateral lower quadrants with radiation up into the periumbilical area.  - Will consider CT of the abdomen.  No suspicion at this time for underlying infection, however that could certainly be playing a role in patient's presentation      {Tip this will not be part of the note when signed Body mass index is 22.97 kg/m. , ,  (Optional):26781}  {(NOTE) Pain control PDMP Statment (Optional):26782} Consultants: *** Procedures performed: ***  Disposition: {Plan; Disposition:26390} Diet recommendation:  {Diet_Plan:26776} DISCHARGE MEDICATION: Allergies as of 10/16/2022       Reactions   Penicillins Anaphylaxis, Swelling   Aspirin Other (See Comments)   Gastritis Other Reaction(s): GI Upset (intolerance)   Codeine Hives       Hydrocodone Hives        Medication List     TAKE these medications    acetaminophen 500 MG tablet Commonly known as: TYLENOL Take 500 mg by mouth every 6 (six) hours as needed.   amitriptyline 150 MG tablet Commonly known as: ELAVIL TAKE 1 TABLET BY MOUTH EVERYDAY AT BEDTIME What changed:  how much to take how to take this when to take this additional instructions   buPROPion 150 MG 24 hr tablet Commonly known as: WELLBUTRIN XL Take 1 tablet (150 mg total) by mouth daily.  clonazePAM 0.5 MG tablet Commonly known as: KLONOPIN Take 1 tablet (0.5 mg total) by mouth 2 (two) times daily. What changed: Another medication with the same name was removed. Continue taking this medication, and follow the directions you see here.   esomeprazole 20 MG capsule Commonly known as: NEXIUM Take 20 mg by mouth daily.   oxyCODONE 5 MG immediate release tablet Commonly known as: Oxy  IR/ROXICODONE Take 5 mg by mouth every 4 (four) hours as needed for moderate pain.   QUEtiapine 400 MG tablet Commonly known as: SEROQUEL Take 1 tablet (400 mg total) by mouth 2 (two) times daily.        Discharge Exam: Filed Weights   10/15/22 1215 10/16/22 0500  Weight: 63.5 kg 62.6 kg   ***  Condition at discharge: {DC Condition:26389}  The results of significant diagnostics from this hospitalization (including imaging, microbiology, ancillary and laboratory) are listed below for reference.   Imaging Studies: ECHOCARDIOGRAM COMPLETE  Result Date: 10/16/2022    ECHOCARDIOGRAM REPORT   Patient Name:   Maria Hodge Date of Exam: 10/16/2022 Medical Rec #:  [id number]      Height:       65.0 in Accession #:    [id number]     Weight:       138.0 lb Date of Birth:  Dec 30, 1960       BSA:          1.690 m Patient Age:    62 years       BP:           130/85 mmHg Patient Gender: F              HR:           91 bpm. Exam Location:  [facility] Procedure: 2D Echo, Cardiac Doppler and Color Doppler Indications:     Syncope R55  History:         Patient has no prior history of Echocardiogram examinations.                  Signs/Symptoms:Murmur; Risk Factors:Hypertension.  Sonographer:     Sherrie Sport Referring Phys:  [id number] Jose Persia Diagnosing Phys: Ida Rogue MD IMPRESSIONS  1. Left ventricular ejection fraction, by estimation, is 60 to 65%. The left ventricle has normal function. The left ventricle has no regional wall motion abnormalities. Left ventricular diastolic parameters are consistent with Grade I diastolic dysfunction (impaired relaxation).  2. Right ventricular systolic function is normal. The right ventricular size is normal. There is normal pulmonary artery systolic pressure. The estimated right ventricular systolic pressure is [id number] mmHg.  3. The mitral valve is normal in structure. Mild mitral valve regurgitation. No evidence of mitral stenosis.  4. The aortic valve has an indeterminant  number of cusps. Aortic valve regurgitation is not visualized. No aortic stenosis is present.  5. The inferior vena cava is normal in size with greater than 50% respiratory variability, suggesting right atrial pressure of 3 mmHg. FINDINGS  Left Ventricle: Left ventricular ejection fraction, by estimation, is 60 to 65%. The left ventricle has normal function. The left ventricle has no regional wall motion abnormalities. The left ventricular internal cavity size was normal in size. There is  no left ventricular hypertrophy. Left ventricular diastolic parameters are consistent with Grade I diastolic dysfunction (impaired relaxation). Right Ventricle: The right ventricular size is normal. No increase in right ventricular wall thickness. Right ventricular systolic function is normal. There is normal pulmonary artery systolic  pressure. The tricuspid regurgitant velocity is 2.47 m/s, and  with an assumed right atrial pressure of 5 mmHg, the estimated right ventricular systolic pressure is [id number] mmHg. Left Atrium: Left atrial size was normal in size. Right Atrium: Right atrial size was normal in size. Pericardium: There is no evidence of pericardial effusion. Mitral Valve: The mitral valve is normal in structure. Mild mitral valve regurgitation. No evidence of mitral valve stenosis. Tricuspid Valve: The tricuspid valve is normal in structure. Tricuspid valve regurgitation is mild . No evidence of tricuspid stenosis. Aortic Valve: The aortic valve has an indeterminant number of cusps. Aortic valve regurgitation is not visualized. No aortic stenosis is present. Aortic valve mean gradient measures 3.0 mmHg. Aortic valve peak gradient measures 6.0 mmHg. Aortic valve area, by VTI measures 3.66 cm. Pulmonic Valve: The pulmonic valve was normal in structure. Pulmonic valve regurgitation is not visualized. No evidence of pulmonic stenosis. Aorta: The aortic root is normal in size and structure. Venous: The inferior vena cava is  normal in size with greater than 50% respiratory variability, suggesting right atrial pressure of 3 mmHg. IAS/Shunts: No atrial level shunt detected by color flow Doppler.  LEFT VENTRICLE PLAX 2D LVIDd:         3.70 cm   Diastology LVIDs:         2.40 cm   LV e' medial:    9.79 cm/s LV PW:         1.10 cm   LV E/e' medial:  10.5 LV IVS:        1.00 cm   LV e' lateral:   11.70 cm/s LVOT diam:     2.00 cm   LV E/e' lateral: 8.8 LV SV:         66 LV SV Index:   39 LVOT Area:     3.14 cm  RIGHT VENTRICLE RV Basal diam:  3.00 cm RV Mid diam:    2.90 cm LEFT ATRIUM             Index        RIGHT ATRIUM          Index LA diam:        3.10 cm 1.83 cm/m   RA Area:     7.69 cm LA Vol (A2C):   27.8 ml 16.45 ml/m  RA Volume:   13.80 ml 8.17 ml/m LA Vol (A4C):   10.7 ml 6.33 ml/m LA Biplane Vol: 18.1 ml 10.71 ml/m  AORTIC VALVE AV Area (Vmax):    3.01 cm AV Area (Vmean):   2.84 cm AV Area (VTI):     3.66 cm AV Vmax:           122.00 cm/s AV Vmean:          82.800 cm/s AV VTI:            0.181 m AV Peak Grad:      6.0 mmHg AV Mean Grad:      3.0 mmHg LVOT Vmax:         117.00 cm/s LVOT Vmean:        74.800 cm/s LVOT VTI:          0.211 m LVOT/AV VTI ratio: 1.17  AORTA Ao Root diam: 2.80 cm MITRAL VALVE                TRICUSPID VALVE MV Area (PHT): 4.41 cm     TR Peak grad:   24.4 mmHg MV Decel Time:  172 msec     TR Vmax:        247.00 cm/s MV E velocity: 103.00 cm/s MV A velocity: 90.10 cm/s   SHUNTS MV E/A ratio:  1.14         Systemic VTI:  0.21 m                             Systemic Diam: 2.00 cm Ida Rogue MD Electronically signed by Ida Rogue MD Signature Date/Time: 10/16/2022/2:52:36 PM    Final    MR BRAIN WO CONTRAST  Result Date: 10/15/2022 CLINICAL DATA:  Seizure, new-onset, no history of trauma EXAM: MRI HEAD WITHOUT CONTRAST TECHNIQUE: Multiplanar, multiecho pulse sequences of the brain and surrounding structures were obtained without intravenous contrast. COMPARISON:  CT head from the same day.  FINDINGS: Brain: No acute infarction, hemorrhage, hydrocephalus, extra-axial collection or mass lesion. Mild for age scattered T2/FLAIR hyperintensities in the white matter, nonspecific but compatible with chronic microvascular ischemic disease. Vascular: Major arterial flow voids are maintained at the skull base. Skull and upper cervical spine: Normal marrow signal. Sinuses/Orbits: Clear sinuses.  No acute orbital findings Other: No mastoid effusions. IMPRESSION: Normal brain MRI for patient age.  No acute abnormality. Electronically Signed   By: Margaretha Sheffield M.D.   On: 10/15/2022 17:58   Portable Chest 1 View  Result Date: 10/15/2022 CLINICAL DATA:  Seizure-like activity EXAM: PORTABLE CHEST 1 VIEW COMPARISON:  Chest x-ray 08/15/2022 FINDINGS: There some strandy patchy opacities in the lung bases. There is no pleural effusion or pneumothorax. The cardiomediastinal silhouette is within normal limits. No acute fractures are seen. IMPRESSION: Strandy patchy opacities in the lung bases may represent atelectasis or infection. Electronically Signed   By: Ronney Asters M.D.   On: 10/15/2022 16:46   CT Head Wo Contrast  Result Date: 10/15/2022 CLINICAL DATA:  Seizure, new-onset, no history of trauma EXAM: CT HEAD WITHOUT CONTRAST TECHNIQUE: Contiguous axial images were obtained from the base of the skull through the vertex without intravenous contrast. RADIATION DOSE REDUCTION: This exam was performed according to the departmental dose-optimization program which includes automated exposure control, adjustment of the mA and/or kV according to patient size and/or use of iterative reconstruction technique. COMPARISON:  07/03/2013 FINDINGS: Brain: No evidence of acute infarction, hemorrhage, hydrocephalus, extra-axial collection or mass lesion/mass effect. Vascular: No hyperdense vessel or unexpected calcification. Skull: Normal. Negative for fracture or focal lesion. Sinuses/Orbits: No acute finding. IMPRESSION:  No acute intracranial process. Electronically Signed   By: Sammie Bench M.D.   On: 10/15/2022 12:46    Microbiology: Results for orders placed or performed during the hospital encounter of 08/14/22  Resp panel by RT-PCR (RSV, Flu A&B, Covid) Anterior Nasal Swab     Status: None   Collection Time: 08/14/22 10:46 PM   Specimen: Anterior Nasal Swab  Result Value Ref Range Status   SARS Coronavirus 2 by RT PCR NEGATIVE NEGATIVE Final    Comment: (NOTE) SARS-CoV-2 target nucleic acids are NOT DETECTED.  The SARS-CoV-2 RNA is generally detectable in upper respiratory specimens during the acute phase of infection. The lowest concentration of SARS-CoV-2 viral copies this assay can detect is 138 copies/mL. A negative result does not preclude SARS-Cov-2 infection and should not be used as the sole basis for treatment or other patient management decisions. A negative result may occur with  improper specimen collection/handling, submission of specimen other than nasopharyngeal swab, presence of viral mutation(s) within  the areas targeted by this assay, and inadequate number of viral copies(<138 copies/mL). A negative result must be combined with clinical observations, patient history, and epidemiological information. The expected result is Negative.  Fact Sheet for Patients:  EntrepreneurPulse.com.au  Fact Sheet for Healthcare Providers:  IncredibleEmployment.be  This test is no t yet approved or cleared by the [place] FDA and  has been authorized for detection and/or diagnosis of SARS-CoV-2 by FDA under an Emergency Use Authorization (EUA). This EUA will remain  in effect (meaning this test can be used) for the duration of the COVID-19 declaration under Section 564(b)(1) of the Act, 21 U.S.C.section 360bbb-3(b)(1), unless the authorization is terminated  or revoked sooner.       Influenza A by PCR NEGATIVE NEGATIVE Final   Influenza B by PCR  NEGATIVE NEGATIVE Final    Comment: (NOTE) The Xpert Xpress SARS-CoV-2/FLU/RSV plus assay is intended as an aid in the diagnosis of influenza from Nasopharyngeal swab specimens and should not be used as a sole basis for treatment. Nasal washings and aspirates are unacceptable for Xpert Xpress SARS-CoV-2/FLU/RSV testing.  Fact Sheet for Patients: EntrepreneurPulse.com.au  Fact Sheet for Healthcare Providers: IncredibleEmployment.be  This test is not yet approved or cleared by the [place] FDA and has been authorized for detection and/or diagnosis of SARS-CoV-2 by FDA under an Emergency Use Authorization (EUA). This EUA will remain in effect (meaning this test can be used) for the duration of the COVID-19 declaration under Section 564(b)(1) of the Act, 21 U.S.C. section 360bbb-3(b)(1), unless the authorization is terminated or revoked.     Resp Syncytial Virus by PCR NEGATIVE NEGATIVE Final    Comment: (NOTE) Fact Sheet for Patients: EntrepreneurPulse.com.au  Fact Sheet for Healthcare Providers: IncredibleEmployment.be  This test is not yet approved or cleared by the [place] FDA and has been authorized for detection and/or diagnosis of SARS-CoV-2 by FDA under an Emergency Use Authorization (EUA). This EUA will remain in effect (meaning this test can be used) for the duration of the COVID-19 declaration under Section 564(b)(1) of the Act, 21 U.S.C. section 360bbb-3(b)(1), unless the authorization is terminated or revoked.  Performed at [facility], [address] [place], [place] [address]     Labs: CBC: Recent Labs  Lab 10/15/22 1215 10/16/22 0216  WBC 6.9 6.6  NEUTROABS 3.9 3.3  HGB 13.8 12.8  HCT 42.5 39.5  MCV 88.5 87.8  PLT 198 [id number]   Basic Metabolic Panel: Recent Labs  Lab 10/15/22 1215 10/16/22 0216  NA 137 133*  K 4.1 3.2*  CL 105 103  CO2 23 23  GLUCOSE  107* 91  BUN 18 10  CREATININE 0.84 0.61  CALCIUM 8.5* 8.4*  MG 2.4  --    Liver Function Tests: Recent Labs  Lab 10/15/22 1215 10/16/22 0216  AST 24 16  ALT 18 16  ALKPHOS 67 65  BILITOT 0.5 0.6  PROT 7.2 6.8  ALBUMIN 3.6 3.3*   CBG: No results for input(s): "GLUCAP" in the last 168 hours.  Discharge time spent: {LESS THAN/GREATER [id number] 30 minutes.  Signed: Annita Brod, MD [facility] 10/16/2022

## 2022-10-17 NOTE — Hospital Course (Signed)
Patient is a 62 year old female with past medical history of generalized anxiety disorder, bipolar disorder and hypertension who presented to the emergency room after a seizure-like event.  Patient states she was in her normal state of health with no previous issues and no warning when all of a sudden, she slumped to the ground and had some generalized shaking.  This was witnessed by her husband.  Patient has a previous history of reported seizure.  She does not recall any of these events, only she recalls 1 minute that she was watching TV and the next minute, she was on the ground surrounded by EMS.  Patient was brought in for further evaluation.  No further seizure-like events occurred en route by EMS or the emergency room.  Lab work in the emergency room and CT scan of the head were unremarkable.  Vital signs were stable.  Patient brought in for further evaluation.

## 2022-10-22 DIAGNOSIS — M7989 Other specified soft tissue disorders: Secondary | ICD-10-CM | POA: Diagnosis not present

## 2022-10-22 DIAGNOSIS — S9002XA Contusion of left ankle, initial encounter: Secondary | ICD-10-CM | POA: Diagnosis not present

## 2022-10-22 DIAGNOSIS — S99922A Unspecified injury of left foot, initial encounter: Secondary | ICD-10-CM | POA: Diagnosis not present

## 2022-10-22 DIAGNOSIS — S99912A Unspecified injury of left ankle, initial encounter: Secondary | ICD-10-CM | POA: Diagnosis not present

## 2022-11-17 ENCOUNTER — Telehealth (INDEPENDENT_AMBULATORY_CARE_PROVIDER_SITE_OTHER): Payer: Medicare HMO | Admitting: Psychiatry

## 2022-11-17 DIAGNOSIS — F3162 Bipolar disorder, current episode mixed, moderate: Secondary | ICD-10-CM | POA: Diagnosis not present

## 2022-11-17 NOTE — Progress Notes (Signed)
Virtual Visit via Telephone Note  I connected with SEATTLE NUSBAUM on 11/17/22 at  2:00 PM EDT by telephone and verified that I am speaking with the correct person using two identifiers.  Location: Patient: Home Provider: Hospital   I discussed the limitations, risks, security and privacy concerns of performing an evaluation and management service by telephone and the availability of in person appointments. I also discussed with the patient that there may be a patient responsible charge related to this service. The patient expressed understanding and agreed to proceed.   History of Present Illness: Patient reached by telephone.  Identity established.  62 year old woman with a history of chronic mood instability.  Patient has no new complaints.  Tells me about the medical problems she has had recently including surgery for a hernia.  Feeling anxious about it still also anxious about her husband's chronic medical problems.  No suicidal thought no psychotic symptoms    Observations/Objective: Alert and oriented.  Denies suicidal or homicidal thought.  Denies psychotic symptoms.   Assessment and Plan: Supportive counseling and therapy review of medication management.  Refill medicine.  Patient was informed that I will be leaving the practice in 3 months and that she will have plenty of refills but will need to be contacted by the office or get in touch with them about making sure she has follow-up treatment either with one of our other providers or another physician.   Follow Up Instructions:    I discussed the assessment and treatment plan with the patient. The patient was provided an opportunity to ask questions and all were answered. The patient agreed with the plan and demonstrated an understanding of the instructions.   The patient was advised to call back or seek an in-person evaluation if the symptoms worsen or if the condition fails to improve as anticipated.  I provided 20 minutes of  non-face-to-face time during this encounter.   Alethia Berthold, MD

## 2022-11-30 ENCOUNTER — Telehealth: Payer: Self-pay

## 2022-11-30 NOTE — Telephone Encounter (Signed)
pt left a message that she needed refill on the clonazepam.

## 2022-11-30 NOTE — Telephone Encounter (Signed)
called patient to give the news that she is too early to reorder medication but she was not home.

## 2022-11-30 NOTE — Telephone Encounter (Signed)
called pharmacy because patient should have enough refills until June. according to the pharmaist she is getting to early. states that patient picked up on 11-13-22 and the earlies she can get again is 12-11-22.

## 2022-12-01 NOTE — Telephone Encounter (Signed)
pt notified that rx was to early for her to order. she last picked up on 3-15 per the pharmacy. she states that she takes 3 a day. pt was told that she should only take 2 as directed.   I sent Dr. Weber Cooks and message about patient states that she takes 3 daily.  Per Dr. Weber Cooks he is not aware of that and that he will review his notes.

## 2023-01-28 ENCOUNTER — Other Ambulatory Visit: Payer: Self-pay | Admitting: Psychiatry

## 2023-01-28 DIAGNOSIS — F411 Generalized anxiety disorder: Secondary | ICD-10-CM

## 2023-01-28 MED ORDER — AMITRIPTYLINE HCL 150 MG PO TABS: ORAL_TABLET | ORAL | 1 refills | Status: DC

## 2023-02-04 ENCOUNTER — Other Ambulatory Visit: Payer: Self-pay | Admitting: Psychiatry

## 2023-02-04 MED ORDER — CLONAZEPAM 0.5 MG PO TABS: 0.5000 mg | ORAL_TABLET | Freq: Two times a day (BID) | ORAL | 5 refills | Status: DC

## 2023-02-04 NOTE — Progress Notes (Signed)
Refill clonazepam

## 2023-03-18 ENCOUNTER — Ambulatory Visit: Payer: Medicare HMO | Admitting: Psychiatry

## 2023-03-23 ENCOUNTER — Telehealth: Payer: Self-pay

## 2023-03-23 NOTE — Telephone Encounter (Signed)
Could you please inquire at the pharmacy when the prescription was last filled? I reviewed the previous order, but Dr. Shary Key note didn't clarify if it's currently an active medication. Additionally, I haven't met this patient before.  buPROPion (WELLBUTRIN XL) 150 MG 24 hr tablet (Discontinued) 30 tablet 5 08/18/2022 11/17/2022  Sig - Route: Take 1 tablet (150 mg total) by mouth daily. - Oral  Sent to pharmacy as: buPROPion (WELLBUTRIN XL) 150 MG 24 hr tablet  E-Prescribing Status: Receipt confirmed by pharmacy (08/18/2022  3:26 PM EST)

## 2023-03-23 NOTE — Telephone Encounter (Signed)
received fax requesting a refill on the bupropion hcl xl 150mg .   Appt 8-12  last seen dr. Toni Amend 11-17-22

## 2023-03-25 ENCOUNTER — Other Ambulatory Visit: Payer: Self-pay | Admitting: Psychiatry

## 2023-03-25 DIAGNOSIS — F411 Generalized anxiety disorder: Secondary | ICD-10-CM

## 2023-03-25 MED ORDER — AMITRIPTYLINE HCL 150 MG PO TABS: 150.0000 mg | ORAL_TABLET | Freq: Every day | ORAL | 0 refills | Status: DC

## 2023-03-25 NOTE — Telephone Encounter (Signed)
pt called back she states she out and needs enough medication amitriptyline to get to her appt. she states her pcp will not give her the medication

## 2023-03-25 NOTE — Telephone Encounter (Signed)
Ordered amitriptyline

## 2023-03-26 NOTE — Telephone Encounter (Signed)
Pt.notified
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
## 2023-04-12 ENCOUNTER — Ambulatory Visit (INDEPENDENT_AMBULATORY_CARE_PROVIDER_SITE_OTHER): Payer: Medicare HMO | Admitting: Psychiatry

## 2023-04-12 DIAGNOSIS — Z91199 Patient's noncompliance with other medical treatment and regimen due to unspecified reason: Secondary | ICD-10-CM

## 2023-04-13 NOTE — Progress Notes (Signed)
No show
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
## 2023-06-14 ENCOUNTER — Ambulatory Visit (INDEPENDENT_AMBULATORY_CARE_PROVIDER_SITE_OTHER): Payer: Medicare HMO | Admitting: Psychiatry

## 2023-06-14 ENCOUNTER — Encounter: Payer: Self-pay | Admitting: Psychiatry

## 2023-06-14 VITALS — BP 154/103 | HR 139 | Temp 96.2°F | Ht 65.0 in | Wt 147.0 lb

## 2023-06-14 DIAGNOSIS — F4310 Post-traumatic stress disorder, unspecified: Secondary | ICD-10-CM

## 2023-06-14 DIAGNOSIS — G4700 Insomnia, unspecified: Secondary | ICD-10-CM | POA: Diagnosis not present

## 2023-06-14 DIAGNOSIS — F419 Anxiety disorder, unspecified: Secondary | ICD-10-CM | POA: Diagnosis not present

## 2023-06-14 DIAGNOSIS — F0630 Mood disorder due to known physiological condition, unspecified: Secondary | ICD-10-CM

## 2023-06-14 MED ORDER — NORTRIPTYLINE HCL 25 MG PO CAPS: 25.0000 mg | ORAL_CAPSULE | Freq: Every day | ORAL | 1 refills | Status: DC

## 2023-06-14 NOTE — Patient Instructions (Addendum)
Start nortriptyline 25 mg at night  Continue quetiapine 400 mg twice a day Continue clonazepam 0.5 mg twice a day as needed for anxiety  Referral for sleep evaluation Next appointment: 11/21 at 11:30

## 2023-06-15 ENCOUNTER — Encounter: Payer: Self-pay | Admitting: Student in an Organized Health Care Education/Training Program

## 2023-06-21 ENCOUNTER — Other Ambulatory Visit: Payer: Self-pay | Admitting: Psychiatry

## 2023-06-21 ENCOUNTER — Telehealth: Payer: Self-pay

## 2023-06-21 MED ORDER — AMITRIPTYLINE HCL 50 MG PO TABS: ORAL_TABLET | ORAL | 0 refills | Status: DC

## 2023-06-21 MED ORDER — AMITRIPTYLINE HCL 150 MG PO TABS: 150.0000 mg | ORAL_TABLET | Freq: Every day | ORAL | 1 refills | Status: DC

## 2023-06-21 NOTE — Telephone Encounter (Signed)
pt called states that you changed her medication from the amitriptyline to the nortriptyline. she can not take it is making her sick on her stomach and she wants to go back on the amitriptyline. pt was seen on 10-14 next appt 11-21.

## 2023-06-21 NOTE — Telephone Encounter (Signed)
pt notified that rx was sent and how to take medication.

## 2023-06-21 NOTE — Telephone Encounter (Signed)
Please advise her to start amitriptyline at 50 mg at night for 3 days, then increase to 100 mg at night for 3 days, and finally 150 mg at night. The order has been sent. Also, advise her to discontinue nortriptyline.  - please contact [facility] in [place], and cancel nortriptyline order.

## 2023-07-01 ENCOUNTER — Ambulatory Visit: Payer: Medicare HMO | Admitting: Psychiatry

## 2023-07-13 ENCOUNTER — Encounter: Payer: Self-pay | Admitting: Family Medicine

## 2023-07-13 ENCOUNTER — Ambulatory Visit (INDEPENDENT_AMBULATORY_CARE_PROVIDER_SITE_OTHER): Payer: Medicare HMO | Admitting: Family Medicine

## 2023-07-13 VITALS — BP 144/92 | HR 122 | Resp 18 | Ht 65.0 in | Wt 145.0 lb

## 2023-07-13 DIAGNOSIS — M25571 Pain in right ankle and joints of right foot: Secondary | ICD-10-CM | POA: Diagnosis not present

## 2023-07-13 DIAGNOSIS — G8929 Other chronic pain: Secondary | ICD-10-CM

## 2023-07-13 DIAGNOSIS — Z23 Encounter for immunization: Secondary | ICD-10-CM | POA: Diagnosis not present

## 2023-07-13 DIAGNOSIS — F419 Anxiety disorder, unspecified: Secondary | ICD-10-CM | POA: Diagnosis not present

## 2023-07-13 DIAGNOSIS — M5442 Lumbago with sciatica, left side: Secondary | ICD-10-CM | POA: Diagnosis not present

## 2023-07-13 DIAGNOSIS — Z0001 Encounter for general adult medical examination with abnormal findings: Secondary | ICD-10-CM

## 2023-07-13 DIAGNOSIS — Z716 Tobacco abuse counseling: Secondary | ICD-10-CM

## 2023-07-13 DIAGNOSIS — I10 Essential (primary) hypertension: Secondary | ICD-10-CM | POA: Diagnosis not present

## 2023-07-13 DIAGNOSIS — Z1159 Encounter for screening for other viral diseases: Secondary | ICD-10-CM

## 2023-07-13 DIAGNOSIS — Z0000 Encounter for general adult medical examination without abnormal findings: Secondary | ICD-10-CM

## 2023-07-13 DIAGNOSIS — Z79899 Other long term (current) drug therapy: Secondary | ICD-10-CM

## 2023-07-13 DIAGNOSIS — H6123 Impacted cerumen, bilateral: Secondary | ICD-10-CM | POA: Diagnosis not present

## 2023-07-13 DIAGNOSIS — Z1231 Encounter for screening mammogram for malignant neoplasm of breast: Secondary | ICD-10-CM

## 2023-07-13 DIAGNOSIS — M25572 Pain in left ankle and joints of left foot: Secondary | ICD-10-CM

## 2023-07-13 DIAGNOSIS — I4711 Inappropriate sinus tachycardia, so stated: Secondary | ICD-10-CM | POA: Diagnosis not present

## 2023-07-13 MED ORDER — OXYCODONE-ACETAMINOPHEN 5-325 MG PO TABS: 1.0000 | ORAL_TABLET | Freq: Four times a day (QID) | ORAL | 0 refills | Status: AC | PRN

## 2023-07-13 MED ORDER — NICOTINE POLACRILEX 2 MG MT LOZG: 2.0000 mg | LOZENGE | OROMUCOSAL | 0 refills | Status: AC | PRN

## 2023-07-13 MED ORDER — LOSARTAN POTASSIUM 25 MG PO TABS: 25.0000 mg | ORAL_TABLET | Freq: Every day | ORAL | 1 refills | Status: DC

## 2023-07-13 MED ORDER — DEBROX 6.5 % OT SOLN: 5.0000 [drp] | Freq: Two times a day (BID) | OTIC | 0 refills | Status: AC

## 2023-07-13 NOTE — Patient Instructions (Addendum)
Recommended vaccines:  - Shingrix (shingles), Tdap (tetanus), and Covid booster  Check your blood pressure once daily, and any time you have concerning symptoms like headache, chest pain, dizziness, shortness of breath, or vision changes.   Our goal is less than 130/80.  To appropriately check your blood pressure, make sure you do the following:  1) Avoid caffeine, exercise, or tobacco products for 30 minutes before checking. Empty your bladder. 2) Sit with your back supported in a flat-backed chair. Rest your arm on something flat (arm of the chair, table, etc). 3) Sit still with your feet flat on the floor, resting, for at least 5 minutes.  4) Check your blood pressure. Take 1-2 readings.  5) Write down these readings and bring with you to any provider appointments.  Bring your home blood pressure machine with you to a provider's office for accuracy comparison at least once a year.   Make sure you take your blood pressure medications before you come to any office visit, even if you were asked to fast for labs.

## 2023-07-13 NOTE — Progress Notes (Signed)
New patient visit   Patient: Maria Hodge   DOB: 24-Jan-1961   62 y.o. Female  MRN: [id number] Visit Date: 07/13/2023  Today's healthcare provider: Sherlyn Hay, DO   Chief Complaint  Patient presents with   New Patient (Initial Visit)   Subjective    Maria Hodge is a 62 y.o. female who presents today as a new patient to establish care.  HPI  The patient, with a history of multiple medical conditions, presented for a routine primary care visit after a significant period of not having a primary care physician due to her spouse's cancer diagnosis.   She has a history of small bowel obstruction, hypertension, and multiple surgeries, including a hernia repair which she reported as life-saving.  She reports her blood pressure is usually well-controlled.  The patient has a history of two ankle fractures, one occurring three years ago and the other a couple of years prior.   - has ongoing pain in both ankles and lower back, which has been persistent since her surgeries.   - also reports a lump in her lower back that has been present since her last hospitalization.  Nicotine dependence  - started smoking at age 37, with a brief cessation period during pregnancy at age 11.    - She reports smoking up to a pack a day for most of her life since 72.  The patient also reported a history of a heart murmur diagnosed at age 34, which she reported has not resolved.   Anxiety - currently managed with quetiapine and amitriptyline  - experiences shortness of breath during panic attacks.  The patient has been using over-the-counter Nexium daily for an unspecified period due to gastric discomfort. She also reported a dry mouth, for which she has not tried any treatments.  The patient reported a fall in December of the previous year, which resulted in significant bruising but did not seek medical attention.   The patient has not had a mammogram or Pap smear in several years due to discomfort  and pain during the procedures. She also reported not having a colonoscopy. She has not been screened for Hepatitis C or received the shingles vaccine, tetanus vaccine, or COVID booster. Overdue for Pap smear  - last one 1990; no hx abnormals  The patient's medications include quetiapine, Tylenol, amitriptyline, and Klonopin. She reported some uncertainty about her future medication plan due to changes in her behavioral health provider.  She states she doesn't drive but husband drove her because she couldn't leave him at home   Past Medical History:  Diagnosis Date   Anxiety    Arrhythmia    Arthritis    hands   Depression    GERD (gastroesophageal reflux disease)    Heart murmur    Hypertension    Wears dentures    full upper   Past Surgical History:  Procedure Laterality Date   BACK SURGERY  2010   ORIF ANKLE FRACTURE Left 11/09/2017   Procedure: OPEN REDUCTION INTERNAL FIXATION (ORIF) ANKLE FRACTURE WITH POSSIBLE SYNDESMOSIS REPAIR;  Surgeon: Signa Kell, MD;  Location: [facility];  Service: Orthopedics;  Laterality: Left;  GENERAL WITH NERVE BLOCK ALEX SYNDEAMOSIS TIGHTROPE MARK ANKLE FRACTURE PLATES  C ARM  PLASTER SPLINT   ORIF ANKLE FRACTURE Right 12/24/2021   Procedure: OPEN REDUCTION INTERNAL FIXATION (ORIF) ANKLE FRACTURE;  Surgeon: Gwyneth Revels, DPM;  Location: [facility];  Service: Podiatry;  Laterality: Right;  Anesthesia: General with pop  liteal   STERIOD INJECTION  12/21/2011   Procedure: MINOR STEROID INJECTION;  Surgeon: Mat Carne, MD;  Location: [facility];  Service: Orthopedics;  Laterality: Right;  Right L3-4 transforaminal epidural steroid injection, attempted right L4-5 epidural steroid injection( 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)   Family Status  Relation Name Status   Mother  Deceased   Father  Deceased   Sister  Alive   Sister  Deceased   Sister  Deceased   Sister  Deceased   Brother  Deceased  No  partnership data on file   Family History  Problem Relation Age of Onset   Heart failure Mother    Hypertension Mother    Depression Mother    Anxiety disorder Mother    Cancer Father    Rheum arthritis Father    Depression Sister    Heart failure Sister    Rheum arthritis Sister    Heart failure Sister    Cancer Sister    Social History   Socioeconomic History   Marital status: Married    Spouse name: Not on file   Number of children: Not on file   Years of education: Not on file   Highest education level: Not on file  Occupational History   Not on file  Tobacco Use   Smoking status: Every Day    Current packs/day: 0.50    Average packs/day: 0.5 packs/day for 31.1 years (15.6 ttl pk-yrs)    Types: Cigarettes    Start date: 06/05/1992   Smokeless tobacco: Never   Tobacco comments:    down to 1/3 PPD  Vaping Use   Vaping status: Never Used  Substance and Sexual Activity   Alcohol use: No    Alcohol/week: 0.0 standard drinks of alcohol   Drug use: No   Sexual activity: Not Currently    Birth control/protection: None  Other Topics Concern   Not on file  Social History Narrative   Not on file   Social Determinants of Health   Financial Resource Strain: Not on file  Food Insecurity: No Food Insecurity (07/13/2023)   Hunger Vital Sign    Worried About Running Out of Food in the Last Year: Never true    Ran Out of Food in the Last Year: Never true  Transportation Needs: No Transportation Needs (07/13/2023)   PRAPARE - Administrator, Civil Service (Medical): No    Lack of Transportation (Non-Medical): No  Physical Activity: Not on file  Stress: Not on file  Social Connections: Not on file   Outpatient Medications Prior to Visit  Medication Sig   acetaminophen (TYLENOL) 500 MG tablet Take 500 mg by mouth every 6 (six) hours as needed.   amitriptyline (ELAVIL) 150 MG tablet Take 1 tablet (150 mg total) by mouth at bedtime. Start after taking 100 mg  at night for 3 days   clonazePAM (KLONOPIN) 0.5 MG tablet Take 1 tablet (0.5 mg total) by mouth 2 (two) times daily.   esomeprazole (NEXIUM) 20 MG capsule Take 20 mg by mouth daily.   QUEtiapine (SEROQUEL) 400 MG tablet Take 1 tablet (400 mg total) by mouth 2 (two) times daily.   [DISCONTINUED] nortriptyline (PAMELOR) 25 MG capsule Take 25 mg by mouth at bedtime.   [DISCONTINUED] amitriptyline (ELAVIL) 50 MG tablet Take 1 tablet (50 mg total) by mouth at bedtime for 3 days, THEN 2 tablets (100 mg total) at bedtime for 3 days.   No facility-administered medications  prior to visit.   Allergies  Allergen Reactions   Penicillins Anaphylaxis and Swelling   Aspirin Other (See Comments)    Gastritis  Other Reaction(s): GI Upset (intolerance)   Codeine Hives        Hydrocodone Hives    Immunization History  Administered Date(s) Administered   Influenza, Seasonal, Injecte, Preservative Fre 07/13/2023   PNEUMOCOCCAL CONJUGATE-20 11/15/2021    Health Maintenance  Topic Date Due   Medicare Annual Wellness (AWV)  Never done   COVID-19 Vaccine (1) Never done   Hepatitis C Screening  Never done   DTaP/Tdap/Td (1 - Tdap) Never done   Zoster Vaccines- Shingrix (1 of 2) Never done   Cervical Cancer Screening (HPV/Pap Cotest)  Never done   Colonoscopy  Never done   MAMMOGRAM  Never done   INFLUENZA VACCINE  Completed   HIV Screening  Completed   HPV VACCINES  Aged Out    Patient Care Team: Emilija Bohman, Monico Blitz, DO as PCP - General (Family Medicine)  Review of Systems  Constitutional:  Negative for appetite change, chills, fatigue and fever.  Respiratory:  Positive for shortness of breath. Negative for chest tightness.   Cardiovascular:  Positive for palpitations. Negative for chest pain.  Gastrointestinal:  Negative for abdominal pain, nausea and vomiting.  Neurological:  Negative for dizziness and weakness.        Objective    BP (!) 144/92 (BP Location: Left Arm, Patient Position:  Sitting, Cuff Size: Normal)   Pulse (!) 122   Resp 18   Ht 5\' 5"  (1.651 m)   Wt 145 lb (65.8 kg)   SpO2 96%   BMI 24.13 kg/m     Physical Exam Vitals and nursing note reviewed.  Constitutional:      General: She is awake.     Appearance: Normal appearance.  HENT:     Head: Normocephalic and atraumatic.     Right Ear: Ear canal and external ear normal. There is impacted cerumen.     Left Ear: Ear canal and external ear normal. There is impacted cerumen.     Nose: Nose normal.     Mouth/Throat:     Mouth: Mucous membranes are moist.     Pharynx: Oropharynx is clear. No oropharyngeal exudate or posterior oropharyngeal erythema.  Eyes:     General: No scleral icterus.    Extraocular Movements: Extraocular movements intact.     Conjunctiva/sclera: Conjunctivae normal.     Pupils: Pupils are equal, round, and reactive to light.  Neck:     Thyroid: No thyromegaly or thyroid tenderness.  Cardiovascular:     Rate and Rhythm: Normal rate and regular rhythm.     Pulses: Normal pulses.     Heart sounds: Normal heart sounds.  Pulmonary:     Effort: Pulmonary effort is normal. No tachypnea, bradypnea or respiratory distress.     Breath sounds: Normal breath sounds. No stridor. No wheezing, rhonchi or rales.  Abdominal:     General: Bowel sounds are normal. There is no distension.     Palpations: Abdomen is soft. There is no mass.     Tenderness: There is no abdominal tenderness. There is no guarding.     Hernia: No hernia is present.  Musculoskeletal:     Cervical back: Normal range of motion and neck supple.     Right lower leg: No edema.     Left lower leg: No edema.  Lymphadenopathy:     Cervical: No cervical adenopathy.  Skin:    General: Skin is warm and dry.  Neurological:     Mental Status: She is alert and oriented to person, place, and time. Mental status is at baseline.  Psychiatric:        Mood and Affect: Mood normal.        Behavior: Behavior normal.      Depression Screen    07/13/2023    1:16 PM 06/14/2023   10:14 AM  PHQ 2/9 Scores  PHQ - 2 Score 6   PHQ- 9 Score 23      Information is confidential and restricted. Go to Review Flowsheets to unlock data.   No results found for any visits on 07/13/23.  Assessment & Plan     Encounter for medical examination to establish care Assessment & Plan: Physical exam overall unremarkable except as noted above. -Order blood work including Vitamin B12 due to daily use of esomeprazole. -Recommend Shingles vaccine, Tetanus vaccine, and COVID booster. Advise patient to get these at a pharmacy and have records faxed to the clinic. -Recommend Pap smear due to last one being in 1990.   Orders: -     Comprehensive metabolic panel -     Lipid panel -     TSH Rfx on Abnormal to Free T4  Encounter for smoking cessation counseling Assessment & Plan: Patient has a long history of smoking, currently 1/3 pack per day. Expressed interest in quitting. -Prescribe Nicotine lozenges for smoking cessation. -Encourage patient to quit smoking and provide resources for smoking cessation.  Orders: -     Nicotine Polacrilex; Take 1 lozenge (2 mg total) by mouth as needed for smoking cessation.  Dispense: 100 tablet; Refill: 0  Inappropriate sinus tachycardia (HCC) Assessment & Plan: EKG showed sinus tachycardia with a rate of 113, PR interval 132, QRS 80, and QT/QTc 320/438, predominantly unchanged from previous EKG. Likely secondary to anxiety. Consider referral to cardiology if persistent and/or accompanied by chest pain or pressure, especially in the absence of initial anxiety. No acute concerns.  Continue to monitor.  Orders: -     EKG 12-Lead  Primary hypertension Assessment & Plan: Elevated blood pressure readings in clinic and history of high blood pressure readings in the past. Patient has inconsistent home monitoring. No symptoms of end-organ damage. -Start Losartan low dose  daily. -Advise patient to monitor blood pressure daily at home and maintain a log. -Check blood pressure in clinic in 2 weeks.  Orders: -     Losartan Potassium; Take 1 tablet (25 mg total) by mouth daily.  Dispense: 30 tablet; Refill: 1  Chronic left-sided low back pain with left-sided sciatica Assessment & Plan: History of lower back pain. No radicular symptoms. + Previous surgeries. -Short course of Tramadol for breakthrough pain. -Referral to Orthopedics for further evaluation and management. -Consider referral to Pain Management if pain persists or worsens.  Orders: -     Ambulatory referral to Orthopedic Surgery -     oxyCODONE-Acetaminophen; Take 1 tablet by mouth every 6 (six) hours as needed for up to 5 days for severe pain (pain score 7-10).  Dispense: 15 tablet; Refill: 0  Chronic pain of both ankles Assessment & Plan: History of ankle fractures. + Previous surgeries. -Short course of Tramadol for breakthrough pain. -Referral to Orthopedics for further evaluation and management. -Consider referral to Pain Management if pain persists or worsens.  Orders: -     Ambulatory referral to Orthopedic Surgery -     oxyCODONE-Acetaminophen; Take 1  tablet by mouth every 6 (six) hours as needed for up to 5 days for severe pain (pain score 7-10).  Dispense: 15 tablet; Refill: 0  Bilateral impacted cerumen Assessment & Plan: Send patient a prescription for Debrox, with instructions to started a couple days prior to her follow-up appointment for ear irrigation.  Orders: -     Debrox; Place 5 drops into both ears 2 (two) times daily. Start 3 days before your next appointment  Dispense: 15 mL; Refill: 0  Encounter for immunization -     Flu vaccine trivalent PF, 6mos and older(Flulaval,Afluria,Fluarix,Fluzone)  Immunization due  Encounter for screening mammogram for breast cancer -     3D Screening Mammogram, Left and Right; Future  High risk medication use -     Vitamin  B12  Encounter for hepatitis C screening test for low risk patient -     HCV Ab w Reflex to Quant PCR  Anxiety Assessment & Plan: High heart rate and patient reports feeling anxious. Currently on Quetiapine 400mg  twice daily and Klonopin. -Continue current medications. -Patient currently switching between behavioral health providers.    Return in about 2 weeks (around 07/27/2023) for mAWV w/nurse and HTN/ear irrigation office visit.     I discussed the assessment and treatment plan with the patient  The patient was provided an opportunity to ask questions and all were answered. The patient agreed with the plan and demonstrated an understanding of the instructions.   The patient was advised to call back or seek an in-person evaluation if the symptoms worsen or if the condition fails to improve as anticipated.  Total time was 60 minutes. That includes chart review before the visit, the actual patient visit, and time spent on documentation after the visit.    Sherlyn Hay, DO  [facility] [phone] (phone) [phone] (fax)  [facility]
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
## 2023-07-22 ENCOUNTER — Ambulatory Visit: Payer: Medicare HMO | Admitting: Psychiatry

## 2023-07-26 ENCOUNTER — Encounter: Payer: Self-pay | Admitting: Family Medicine

## 2023-07-26 ENCOUNTER — Ambulatory Visit: Payer: Medicare HMO | Admitting: Family Medicine

## 2023-07-26 DIAGNOSIS — I4711 Inappropriate sinus tachycardia, so stated: Secondary | ICD-10-CM | POA: Insufficient documentation

## 2023-07-26 DIAGNOSIS — Z716 Tobacco abuse counseling: Secondary | ICD-10-CM | POA: Insufficient documentation

## 2023-07-26 DIAGNOSIS — F419 Anxiety disorder, unspecified: Secondary | ICD-10-CM | POA: Insufficient documentation

## 2023-07-26 DIAGNOSIS — H6123 Impacted cerumen, bilateral: Secondary | ICD-10-CM | POA: Insufficient documentation

## 2023-07-26 DIAGNOSIS — G8929 Other chronic pain: Secondary | ICD-10-CM | POA: Insufficient documentation

## 2023-07-26 DIAGNOSIS — Z0000 Encounter for general adult medical examination without abnormal findings: Secondary | ICD-10-CM | POA: Insufficient documentation

## 2023-07-26 NOTE — Assessment & Plan Note (Signed)
Send patient a prescription for Debrox, with instructions to started a couple days prior to her follow-up appointment for ear irrigation.

## 2023-07-26 NOTE — Assessment & Plan Note (Addendum)
EKG showed sinus tachycardia with a rate of 113, PR interval 132, QRS 80, and QT/QTc 320/438, predominantly unchanged from previous EKG. Likely secondary to anxiety. Consider referral to cardiology if persistent and/or accompanied by chest pain or pressure, especially in the absence of initial anxiety. No acute concerns.  Continue to monitor.

## 2023-07-26 NOTE — Assessment & Plan Note (Addendum)
High heart rate and patient reports feeling anxious. Currently on Quetiapine 400mg  twice daily and Klonopin. -Continue current medications. -Patient currently switching between behavioral health providers.

## 2023-07-26 NOTE — Assessment & Plan Note (Addendum)
History of ankle fractures. + Previous surgeries. -Short course of Tramadol for breakthrough pain. -Referral to Orthopedics for further evaluation and management. -Consider referral to Pain Management if pain persists or worsens.

## 2023-07-26 NOTE — Assessment & Plan Note (Addendum)
Physical exam overall unremarkable except as noted above. -Order blood work including Vitamin B12 due to daily use of esomeprazole. -Recommend Shingles vaccine, Tetanus vaccine, and COVID booster. Advise patient to get these at a pharmacy and have records faxed to the clinic. -Recommend Pap smear due to last one being in 1990.

## 2023-07-26 NOTE — Assessment & Plan Note (Signed)
Patient has a long history of smoking, currently 1/3 pack per day. Expressed interest in quitting. -Prescribe Nicotine lozenges for smoking cessation. -Encourage patient to quit smoking and provide resources for smoking cessation.

## 2023-07-26 NOTE — Assessment & Plan Note (Signed)
History of lower back pain. No radicular symptoms. + Previous surgeries. -Short course of Tramadol for breakthrough pain. -Referral to Orthopedics for further evaluation and management. -Consider referral to Pain Management if pain persists or worsens.

## 2023-07-26 NOTE — Assessment & Plan Note (Signed)
Elevated blood pressure readings in clinic and history of high blood pressure readings in the past. Patient has inconsistent home monitoring. No symptoms of end-organ damage. -Start Losartan low dose daily. -Advise patient to monitor blood pressure daily at home and maintain a log. -Check blood pressure in clinic in 2 weeks.

## 2023-08-02 ENCOUNTER — Other Ambulatory Visit: Payer: Self-pay | Admitting: Psychiatry

## 2023-08-02 ENCOUNTER — Telehealth: Payer: Self-pay

## 2023-08-02 MED ORDER — CLONAZEPAM 0.5 MG PO TABS: 0.5000 mg | ORAL_TABLET | Freq: Two times a day (BID) | ORAL | 1 refills | Status: DC

## 2023-08-02 NOTE — Telephone Encounter (Signed)
Ordered

## 2023-08-02 NOTE — Telephone Encounter (Signed)
Pt.notified

## 2023-08-02 NOTE — Telephone Encounter (Signed)
received fax requesting a refill on the clonazepam. pt was last seen on 10-4 next appt 1-14

## 2023-08-03 ENCOUNTER — Telehealth: Payer: Self-pay | Admitting: Family Medicine

## 2023-08-03 NOTE — Telephone Encounter (Signed)
Pt is calling in because she has a referral for ortho in and she contacted the place the referral was sent to and pt says she did not have a good experience. Pt says the person that answered the phone was rude and made her feel uncomfortable. Pt says she is unable to get her medical records due to the previous doctor moving to [place]. Pt is requesting assistance with this and wants to know if the referral can be sent to another place.

## 2023-08-05 ENCOUNTER — Other Ambulatory Visit: Payer: Self-pay | Admitting: Family Medicine

## 2023-08-05 DIAGNOSIS — I10 Essential (primary) hypertension: Secondary | ICD-10-CM

## 2023-08-06 NOTE — Telephone Encounter (Signed)
Requested medication (s) are due for refill today:no  Requested medication (s) are on the active medication list: yes  Last refill:  07/13/23 #30 1 RF  Future visit scheduled: yes  Notes to clinic:  pharmacy requesting 90 day refills   Requested Prescriptions  Pending Prescriptions Disp Refills   losartan (COZAAR) 25 MG tablet [Pharmacy Med Name: LOSARTAN POTASSIUM 25 MG TAB] 90 tablet 1    Sig: Take 1 tablet (25 mg total) by mouth daily.     Cardiovascular:  Angiotensin Receptor Blockers Failed - 08/05/2023  1:31 PM      Failed - Cr in normal range and within 180 days    Creatinine  Date Value Ref Range Status  11/23/2014 0.75 mg/dL Final    Comment:    1.61-0.96 NOTE: New Reference Range  11/06/14    Creatinine, Ser  Date Value Ref Range Status  10/16/2022 0.61 0.44 - 1.00 mg/dL Final         Failed - K in normal range and within 180 days    Potassium  Date Value Ref Range Status  10/16/2022 3.2 (L) 3.5 - 5.1 mmol/L Final  11/23/2014 4.2 mmol/L Final    Comment:    3.5-5.1 NOTE: New Reference Range  11/06/14          Failed - Last BP in normal range    BP Readings from Last 1 Encounters:  07/13/23 (!) 144/92         Passed - Patient is not pregnant      Passed - Valid encounter within last 6 months    Recent Outpatient Visits           3 weeks ago Encounter for medical examination to establish care   [facility] Pardue, Monico Blitz, DO       Future Appointments             In 3 weeks Pardue, Monico Blitz, DO [facility]   In 3 weeks Pardue, Monico Blitz, DO [facility]

## 2023-08-20 ENCOUNTER — Other Ambulatory Visit: Payer: Self-pay | Admitting: Psychiatry

## 2023-08-31 ENCOUNTER — Encounter: Payer: Medicare HMO | Admitting: Family Medicine

## 2023-09-02 ENCOUNTER — Ambulatory Visit: Payer: Medicare HMO | Admitting: Family Medicine

## 2023-09-02 NOTE — Progress Notes (Deleted)
      Established patient visit   Patient: Maria Hodge   DOB: Jun 24, 1961   63 y.o. Female  MRN: [id number] Visit Date: 09/02/2023  Today's healthcare provider: LAURAINE LOISE BUOY, DO   No chief complaint on file.  Subjective    HPI Did Debrox drops before coming?  Plan to irrigate ears if so, if still indicated Recheck blood pressure today. Monitored at home? Still taking losartan  25 mg daily? Received alternative referral to orthopedics? - Referred to [facility]; will need to contact  ***  {History (Optional):23778}  Medications: Outpatient Medications Prior to Visit  Medication Sig   acetaminophen  (TYLENOL ) 500 MG tablet Take 500 mg by mouth every 6 (six) hours as needed.   amitriptyline  (ELAVIL ) 150 MG tablet Take 1 tablet (150 mg total) by mouth at bedtime.   carbamide peroxide (DEBROX) 6.5 % OTIC solution Place 5 drops into both ears 2 (two) times daily. Start 3 days before your next appointment   clonazePAM  (KLONOPIN ) 0.5 MG tablet Take 1 tablet (0.5 mg total) by mouth 2 (two) times daily.   esomeprazole (NEXIUM) 20 MG capsule Take 20 mg by mouth daily.   losartan  (COZAAR ) 25 MG tablet Take 1 tablet (25 mg total) by mouth daily.   nicotine  polacrilex (NICOTINE  MINI) 2 MG lozenge Take 1 lozenge (2 mg total) by mouth as needed for smoking cessation.   QUEtiapine  (SEROQUEL ) 400 MG tablet Take 1 tablet (400 mg total) by mouth 2 (two) times daily.   No facility-administered medications prior to visit.    Review of Systems ***  {Insert previous labs (optional):23779} {See past labs  Heme  Chem  Endocrine  Serology  Results Review (optional):1}   Objective    There were no vitals taken for this visit. {Insert last BP/Wt (optional):23777}{See vitals history (optional):1}   Physical Exam   No results found for any visits on 09/02/23.  Assessment & Plan    There are no diagnoses linked to this encounter.  ***  No follow-ups on file.      I discussed the  assessment and treatment plan with the patient  The patient was provided an opportunity to ask questions and all were answered. The patient agreed with the plan and demonstrated an understanding of the instructions.   The patient was advised to call back or seek an in-person evaluation if the symptoms worsen or if the condition fails to improve as anticipated.    LAURAINE LOISE BUOY, DO  [facility] [phone] (phone) [phone] (fax)  [facility]

## 2023-09-11 ENCOUNTER — Other Ambulatory Visit: Payer: Self-pay | Admitting: Family Medicine

## 2023-09-11 DIAGNOSIS — I10 Essential (primary) hypertension: Secondary | ICD-10-CM

## 2023-09-11 NOTE — Progress Notes (Signed)
 No show

## 2023-09-14 ENCOUNTER — Ambulatory Visit (INDEPENDENT_AMBULATORY_CARE_PROVIDER_SITE_OTHER): Payer: Medicare HMO | Admitting: Psychiatry

## 2023-09-14 DIAGNOSIS — Z91199 Patient's noncompliance with other medical treatment and regimen due to unspecified reason: Secondary | ICD-10-CM

## 2023-09-17 ENCOUNTER — Ambulatory Visit (INDEPENDENT_AMBULATORY_CARE_PROVIDER_SITE_OTHER): Payer: Medicare HMO | Admitting: Family Medicine

## 2023-09-17 ENCOUNTER — Encounter: Payer: Self-pay | Admitting: Family Medicine

## 2023-09-17 VITALS — BP 119/86 | HR 112 | Resp 20 | Ht 65.0 in | Wt 140.0 lb

## 2023-09-17 DIAGNOSIS — Z79899 Other long term (current) drug therapy: Secondary | ICD-10-CM | POA: Diagnosis not present

## 2023-09-17 DIAGNOSIS — G8929 Other chronic pain: Secondary | ICD-10-CM | POA: Diagnosis not present

## 2023-09-17 DIAGNOSIS — S060X9A Concussion with loss of consciousness of unspecified duration, initial encounter: Secondary | ICD-10-CM

## 2023-09-17 DIAGNOSIS — Z716 Tobacco abuse counseling: Secondary | ICD-10-CM | POA: Diagnosis not present

## 2023-09-17 DIAGNOSIS — R413 Other amnesia: Secondary | ICD-10-CM | POA: Diagnosis not present

## 2023-09-17 DIAGNOSIS — R296 Repeated falls: Secondary | ICD-10-CM | POA: Insufficient documentation

## 2023-09-17 DIAGNOSIS — J301 Allergic rhinitis due to pollen: Secondary | ICD-10-CM | POA: Diagnosis not present

## 2023-09-17 DIAGNOSIS — Z1211 Encounter for screening for malignant neoplasm of colon: Secondary | ICD-10-CM

## 2023-09-17 DIAGNOSIS — W108XXA Fall (on) (from) other stairs and steps, initial encounter: Secondary | ICD-10-CM

## 2023-09-17 MED ORDER — OXYCODONE HCL 5 MG PO TABS: 5.0000 mg | ORAL_TABLET | Freq: Four times a day (QID) | ORAL | 0 refills | Status: DC | PRN

## 2023-09-17 MED ORDER — NALOXONE HCL 4 MG/0.1ML NA LIQD: NASAL | 1 refills | Status: AC

## 2023-09-17 NOTE — Patient Instructions (Addendum)
[facility] [address] [place], [place] [address] Referral line:  [phone]  Phone: [phone]   [facility] - for sleep study [address], [place], [place] [address] Phone: [phone]  -Recommend Shingles vaccine, Tetanus vaccine, and COVID booster. You can get these at a pharmacy and have records faxed to the clinic.

## 2023-09-17 NOTE — Progress Notes (Signed)
Established Patient Visit   Patient: Maria Hodge   DOB: 1961/07/14   63 y.o. Female  MRN: [id number] Visit Date: 09/17/2023  Today's healthcare provider: Sherlyn Hay, DO   Chief Complaint  Patient presents with   Fall   Headache   Subjective    Maria Hodge is a 63 y.o. female who presents today for a complete physical exam.  She reports consuming a general diet. The patient does not participate in regular exercise at present. She generally feels fairly well. She reports sleeping fairly well. She does have additional problems to discuss today.  HPI  The patient, with a history of falls, presents with concerns about memory loss and persistent headaches, which she attributes to a fall that occurred approximately two months prior. The patient reports hitting her head and losing consciousness for about an hour during the fall. Since then, she has experienced worsening headaches, particularly on the left side, and facial pain due to a suspected broken jaw. The patient also reports tenderness in the jaw area, which has made it difficult to wear glasses.  In addition to the fall-related concerns, the patient has been experiencing congestion for the past week, which she believes is due to seasonal allergies. She denies any recent fever, chills, lightheadedness, or dizziness. The patient also reports chronic pain in her ankles, which has limited her ability to exercise and has contributed to her history of falls.  The patient has been struggling with depression and anxiety, for which she is under the care of a psychiatrist. She reports a positive relationship with her new psychiatrist, despite some communication difficulties. The patient also mentions a history of alcohol use but denies any recent use.  The patient's memory concerns are significant, with difficulty recalling dates, names, and recent instructions. She has difficulty with orientation to time, struggling to recall the  current year, season, and date.   The patient's overall health status is reported as "okay," but she expresses frustration with her current condition and the impact it has had on her daily life. She reports difficulty sleeping and a lack of exercise due to ankle pain. The patient has a history of smoking and is currently trying to quit, with current use down to four or five cigarettes a day.   Past Medical History:  Diagnosis Date   Anxiety    Arrhythmia    Arthritis    hands   Depression    GERD (gastroesophageal reflux disease)    Heart murmur    Hypertension    Wears dentures    full upper   Past Surgical History:  Procedure Laterality Date   BACK SURGERY  2010   ORIF ANKLE FRACTURE Left 11/09/2017   Procedure: OPEN REDUCTION INTERNAL FIXATION (ORIF) ANKLE FRACTURE WITH POSSIBLE SYNDESMOSIS REPAIR;  Surgeon: Signa Kell, MD;  Location: [facility];  Service: Orthopedics;  Laterality: Left;  GENERAL WITH NERVE BLOCK ALEX SYNDEAMOSIS TIGHTROPE MARK ANKLE FRACTURE PLATES  C ARM  PLASTER SPLINT   ORIF ANKLE FRACTURE Right 12/24/2021   Procedure: OPEN REDUCTION INTERNAL FIXATION (ORIF) ANKLE FRACTURE;  Surgeon: Gwyneth Revels, DPM;  Location: [facility];  Service: Podiatry;  Laterality: Right;  Anesthesia: General with pop liteal   STERIOD INJECTION  12/21/2011   Procedure: MINOR STEROID INJECTION;  Surgeon: Mat Carne, MD;  Location: [facility];  Service: Orthopedics;  Laterality: Right;  Right L3-4 transforaminal epidural steroid injection, attempted right L4-5 epidural steroid injection( not feasible  secondary to fusion mass)   Social History   Socioeconomic History   Marital status: Married    Spouse name: Not on file   Number of children: Not on file   Years of education: Not on file   Highest education level: Not on file  Occupational History   Not on file  Tobacco Use   Smoking status: Every Day    Current packs/day: 0.50     Average packs/day: 0.5 packs/day for 31.3 years (15.7 ttl pk-yrs)    Types: Cigarettes    Start date: 06/05/1992   Smokeless tobacco: Never   Tobacco comments:    down to 1/3 PPD  Vaping Use   Vaping status: Never Used  Substance and Sexual Activity   Alcohol use: No    Alcohol/week: 0.0 standard drinks of alcohol   Drug use: No   Sexual activity: Not Currently    Birth control/protection: None  Other Topics Concern   Not on file  Social History Narrative   Not on file   Social Drivers of Health   Financial Resource Strain: Not on file  Food Insecurity: No Food Insecurity (07/13/2023)   Hunger Vital Sign    Worried About Running Out of Food in the Last Year: Never true    Ran Out of Food in the Last Year: Never true  Transportation Needs: No Transportation Needs (07/13/2023)   PRAPARE - Administrator, Civil Service (Medical): No    Lack of Transportation (Non-Medical): No  Physical Activity: Not on file  Stress: Not on file  Social Connections: Not on file  Intimate Partner Violence: Not At Risk (07/13/2023)   Humiliation, Afraid, Rape, and Kick questionnaire    Fear of Current or Ex-Partner: No    Emotionally Abused: No    Physically Abused: No    Sexually Abused: No   Family Status  Relation Name Status   Mother  Deceased   Father  Deceased   Sister  Alive   Sister  Deceased   Sister  Deceased   Sister  Deceased   Brother  Deceased  No partnership data on file   Family History  Problem Relation Age of Onset   Heart failure Mother    Hypertension Mother    Depression Mother    Anxiety disorder Mother    Cancer Father    Rheum arthritis Father    Depression Sister    Heart failure Sister    Rheum arthritis Sister    Heart failure Sister    Cancer Sister    Allergies  Allergen Reactions   Penicillins Anaphylaxis and Swelling   Aspirin Other (See Comments)    Gastritis  Other Reaction(s): GI Upset (intolerance)   Codeine Hives         Hydrocodone Hives    Patient Care Team: Mansour Balboa N, DO as PCP - General (Family Medicine)   Medications: Outpatient Medications Prior to Visit  Medication Sig   acetaminophen (TYLENOL) 500 MG tablet Take 500 mg by mouth every 6 (six) hours as needed.   amitriptyline (ELAVIL) 150 MG tablet Take 1 tablet (150 mg total) by mouth at bedtime.   carbamide peroxide (DEBROX) 6.5 % OTIC solution Place 5 drops into both ears 2 (two) times daily. Start 3 days before your next appointment   clonazePAM (KLONOPIN) 0.5 MG tablet Take 1 tablet (0.5 mg total) by mouth 2 (two) times daily.   esomeprazole (NEXIUM) 20 MG capsule Take 20 mg by mouth daily.  losartan (COZAAR) 25 MG tablet TAKE 1 TABLET (25 MG TOTAL) BY MOUTH DAILY.   nicotine polacrilex (NICOTINE MINI) 2 MG lozenge Take 1 lozenge (2 mg total) by mouth as needed for smoking cessation.   QUEtiapine (SEROQUEL) 400 MG tablet Take 1 tablet (400 mg total) by mouth 2 (two) times daily.   No facility-administered medications prior to visit.    Review of Systems  Constitutional:  Negative for chills, fatigue and fever.  HENT:  Positive for congestion. Negative for ear pain, rhinorrhea, sneezing and sore throat.   Eyes: Negative.  Negative for pain, redness and visual disturbance.  Respiratory:  Negative for cough, shortness of breath and wheezing.   Cardiovascular:  Negative for chest pain, palpitations and leg swelling.  Gastrointestinal:  Negative for abdominal pain, blood in stool, constipation, diarrhea and nausea.  Endocrine: Negative for polydipsia and polyphagia.  Genitourinary: Negative.  Negative for dysuria, flank pain, hematuria, pelvic pain, vaginal bleeding and vaginal discharge.  Musculoskeletal:  Positive for arthralgias and back pain. Negative for gait problem and joint swelling.  Skin:  Negative for rash.  Neurological:  Positive for headaches (since her fall in 2023; unchanged from baseline). Negative for dizziness, tremors,  seizures, weakness, light-headedness and numbness.  Hematological:  Negative for adenopathy.  Psychiatric/Behavioral: Negative.  Negative for behavioral problems, confusion and dysphoric mood. The patient is not nervous/anxious and is not hyperactive.       Objective    BP 119/86   Pulse (!) 112   Resp 20   Ht 5\' 5"  (1.651 m)   Wt 140 lb (63.5 kg)   SpO2 92%   BMI 23.30 kg/m    Physical Exam Vitals and nursing note reviewed.  Constitutional:      General: She is awake.     Appearance: Normal appearance.  HENT:     Head: Normocephalic and atraumatic.     Jaw: Malocclusion present.     Comments: Mandible appears displaced to the right    Right Ear: Tympanic membrane, ear canal and external ear normal.     Left Ear: Tympanic membrane, ear canal and external ear normal.     Nose: Nose normal.     Mouth/Throat:     Mouth: Mucous membranes are moist.     Pharynx: Oropharynx is clear. No oropharyngeal exudate or posterior oropharyngeal erythema.  Eyes:     General: No scleral icterus.    Extraocular Movements: Extraocular movements intact.     Conjunctiva/sclera: Conjunctivae normal.     Pupils: Pupils are equal, round, and reactive to light.  Neck:     Thyroid: No thyromegaly or thyroid tenderness.  Cardiovascular:     Rate and Rhythm: Normal rate and regular rhythm.     Pulses: Normal pulses.     Heart sounds: Normal heart sounds.  Pulmonary:     Effort: Pulmonary effort is normal. No tachypnea, bradypnea or respiratory distress.     Breath sounds: Normal breath sounds. No stridor. No wheezing, rhonchi or rales.  Abdominal:     General: Bowel sounds are normal. There is no distension.     Palpations: Abdomen is soft. There is no mass.     Tenderness: There is no abdominal tenderness. There is no guarding.     Hernia: No hernia is present.  Musculoskeletal:     Cervical back: Normal range of motion and neck supple.     Right lower leg: No edema.     Left lower leg: No  edema.  Lymphadenopathy:     Cervical: No cervical adenopathy.  Skin:    General: Skin is warm and dry.  Neurological:     Mental Status: She is alert and oriented to person, place, and time. Mental status is at baseline.  Psychiatric:        Mood and Affect: Mood normal.        Behavior: Behavior normal.      Last depression screening scores    09/17/2023    2:13 PM 07/13/2023    1:16 PM 06/14/2023   10:14 AM  PHQ 2/9 Scores  PHQ - 2 Score 6 6   PHQ- 9 Score 24 23      Information is confidential and restricted. Go to Review Flowsheets to unlock data.   Last fall risk screening    09/17/2023    2:21 PM  Fall Risk   Falls in the past year? 1  Number falls in past yr: 1  Injury with Fall? 1   Last Audit-C alcohol use screening    09/17/2023    2:25 PM  Alcohol Use Disorder Test (AUDIT)  1. How often do you have a drink containing alcohol? 0  2. How many drinks containing alcohol do you have on a typical day when you are drinking? 0  3. How often do you have six or more drinks on one occasion? 0  AUDIT-C Score 0   A score of 3 or more in women, and 4 or more in men indicates increased risk for alcohol abuse, EXCEPT if all of the points are from question 1   No results found for any visits on 09/17/23.  Assessment & Plan    Routine Health Maintenance and Physical Exam  Exercise Activities and Dietary recommendations  Goals   None     Immunization History  Administered Date(s) Administered   Influenza, Seasonal, Injecte, Preservative Fre 07/13/2023   PNEUMOCOCCAL CONJUGATE-20 11/15/2021    Health Maintenance  Topic Date Due   Medicare Annual Wellness (AWV)  Never done   COVID-19 Vaccine (1) Never done   Hepatitis C Screening  Never done   DTaP/Tdap/Td (1 - Tdap) Never done   Zoster Vaccines- Shingrix (1 of 2) Never done   Cervical Cancer Screening (HPV/Pap Cotest)  Never done   Colonoscopy  Never done   MAMMOGRAM  Never done   Pneumococcal Vaccine  70-30 Years old  Completed   INFLUENZA VACCINE  Completed   HIV Screening  Completed   HPV VACCINES  Aged Out    Discussed health benefits of physical activity, and encouraged her to engage in regular exercise appropriate for her age and condition.   Fall on steps, initial encounter Assessment & Plan: Persistent jaw pain and asymmetry since recent fall, suspected jaw fracture. Discussed potential need for surgical intervention and benefits of seeing an oral facial surgeon. - Order head CT - Refer to oral facial surgeon  Orders: -     Ambulatory referral to Neurology -     CT HEAD WO CONTRAST ( ); Future -     Ambulatory referral to Oral Maxillofacial Surgery  Frequent falls Assessment & Plan: Addressed as noted above.  Orders: -     Ambulatory referral to Neurology -     Ambulatory referral to Oral Maxillofacial Surgery  Concussion with < 1 hr loss of consciousness Assessment & Plan: Headaches since recent fall, worse than previous episodes. Patient lost consciousness during the fall and has persistent left-sided facial and head pain. Discussed  risks of untreated head trauma and benefits of imaging. - Order head CT - Refer to neurology - Prescribe oxycodone with acetaminophen for pain management - Provide naloxone spray for emergency use due to concurrent clonazepam use  Orders: -     Ambulatory referral to Neurology -     CT HEAD WO CONTRAST ( ); Future  Memory loss, short term Assessment & Plan: Memory loss with difficulty recalling names, dates, and recent events, possibly related to a recent fall. No prior neurology follow-up. - Order head CT - Refer to neurology  Orders: -     Ambulatory referral to Neurology  Other chronic pain Assessment & Plan: Chronic pain in hips and ankles, possibly related to previous injuries and falls. Current pain management includes oxycodone prescription today. Discussed risks of opioid use and alternative pain management  options. - Continue current pain management - Consider alternative pain management options if needed  Orders: -     Ambulatory referral to Oral Maxillofacial Surgery -     oxyCODONE HCl; Take 1 tablet (5 mg total) by mouth every 6 (six) hours as needed for severe pain (pain score 7-10).  Dispense: 20 tablet; Refill: 0  High risk medication use -     Naloxone HCl; Initial, 1 spray (4 mg) intranasally into 1 nostril and call 9-1-1 if not already done. Use a new naloxone nasal spray for subsequent doses and administer into alternating nostrils. May repeat every 2 to 3 minutes.  Dispense: 2 each; Refill: 1  Encounter for colorectal cancer screening -     Ambulatory referral to Gastroenterology  Encounter for smoking cessation counseling Assessment & Plan: Currently smoking 4-5 cigarettes per day. Previous attempts to quit using patches and lozenges were unsuccessful due to insurance coverage issues. Discussed health benefits of smoking cessation and alternative aids. - Encourage smoking cessation - Discuss alternative smoking cessation aids   Allergic rhinitis due to pollen, unspecified seasonality Assessment & Plan: Congestion for the past week without fever, chills, or other systemic symptoms, likely related to seasonal allergies. Discussed symptomatic treatment options. - Symptomatic treatment as needed   General Health Maintenance Routine health maintenance including vaccinations and screenings. Discussed importance of preventative care. - Recommend vaccines at the pharmacy - Refer for colonoscopy for colon cancer screening  Follow-up - Contact orthopedics for appointment - Contact pulmonary for sleep study - Ensure completion of blood work. Return in about 4 weeks (around 10/15/2023) for mAWV w/AWV nurse; then with PCP in 6 weeks.     I discussed the assessment and treatment plan with the patient  The patient was provided an opportunity to ask questions and all were answered.  The patient agreed with the plan and demonstrated an understanding of the instructions.   The patient was advised to call back or seek an in-person evaluation if the symptoms worsen or if the condition fails to improve as anticipated.  Total time was 60 minutes. That includes chart review before the visit, the actual patient visit, and time spent on documentation after the visit.    Sherlyn Hay, DO  [facility] [phone] (phone) [phone] (fax)  [facility]

## 2023-09-26 ENCOUNTER — Encounter: Payer: Self-pay | Admitting: Family Medicine

## 2023-09-26 DIAGNOSIS — G8929 Other chronic pain: Secondary | ICD-10-CM | POA: Insufficient documentation

## 2023-09-26 DIAGNOSIS — W108XXA Fall (on) (from) other stairs and steps, initial encounter: Secondary | ICD-10-CM | POA: Insufficient documentation

## 2023-09-26 DIAGNOSIS — R413 Other amnesia: Secondary | ICD-10-CM | POA: Insufficient documentation

## 2023-09-26 DIAGNOSIS — S060X9A Concussion with loss of consciousness of unspecified duration, initial encounter: Secondary | ICD-10-CM | POA: Insufficient documentation

## 2023-09-26 DIAGNOSIS — J309 Allergic rhinitis, unspecified: Secondary | ICD-10-CM | POA: Insufficient documentation

## 2023-09-26 NOTE — Assessment & Plan Note (Signed)
Addressed as noted above.

## 2023-09-26 NOTE — Assessment & Plan Note (Deleted)
Persistent jaw pain and asymmetry since recent fall, suspected jaw fracture. Discussed potential need for surgical intervention and benefits of seeing an oral facial surgeon. - Order head CT - Refer to oral facial surgeon

## 2023-09-26 NOTE — Assessment & Plan Note (Addendum)
Chronic pain in hips and ankles, possibly related to previous injuries and falls. Current pain management includes oxycodone prescription today. Discussed risks of opioid use and alternative pain management options. - Continue current pain management - Consider alternative pain management options if needed

## 2023-09-26 NOTE — Assessment & Plan Note (Signed)
High PHQ-9 and GAD-7 screening scores. Currently under care of a new psychiatrist. Missed recent appointment due to scheduling issue. Discussed importance of consistent follow-up. - Encourage follow-up with psychiatrist - Discuss virtual appointments if in-person visits are not possible

## 2023-09-26 NOTE — Assessment & Plan Note (Signed)
Congestion for the past week without fever, chills, or other systemic symptoms, likely related to seasonal allergies. Discussed symptomatic treatment options. - Symptomatic treatment as needed

## 2023-09-26 NOTE — Assessment & Plan Note (Signed)
Persistent jaw pain and asymmetry since recent fall, suspected jaw fracture. Discussed potential need for surgical intervention and benefits of seeing an oral facial surgeon. - Order head CT - Refer to oral facial surgeon

## 2023-09-26 NOTE — Assessment & Plan Note (Signed)
Currently smoking 4-5 cigarettes per day. Previous attempts to quit using patches and lozenges were unsuccessful due to insurance coverage issues. Discussed health benefits of smoking cessation and alternative aids. - Encourage smoking cessation - Discuss alternative smoking cessation aids

## 2023-09-26 NOTE — Assessment & Plan Note (Signed)
Memory loss with difficulty recalling names, dates, and recent events, possibly related to a recent fall. No prior neurology follow-up. - Order head CT - Refer to neurology

## 2023-09-26 NOTE — Assessment & Plan Note (Signed)
Headaches since recent fall, worse than previous episodes. Patient lost consciousness during the fall and has persistent left-sided facial and head pain. Discussed risks of untreated head trauma and benefits of imaging. - Order head CT - Refer to neurology - Prescribe oxycodone with acetaminophen for pain management - Provide naloxone spray for emergency use due to concurrent clonazepam use

## 2023-09-27 ENCOUNTER — Ambulatory Visit: Admission: RE | Admit: 2023-09-27 | Payer: Medicare HMO | Source: Ambulatory Visit

## 2023-09-29 ENCOUNTER — Telehealth: Payer: Self-pay

## 2023-09-29 ENCOUNTER — Other Ambulatory Visit: Payer: Self-pay | Admitting: Psychiatry

## 2023-09-29 ENCOUNTER — Other Ambulatory Visit: Payer: Self-pay | Admitting: Family Medicine

## 2023-09-29 DIAGNOSIS — G8929 Other chronic pain: Secondary | ICD-10-CM

## 2023-09-29 MED ORDER — CLONAZEPAM 0.5 MG PO TABS: 0.5000 mg | ORAL_TABLET | Freq: Two times a day (BID) | ORAL | 0 refills | Status: DC

## 2023-09-29 NOTE — Telephone Encounter (Signed)
Ordered

## 2023-09-29 NOTE — Telephone Encounter (Signed)
pt states she needs a refill on the clonazepam. pt was last seen on 10-14 next appt 2-24

## 2023-09-29 NOTE — Telephone Encounter (Signed)
Copied from CRM [id number]. Topic: General - Other >> Sep 29, 2023 10:22 AM Everette C wrote: Reason for CRM: Medication Refill - Most Recent Primary Care Visit:  Provider: Sherlyn Hay Department: [facility] Visit Type: PHYSICAL Date: 09/17/2023  Medication: oxyCODONE (OXY IR/ROXICODONE) 5 MG immediate release tablet [[id number]]  Has the patient contacted their pharmacy? Yes (Agent: If no, request that the patient contact the pharmacy for the refill. If patient does not wish to contact the pharmacy document the reason why and proceed with request.) (Agent: If yes, when and what did the pharmacy advise?)  Is this the correct pharmacy for this prescription? Yes If no, delete pharmacy and type the correct one.  This is the patient's preferred pharmacy:  [id number] - [place], [place] - [address] [place] [address] Phone: [phone] Fax: [phone]   Has the prescription been filled recently? Yes  Is the patient out of the medication? No  Has the patient been seen for an appointment in the last year OR does the patient have an upcoming appointment? Yes  Can we respond through MyChart? No  Agent: Please be advised that Rx refills may take up to 3 business days. We ask that you follow-up with your pharmacy.

## 2023-09-29 NOTE — Telephone Encounter (Signed)
Left message that rx was sent to the pharmacy

## 2023-09-30 ENCOUNTER — Other Ambulatory Visit: Payer: Self-pay | Admitting: Family Medicine

## 2023-09-30 DIAGNOSIS — G8929 Other chronic pain: Secondary | ICD-10-CM

## 2023-09-30 NOTE — Telephone Encounter (Signed)
Requested medication (s) are due for refill today - unsure  Requested medication (s) are on the active medication list -yes  Future visit scheduled -yes  Last refill: 09/17/23 #20  Notes to clinic: non delegated Rx  Requested Prescriptions  Pending Prescriptions Disp Refills   oxyCODONE (OXY IR/ROXICODONE) 5 MG immediate release tablet 20 tablet 0    Sig: Take 1 tablet (5 mg total) by mouth every 6 (six) hours as needed for severe pain (pain score 7-10).     Not Delegated - Analgesics:  Opioid Agonists Failed - 09/30/2023  8:13 AM      Failed - This refill cannot be delegated      Failed - Urine Drug Screen completed in last 360 days      Passed - Valid encounter within last 3 months    Recent Outpatient Visits           1 week ago Fall on steps, initial encounter   [facility] P[facility]ue, Monico Blitz, DO   2 months ago Encounter for medical examination to establish care   [facility] P[facility]ue, Monico Blitz, DO       Future Appointments             In 3 weeks Pardue, Monico Blitz, DO [place] [facility], [facility]               Requested Prescriptions  Pending Prescriptions Disp Refills   oxyCODONE (OXY IR/ROXICODONE) 5 MG immediate release tablet 20 tablet 0    Sig: Take 1 tablet (5 mg total) by mouth every 6 (six) hours as needed for severe pain (pain score 7-10).     Not Delegated - Analgesics:  Opioid Agonists Failed - 09/30/2023  8:13 AM      Failed - This refill cannot be delegated      Failed - Urine Drug Screen completed in last 360 days      Passed - Valid encounter within last 3 months    Recent Outpatient Visits           1 week ago Fall on steps, initial encounter   [facility] Pardue, Monico Blitz, DO   2 months ago Encounter for medical examination to establish care   [facility] Pardue, Monico Blitz, DO       Future Appointments             In 3  weeks Pardue, Monico Blitz, DO [place] [facility], [facility]

## 2023-09-30 NOTE — Telephone Encounter (Signed)
Medication Refill -  Most Recent Primary Care Visit:  Provider: Sherlyn Hay  Department: [facility]  Visit Type: PHYSICAL  Date: 09/17/2023  Medication: oxyCODONE (OXY IR/ROXICODONE) 5 MG immediate release tablet [[id number]]   Has the patient contacted their pharmacy? Yes (Agent: If no, request that the patient contact the pharmacy for the refill. If patient does not wish to contact the pharmacy document the reason why and proceed with request.) (Agent: If yes, when and what did the pharmacy advise?)  Is this the correct pharmacy for this prescription? Yes If no, delete pharmacy and type the correct one.  This is the patient's preferred pharmacy:  [id number] - [place],  - [address] [place] [address] Phone: [phone] Fax: [phone]   Has the prescription been filled recently? Yes  Is the patient out of the medication? Yes  Has the patient been seen for an appointment in the last year OR does the patient have an upcoming appointment? Yes  Can we respond through MyChart? Yes  Agent: Please be advised that Rx refills may take up to 3 business days. We ask that you follow-up with your pharmacy.

## 2023-10-01 NOTE — Telephone Encounter (Signed)
Requested medications are due for refill today.  yes  Requested medications are on the active medications list.  yes  Last refill. 09/17/2023 #20 0 rf  Future visit scheduled.   yes  Notes to clinic.  Refill not delegated.    Requested Prescriptions  Pending Prescriptions Disp Refills   oxyCODONE (OXY IR/ROXICODONE) 5 MG immediate release tablet 20 tablet 0    Sig: Take 1 tablet (5 mg total) by mouth every 6 (six) hours as needed for severe pain (pain score 7-10).     Not Delegated - Analgesics:  Opioid Agonists Failed - 10/01/2023  1:35 PM      Failed - This refill cannot be delegated      Failed - Urine Drug Screen completed in last 360 days      Passed - Valid encounter within last 3 months    Recent Outpatient Visits           2 weeks ago Fall on steps, initial encounter   [facility], Monico Blitz, DO   2 months ago Encounter for medical examination to establish care   [facility], Monico Blitz, DO       Future Appointments             In 3 weeks [facility], Monico Blitz, DO [facility] [facility]

## 2023-10-02 ENCOUNTER — Other Ambulatory Visit: Payer: Self-pay | Admitting: Psychiatry

## 2023-10-04 ENCOUNTER — Encounter: Payer: Self-pay | Admitting: *Deleted

## 2023-10-05 ENCOUNTER — Ambulatory Visit
Admission: RE | Admit: 2023-10-05 | Discharge: 2023-10-05 | Disposition: A | Discharge status: Home / Self Care | Payer: Medicare HMO | Source: Ambulatory Visit | Attending: Family Medicine | Admitting: Family Medicine

## 2023-10-05 DIAGNOSIS — S060X9A Concussion with loss of consciousness of unspecified duration, initial encounter: Secondary | ICD-10-CM | POA: Diagnosis not present

## 2023-10-05 DIAGNOSIS — W108XXA Fall (on) (from) other stairs and steps, initial encounter: Secondary | ICD-10-CM | POA: Diagnosis not present

## 2023-10-05 DIAGNOSIS — R519 Headache, unspecified: Secondary | ICD-10-CM | POA: Diagnosis not present

## 2023-10-05 MED ORDER — OXYCODONE HCL 5 MG PO TABS: 5.0000 mg | ORAL_TABLET | Freq: Four times a day (QID) | ORAL | 0 refills | Status: AC | PRN
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
## 2023-10-25 ENCOUNTER — Ambulatory Visit (INDEPENDENT_AMBULATORY_CARE_PROVIDER_SITE_OTHER): Payer: Medicare HMO | Admitting: Psychiatry

## 2023-10-25 ENCOUNTER — Encounter: Payer: Self-pay | Admitting: Psychiatry

## 2023-10-25 VITALS — BP 130/84 | HR 100 | Temp 98.1°F | Ht 65.0 in | Wt 138.8 lb

## 2023-10-25 DIAGNOSIS — F0630 Mood disorder due to known physiological condition, unspecified: Secondary | ICD-10-CM

## 2023-10-25 DIAGNOSIS — F4310 Post-traumatic stress disorder, unspecified: Secondary | ICD-10-CM | POA: Diagnosis not present

## 2023-10-25 DIAGNOSIS — F419 Anxiety disorder, unspecified: Secondary | ICD-10-CM | POA: Diagnosis not present

## 2023-10-25 NOTE — Patient Instructions (Addendum)
 Decrease amitriptyline 100 mg at night  Continue quetiapine 400 mg twice a day Continue clonazepam 0.5 mg twice a day as needed for anxiety  Referral for sleep evaluation Referral for therapy  Obtain lab at [facility] Next appointment: 4/17 at 10:30

## 2023-10-26 ENCOUNTER — Telehealth: Payer: Self-pay

## 2023-10-26 ENCOUNTER — Other Ambulatory Visit: Payer: Self-pay | Admitting: Psychiatry

## 2023-10-26 MED ORDER — CLONAZEPAM 0.5 MG PO TABS: 0.5000 mg | ORAL_TABLET | Freq: Two times a day (BID) | ORAL | 1 refills | Status: DC

## 2023-10-26 NOTE — Telephone Encounter (Signed)
 Ordered

## 2023-10-26 NOTE — Telephone Encounter (Signed)
 pt needs refills on the clonazepam. pt was last seen on 2-24 next appt 4-17

## 2023-10-27 ENCOUNTER — Ambulatory Visit: Payer: Medicare HMO

## 2023-10-27 ENCOUNTER — Ambulatory Visit: Payer: Self-pay | Admitting: Family Medicine

## 2023-10-27 NOTE — Telephone Encounter (Signed)
 no answer mailbox full. was going to notifiy that rx was sent but it was not answer and no message could be left mailbox full.

## 2023-10-29 ENCOUNTER — Telehealth: Payer: Self-pay

## 2023-10-29 NOTE — Telephone Encounter (Signed)
 She should have another refill. Please contact the pharmacy to verify this.

## 2023-10-29 NOTE — Telephone Encounter (Signed)
 Patient called requesting a refill for the following medication please advise  QUEtiapine (SEROQUEL) 400 MG tablet   Last visit 10-25-23 Next visit 12-16-23   Preferred pharmacy [phone] [facility], [place] - [address] Phone: [phone]  Fax: [phone]

## 2023-11-01 ENCOUNTER — Telehealth (HOSPITAL_COMMUNITY): Payer: Self-pay

## 2023-11-01 ENCOUNTER — Telehealth: Payer: Self-pay

## 2023-11-01 DIAGNOSIS — F3162 Bipolar disorder, current episode mixed, moderate: Secondary | ICD-10-CM

## 2023-11-01 MED ORDER — QUETIAPINE FUMARATE 400 MG PO TABS: 400.0000 mg | ORAL_TABLET | Freq: Two times a day (BID) | ORAL | 0 refills | Status: DC

## 2023-11-01 NOTE — Telephone Encounter (Signed)
 looks like the other CMA val has called pt and notified that rx was sent to the pharmacy

## 2023-11-01 NOTE — Addendum Note (Signed)
 Addended byJomarie Longs on: 11/01/2023 03:23 PM   Modules accepted: Orders

## 2023-11-01 NOTE — Telephone Encounter (Signed)
 no answer and mailbox was full could not leave a message.

## 2023-11-01 NOTE — Telephone Encounter (Signed)
 Done

## 2023-11-01 NOTE — Telephone Encounter (Signed)
 I have sent Seroquel to pharmacy at [facility] as requested.

## 2023-11-01 NOTE — Telephone Encounter (Signed)
 pt left a message that she needs a refill on the seroquel. pt was last seen on 2-24 next appt 4-17.  Pt is out

## 2023-11-01 NOTE — Telephone Encounter (Signed)
 Called patient to make aware that her Seroquel 400 mg tablets had been called in to her pharmacy she voiced understanding

## 2023-11-01 NOTE — Telephone Encounter (Signed)
 Patient called on Friday requesting a refill for the following medication the doctor listed as the prescriber is Dr Robb Matar who is a  hospitalist, I sent the request to Dr Vanetta Shawl she stated that the patient has a refill on fill, I called the pharmacy spoke to Vernona Rieger she stated that there is not a refill on file and the Rx has expired patient is out of the medication and stated that she really needs the refill please advise  QUEtiapine (SEROQUEL) 400 MG tablet   Last visit 10-25-23 Next visit 12-16-23  Preferred pharmacy [id number] [place], [place] - [address] Phone: [phone]  Fax: [phone]

## 2023-11-03 ENCOUNTER — Ambulatory Visit: Payer: Medicare HMO | Admitting: Emergency Medicine

## 2023-11-03 VITALS — Ht 65.0 in | Wt 138.0 lb

## 2023-11-03 DIAGNOSIS — Z599 Problem related to housing and economic circumstances, unspecified: Secondary | ICD-10-CM | POA: Diagnosis not present

## 2023-11-03 DIAGNOSIS — Z0000 Encounter for general adult medical examination without abnormal findings: Secondary | ICD-10-CM | POA: Diagnosis not present

## 2023-11-03 DIAGNOSIS — Z5941 Food insecurity: Secondary | ICD-10-CM | POA: Diagnosis not present

## 2023-11-03 DIAGNOSIS — Z1211 Encounter for screening for malignant neoplasm of colon: Secondary | ICD-10-CM

## 2023-11-03 NOTE — Progress Notes (Signed)
 Subjective:   Maria Hodge is a 63 y.o. who presents for a Medicare Wellness preventive visit.  Visit Complete: Virtual I connected with  BATUL DIEGO on 11/03/23 by a audio enabled telemedicine application and verified that I am speaking with the correct person using two identifiers.  Patient Location: Home  Provider Location: Home Office  I discussed the limitations of evaluation and management by telemedicine. The patient expressed understanding and agreed to proceed.  Vital Signs: Because this visit was a virtual/telehealth visit, some criteria may be missing or patient reported. Any vitals not documented were not able to be obtained and vitals that have been documented are patient reported.  VideoDeclined- This patient declined Librarian, academic. Therefore the visit was completed with audio only.  AWV Questionnaire: No: Patient Medicare AWV questionnaire was not completed prior to this visit.  Cardiac Risk Factors include: hypertension;smoking/ tobacco exposure     Objective:    Today's Vitals   11/03/23 1608  Weight: 138 lb (62.6 kg)  Height: 5\' 5"  (1.651 m)  PainSc: 7    Body mass index is 22.96 kg/m.     11/03/2023    4:27 PM 10/15/2022    6:46 PM 08/14/2022    9:18 PM 12/24/2021   12:32 PM 11/14/2021    6:16 AM 11/13/2021    8:30 PM 12/30/2020    7:52 PM  Advanced Directives  Does Patient Have a Medical Advance Directive? Yes No No Yes No No No  Type of Estate agent of [place];Living will   Healthcare Power of [place];Living will Living will    Does patient want to make changes to medical advance directive? No - Patient declined   No - Patient declined     Copy of Healthcare Power of Attorney in Chart? Yes - validated most recent copy scanned in chart (See row information)   Yes - validated most recent copy scanned in chart (See row information)     Would patient like information on creating a medical advance  directive?  No - Patient declined   No - Patient declined  No - Patient declined    Current Medications (verified) Outpatient Encounter Medications as of 11/03/2023  Medication Sig   acetaminophen (TYLENOL) 500 MG tablet Take 500 mg by mouth every 6 (six) hours as needed.   amitriptyline (ELAVIL) 150 MG tablet Take 1 tablet (150 mg total) by mouth at bedtime.   clonazePAM (KLONOPIN) 0.5 MG tablet Take 1 tablet (0.5 mg total) by mouth 2 (two) times daily.   esomeprazole (NEXIUM) 20 MG capsule Take 20 mg by mouth daily.   losartan (COZAAR) 25 MG tablet TAKE 1 TABLET (25 MG TOTAL) BY MOUTH DAILY.   naloxone (NARCAN) nasal spray 4 mg/0.1 mL Initial, 1 spray (4 mg) intranasally into 1 nostril and call 9-1-1 if not already done. Use a new naloxone nasal spray for subsequent doses and administer into alternating nostrils. May repeat every 2 to 3 minutes.   QUEtiapine (SEROQUEL) 400 MG tablet Take 1 tablet (400 mg total) by mouth 2 (two) times daily.   carbamide peroxide (DEBROX) 6.5 % OTIC solution Place 5 drops into both ears 2 (two) times daily. Start 3 days before your next appointment (Patient not taking: Reported on 11/03/2023)   nicotine polacrilex (NICOTINE MINI) 2 MG lozenge Take 1 lozenge (2 mg total) by mouth as needed for smoking cessation. (Patient not taking: Reported on 11/03/2023)   oxyCODONE (OXY IR/ROXICODONE) 5 MG immediate  release tablet Take 1 tablet (5 mg total) by mouth every 6 (six) hours as needed for severe pain (pain score 7-10). (Patient not taking: Reported on 11/03/2023)   No facility-administered encounter medications on file as of 11/03/2023.    Allergies (verified) Penicillin g, Penicillins, Aspirin, Codeine, and Hydrocodone   History: Past Medical History:  Diagnosis Date   Anxiety    Arrhythmia    Arthritis    hands   Depression    GERD (gastroesophageal reflux disease)    Heart murmur    Hypertension    Wears dentures    full upper   Past Surgical History:   Procedure Laterality Date   BACK SURGERY  2010   ORIF ANKLE FRACTURE Left 11/09/2017   Procedure: OPEN REDUCTION INTERNAL FIXATION (ORIF) ANKLE FRACTURE WITH POSSIBLE SYNDESMOSIS REPAIR;  Surgeon: Signa Kell, MD;  Location: [facility];  Service: Orthopedics;  Laterality: Left;  GENERAL WITH NERVE BLOCK ALEX SYNDEAMOSIS TIGHTROPE MARK ANKLE FRACTURE PLATES  C ARM  PLASTER SPLINT   ORIF ANKLE FRACTURE Right 12/24/2021   Procedure: OPEN REDUCTION INTERNAL FIXATION (ORIF) ANKLE FRACTURE;  Surgeon: Gwyneth Revels, DPM;  Location: [facility];  Service: Podiatry;  Laterality: Right;  Anesthesia: General with pop liteal   STERIOD INJECTION  12/21/2011   Procedure: MINOR STEROID INJECTION;  Surgeon: Mat Carne, MD;  Location: [facility];  Service: Orthopedics;  Laterality: Right;  Right L3-4 transforaminal epidural steroid injection, attempted right L4-5 epidural steroid injection( 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)   Family History  Problem Relation Age of Onset   Heart failure Mother    Hypertension Mother    Depression Mother    Anxiety disorder Mother    Cancer Father    Rheum arthritis Father    Depression Sister    Heart failure Sister    Rheum arthritis Sister    Heart failure Sister    Cancer Sister    Social History   Socioeconomic History   Marital status: Married    Spouse name: Maurine Minister   Number of children: 2   Years of education: Not on file   Highest education level: Not on file  Occupational History   Not on file  Tobacco Use   Smoking status: Every Day    Current packs/day: 0.50    Average packs/day: 0.5 packs/day for 31.4 years (15.7 ttl pk-yrs)    Types: Cigarettes    Start date: 06/05/1992   Smokeless tobacco: Never   Tobacco comments:    down to 1/3 PPD  Vaping Use   Vaping status: Never Used  Substance and Sexual Activity   Alcohol use: No    Alcohol/week: 0.0 standard drinks of alcohol   Drug use: No    Sexual activity: Not Currently    Birth control/protection: None  Other Topics Concern   Not on file  Social History Narrative   Not on file   Social Drivers of Health   Financial Resource Strain: Medium Risk (11/03/2023)   Overall Financial Resource Strain (CARDIA)    Difficulty of Paying Living Expenses: Somewhat hard  Food Insecurity: Food Insecurity Present (11/03/2023)   Hunger Vital Sign    Worried About Running Out of Food in the Last Year: Sometimes true    Ran Out of Food in the Last Year: Sometimes true  Transportation Needs: No Transportation Needs (11/03/2023)   PRAPARE - Administrator, Civil Service (Medical): No    Lack of Transportation (Non-Medical):  No  Physical Activity: Inactive (11/03/2023)   Exercise Vital Sign    Days of Exercise per Week: 0 days    Minutes of Exercise per Session: 0 min  Stress: Stress Concern Present (11/03/2023)   [employer] of Occupational Health - Occupational Stress Questionnaire    Feeling of Stress : Very much  Social Connections: Socially Isolated (11/03/2023)   Social Connection and Isolation Panel [NHANES]    Frequency of Communication with Friends and Family: Never    Frequency of Social Gatherings with Friends and Family: Never    Attends Religious Services: Never    Database administrator or Organizations: No    Attends Engineer, structural: Never    Marital Status: Married    Tobacco Counseling Ready to quit: Not Answered Counseling given: Not Answered Tobacco comments: down to 1/3 PPD    Clinical Intake:  Pre-visit preparation completed: Yes  Pain : 0-10 Pain Score: 7  Pain Type: Chronic pain Pain Location: Hip Pain Orientation: Left Pain Descriptors / Indicators: Aching     BMI - recorded: 22.96 Nutritional Status: BMI of 19-24  Normal Nutritional Risks: None Diabetes: No  How often do you need to have someone help you when you read instructions, pamphlets, or other written materials  from your doctor or pharmacy?: 1 - Never  Interpreter Needed?: No  Information entered by :: Tora Kindred, CMA   Activities of Daily Living     11/03/2023    4:10 PM  In your present state of health, do you have any difficulty performing the following activities:  Hearing? 1  Comment can't afford hearing aids  Vision? 0  Difficulty concentrating or making decisions? 1  Comment remembering  Walking or climbing stairs? 1  Dressing or bathing? 0  Doing errands, shopping? 1  Comment doesn't drive, husband provides Wellsite geologist and eating ? N  Using the Toilet? N  In the past six months, have you accidently leaked urine? N  Do you have problems with loss of bowel control? N  Managing your Medications? N  Managing your Finances? N  Housekeeping or managing your Housekeeping? N    Patient Care Team: Sherlyn Hay, DO as PCP - General (Family Medicine)  Indicate any recent Medical Services you may have received from other than [facility] providers in the past year (date may be approximate).     Assessment:   This is a routine wellness examination for Jearline.  Hearing/Vision screen Hearing Screening - Comments:: Needs hearing aids, but can't afford Vision Screening - Comments:: Gets eye exams, [facility] [place] [facility]   Goals Addressed               This Visit's Progress     Quit Smoking (pt-stated)         Depression Screen     11/03/2023    4:20 PM 09/17/2023    2:13 PM 07/13/2023    1:16 PM 06/14/2023   10:14 AM  PHQ 2/9 Scores  PHQ - 2 Score 5 6 6    PHQ- 9 Score 14 24 23       Information is confidential and restricted. Go to Review Flowsheets to unlock data.    Fall Risk     11/03/2023    4:29 PM 09/17/2023    2:21 PM 07/13/2023    1:15 PM  Fall Risk   Falls in the past year? 1 1 1   Number falls in past yr: 1 1 1   Comment  3  Injury with Fall? 1 1 1   Risk for fall due to : History of fall(s);Impaired balance/gait;Orthopedic  patient;Impaired mobility  Impaired balance/gait  Follow up Falls prevention discussed;Falls evaluation completed;Education provided      MEDICARE RISK AT HOME:  Medicare Risk at Home Any stairs in or around the home?: Yes If so, are there any without handrails?: No Home free of loose throw rugs in walkways, pet beds, electrical cords, etc?: Yes Adequate lighting in your home to reduce risk of falls?: Yes Life alert?: No Use of a cane, walker or w/c?: No Grab bars in the bathroom?: Yes Shower chair or bench in shower?: Yes Elevated toilet seat or a handicapped toilet?: No  TIMED UP AND GO:  Was the test performed?  No  Cognitive Function: 6CIT completed    09/17/2023    2:35 PM  MMSE - Mini Mental State Exam  Not completed: --  Orientation to time 4  Orientation to Place 5  Registration 3  Attention/ Calculation 5  Recall 0  Language- name 2 objects 2  Language- repeat 1  Language- follow 3 step command 3  Language- read & follow direction 1  Write a sentence 1  Copy design 1  Total score 26        11/03/2023    4:31 PM  6CIT Screen  What Year? 0 points  What month? 0 points  What time? 0 points  Count back from 20 0 points  Months in reverse 4 points  Repeat phrase 10 points  Total Score 14 points    Immunizations Immunization History  Administered Date(s) Administered   Influenza Inj Mdck Quad Pf 07/03/2017   Influenza, Seasonal, Injecte, Preservative Fre 07/13/2023   Influenza,inj,Quad PF,6+ Mos 05/26/2018, 05/10/2019, 08/02/2020   PNEUMOCOCCAL CONJUGATE-20 11/15/2021    Screening Tests Health Maintenance  Topic Date Due   COVID-19 Vaccine (1) Never done   Hepatitis C Screening  Never done   DTaP/Tdap/Td (1 - Tdap) Never done   Zoster Vaccines- Shingrix (1 of 2) Never done   Colonoscopy  Never done   MAMMOGRAM  Never done   Cervical Cancer Screening (HPV/Pap Cotest)  07/09/2013   Medicare Annual Wellness (AWV)  11/02/2024   Pneumococcal Vaccine  41-21 Years old  Completed   INFLUENZA VACCINE  Completed   HIV Screening  Completed   HPV VACCINES  Aged Out    Health Maintenance  Health Maintenance Due  Topic Date Due   COVID-19 Vaccine (1) Never done   Hepatitis C Screening  Never done   DTaP/Tdap/Td (1 - Tdap) Never done   Zoster Vaccines- Shingrix (1 of 2) Never done   Colonoscopy  Never done   MAMMOGRAM  Never done   Cervical Cancer Screening (HPV/Pap Cotest)  07/09/2013   Health Maintenance Items Addressed: Cologuard Ordered, See Nurse Notes  Additional Screening:  Vision Screening: Recommended annual ophthalmology exams for early detection of glaucoma and other disorders of the eye.  Dental Screening: Recommended annual dental exams for proper oral hygiene  Community Resource Referral / Chronic Care Management: CRR required this visit?  No   CCM required this visit?  No     Plan:     I have personally reviewed and noted the following in the patient's chart:   Medical and social history Use of alcohol, tobacco or illicit drugs  Current medications and supplements including opioid prescriptions. Patient is not currently taking opioid prescriptions. Functional ability and status Nutritional status Physical activity Advanced  directives List of other physicians Hospitalizations, surgeries, and ER visits in previous 12 months Vitals Screenings to include cognitive, depression, and falls Referrals and appointments  In addition, I have reviewed and discussed with patient certain preventive protocols, quality metrics, and best practice recommendations. A written personalized care plan for preventive services as well as general preventive health recommendations were provided to patient.     Tora Kindred, CMA   11/03/2023   After Visit Summary: (Mail) Due to this being a telephonic visit, the after visit summary with patients personalized plan was offered to patient via mail   Notes:  6 CIT Score -  14 Needs Tdap vaccine Patient states she has had covid and Shingrix vaccines at [facility]. Declined MMG. Declined colonoscopy, but did agree to cologuard (order placed). Placed [facility]I referral for financial insecurity and food insecurity.

## 2023-11-03 NOTE — Patient Instructions (Addendum)
 Ms. Sliva , Thank you for taking time to come for your Medicare Wellness Visit. I appreciate your ongoing commitment to your health goals. Please review the following plan we discussed and let me know if I can assist you in the future.   Referrals/Orders/Follow-Ups/Clinician Recommendations: Get a tetanus shot at your convenience. I have placed an order for a cologuard to screen for colon cancer. You will receive the kit in the mail, complete kit and mail back.  I have placed a community resources referral. Expect someone to call you within the next 30 days to see if they can help with your financial and food instability.  This is a list of the screening recommended for you and due dates:  Health Maintenance  Topic Date Due   COVID-19 Vaccine (1) Never done   Hepatitis C Screening  Never done   DTaP/Tdap/Td vaccine (1 - Tdap) Never done   Zoster (Shingles) Vaccine (1 of 2) Never done   Colon Cancer Screening  Never done   Mammogram  Never done   Pap with HPV screening  07/09/2013   Medicare Annual Wellness Visit  11/02/2024   Pneumococcal Vaccination  Completed   Flu Shot  Completed   HIV Screening  Completed   HPV Vaccine  Aged Out    Advanced directives: (In Chart) A copy of your advanced directives are scanned into your chart should your provider ever need it.  Next Medicare Annual Wellness Visit scheduled for next year: Yes, 11/08/24 @ 3:50pm (phone visit)  Fall Prevention in the Home, Adult Falls can cause injuries and affect people of all ages. There are many simple things that you can do to make your home safe and to help prevent falls. If you need it, ask for help making these changes. What actions can I take to prevent falls? General information Use good lighting in all rooms. Make sure to: Replace any light bulbs that burn out. Turn on lights if it is dark and use night-lights. Keep items that you use often in easy-to-reach places. Lower the shelves around your home if  needed. Move furniture so that there are clear paths around it. Do not keep throw rugs or other things on the floor that can make you trip. If any of your floors are uneven, fix them. Add color or contrast paint or tape to clearly mark and help you see: Grab bars or handrails. First and last steps of staircases. Where the edge of each step is. If you use a ladder or stepladder: Make sure that it is fully opened. Do not climb a closed ladder. Make sure the sides of the ladder are locked in place. Have someone hold the ladder while you use it. Know where your pets are as you move through your home. What can I do in the bathroom?     Keep the floor dry. Clean up any water that is on the floor right away. Remove soap buildup in the bathtub or shower. Buildup makes bathtubs and showers slippery. Use non-skid mats or decals on the floor of the bathtub or shower. Attach bath mats securely with double-sided, non-slip rug tape. If you need to sit down while you are in the shower, use a non-slip stool. Install grab bars by the toilet and in the bathtub and shower. Do not use towel bars as grab bars. What can I do in the bedroom? Make sure that you have a light by your bed that is easy to reach. Do not use  any sheets or blankets on your bed that hang to the floor. Have a firm bench or chair with side arms that you can use for support when you get dressed. What can I do in the kitchen? Clean up any spills right away. If you need to reach something above you, use a sturdy step stool that has a grab bar. Keep electrical cables out of the way. Do not use floor polish or wax that makes floors slippery. What can I do with my stairs? Do not leave anything on the stairs. Make sure that you have a light switch at the top and the bottom of the stairs. Have them installed if you do not have them. Make sure that there are handrails on both sides of the stairs. Fix handrails that are broken or loose. Make  sure that handrails are as long as the staircases. Install non-slip stair treads on all stairs in your home if they do not have carpet. Avoid having throw rugs at the top or bottom of stairs, or secure the rugs with carpet tape to prevent them from moving. Choose a carpet design that does not hide the edge of steps on the stairs. Make sure that carpet is firmly attached to the stairs. Fix any carpet that is loose or worn. What can I do on the outside of my home? Use bright outdoor lighting. Repair the edges of walkways and driveways and fix any cracks. Clear paths of anything that can make you trip, such as tools or rocks. Add color or contrast paint or tape to clearly mark and help you see high doorway thresholds. Trim any bushes or trees on the main path into your home. Check that handrails are securely fastened and in good repair. Both sides of all steps should have handrails. Install guardrails along the edges of any raised decks or porches. Have leaves, snow, and ice cleared regularly. Use sand, salt, or ice melt on walkways during winter months if you live where there is ice and snow. In the garage, clean up any spills right away, including grease or oil spills. What other actions can I take? Review your medicines with your health care provider. Some medicines can make you confused or feel dizzy. This can increase your chance of falling. Wear closed-toe shoes that fit well and support your feet. Wear shoes that have rubber soles and low heels. Use a cane, walker, scooter, or crutches that help you move around if needed. Talk with your provider about other ways that you can decrease your risk of falls. This may include seeing a physical therapist to learn to do exercises to improve movement and strength. Where to find more information Centers for Disease Control and Prevention, STEADI: TonerPromos.no [employer] on Aging: BaseRingTones.pl National Institute on Aging: BaseRingTones.pl Contact a  health care provider if: You are afraid of falling at home. You feel weak, drowsy, or dizzy at home. You fall at home. Get help right away if you: Lose consciousness or have trouble moving after a fall. Have a fall that causes a head injury. These symptoms may be an emergency. Get help right away. Call 911. Do not wait to see if the symptoms will go away. Do not drive yourself to the hospital. This information is not intended to replace advice given to you by your health care provider. Make sure you discuss any questions you have with your health care provider. Document Revised: 04/20/2022 Document Reviewed: 04/20/2022 Elsevier Patient Education  2024 [employer].  Managing Pain Without Opioids Opioids are strong medicines used to treat moderate to severe pain. For some people, especially those who have long-term (chronic) pain, opioids may not be the best choice for pain management due to: Side effects like nausea, constipation, and sleepiness. The risk of addiction (opioid use disorder). The longer you take opioids, the greater your risk of addiction. Pain that lasts for more than 3 months is called chronic pain. Managing chronic pain usually requires more than one approach and is often provided by a team of health care providers working together (multidisciplinary approach). Pain management may be done at a pain management center or pain clinic. How to manage pain without the use of opioids Use non-opioid medicines Non-opioid medicines for pain may include: Over-the-counter or prescription non-steroidal anti-inflammatory drugs (NSAIDs). These may be the first medicines used for pain. They work well for muscle and bone pain, and they reduce swelling. Acetaminophen. This over-the-counter medicine may work well for milder pain but not swelling. Antidepressants. These may be used to treat chronic pain. A certain type of antidepressant (tricyclics) is often used. These medicines are given in  lower doses for pain than when used for depression. Anticonvulsants. These are usually used to treat seizures but may also reduce nerve (neuropathic) pain. Muscle relaxants. These relieve pain caused by sudden muscle tightening (spasms). You may also use a pain medicine that is applied to the skin as a patch, cream, or gel (topical analgesic), such as a numbing medicine. These may cause fewer side effects than medicines taken by mouth. Do certain therapies as directed Some therapies can help with pain management. They include: Physical therapy. You will do exercises to gain strength and flexibility. A physical therapist may teach you exercises to move and stretch parts of your body that are weak, stiff, or painful. You can learn these exercises at physical therapy visits and practice them at home. Physical therapy may also involve: Massage. Heat wraps or applying heat or cold to affected areas. Electrical signals that interrupt pain signals (transcutaneous electrical nerve stimulation, TENS). Weak lasers that reduce pain and swelling (low-level laser therapy). Signals from your body that help you learn to regulate pain (biofeedback). Occupational therapy. This helps you to learn ways to function at home and work with less pain. Recreational therapy. This involves trying new activities or hobbies, such as a physical activity or drawing. Mental health therapy, including: Cognitive behavioral therapy (CBT). This helps you learn coping skills for dealing with pain. Acceptance and commitment therapy (ACT) to change the way you think and react to pain. Relaxation therapies, including muscle relaxation exercises and mindfulness-based stress reduction. Pain management counseling. This may be individual, family, or group counseling.  Receive medical treatments Medical treatments for pain management include: Nerve block injections. These may include a pain blocker and anti-inflammatory medicines. You may  have injections: Near the spine to relieve chronic back or neck pain. Into joints to relieve back or joint pain. Into nerve areas that supply a painful area to relieve body pain. Into muscles (trigger point injections) to relieve some painful muscle conditions. A medical device placed near your spine to help block pain signals and relieve nerve pain or chronic back pain (spinal cord stimulation device). Acupuncture. Follow these instructions at home Medicines Take over-the-counter and prescription medicines only as told by your health care provider. If you are taking pain medicine, ask your health care providers about possible side effects to watch out for. Do not drive or use heavy machinery  while taking prescription opioid pain medicine. Lifestyle  Do not use drugs or alcohol to reduce pain. If you drink alcohol, limit how much you have to: 0-1 drink a day for women who are not pregnant. 0-2 drinks a day for men. Know how much alcohol is in a drink. In the [place] one drink equals one 12 oz bottle of beer (355 mL), one 5 oz glass of wine (148 mL), or one 1 oz glass of hard liquor (44 mL). Do not use any products that contain nicotine or tobacco. These products include cigarettes, chewing tobacco, and vaping devices, such as e-cigarettes. If you need help quitting, ask your health care provider. Eat a healthy diet and maintain a healthy weight. Poor diet and excess weight may make pain worse. Eat foods that are high in fiber. These include fresh fruits and vegetables, whole grains, and beans. Limit foods that are high in fat and processed sugars, such as fried and sweet foods. Exercise regularly. Exercise lowers stress and may help relieve pain. Ask your health care provider what activities and exercises are safe for you. If your health care provider approves, join an exercise class that combines movement and stress reduction. Examples include yoga and tai chi. Get enough sleep. Lack of  sleep may make pain worse. Lower stress as much as possible. Practice stress reduction techniques as told by your therapist. General instructions Work with all your pain management providers to find the treatments that work best for you. You are an important member of your pain management team. There are many things you can do to reduce pain on your own. Consider joining an online or in-person support group for people who have chronic pain. Keep all follow-up visits. This is important. Where to find more information You can find more information about managing pain without opioids from: American Academy of Pain Medicine: painmed.org Institute for Chronic Pain: instituteforchronicpain.org American Chronic Pain Association: theacpa.org Contact a health care provider if: You have side effects from pain medicine. Your pain gets worse or does not get better with treatments or home therapy. You are struggling with anxiety or depression. Summary Many types of pain can be managed without opioids. Chronic pain may respond better to pain management without opioids. Pain is best managed when you and a team of health care providers work together. Pain management without opioids may include non-opioid medicines, medical treatments, physical therapy, mental health therapy, and lifestyle changes. Tell your health care providers if your pain gets worse or is not being managed well enough. This information is not intended to replace advice given to you by your health care provider. Make sure you discuss any questions you have with your health care provider. Document Revised: 11/27/2020 Document Reviewed: 11/27/2020 Elsevier Patient Education  2024 [employer].

## 2023-11-04 ENCOUNTER — Other Ambulatory Visit: Payer: Self-pay | Admitting: Family Medicine

## 2023-11-04 DIAGNOSIS — G8929 Other chronic pain: Secondary | ICD-10-CM

## 2023-11-04 NOTE — Telephone Encounter (Signed)
 Requested medication (s) are due for refill today - yes  Requested medication (s) are on the active medication list -yes  Future visit scheduled -no  Last refill: 10/05/23 #15  Notes to clinic: non delegated Rx  Requested Prescriptions  Pending Prescriptions Disp Refills   oxyCODONE (OXY IR/ROXICODONE) 5 MG immediate release tablet 15 tablet 0    Sig: Take 1 tablet (5 mg total) by mouth every 6 (six) hours as needed for severe pain (pain score 7-10).     Not Delegated - Analgesics:  Opioid Agonists Failed - 11/04/2023  4:22 PM      Failed - This refill cannot be delegated      Failed - Urine Drug Screen completed in last 360 days      Passed - Valid encounter within last 3 months    Recent Outpatient Visits           1 month ago Fall on steps, initial encounter   [facility], Monico Blitz, DO   3 months ago Encounter for medical examination to establish care   [facility], Monico Blitz, DO                 Requested Prescriptions  Pending Prescriptions Disp Refills   oxyCODONE (OXY IR/ROXICODONE) 5 MG immediate release tablet 15 tablet 0    Sig: Take 1 tablet (5 mg total) by mouth every 6 (six) hours as needed for severe pain (pain score 7-10).     Not Delegated - Analgesics:  Opioid Agonists Failed - 11/04/2023  4:22 PM      Failed - This refill cannot be delegated      Failed - Urine Drug Screen completed in last 360 days      Passed - Valid encounter within last 3 months    Recent Outpatient Visits           1 month ago Fall on steps, initial encounter   [facility], Monico Blitz, DO   3 months ago Encounter for medical examination to establish care   [facility], Monico Blitz, DO

## 2023-11-04 NOTE — Telephone Encounter (Signed)
 Copied from CRM [id number]. Topic: Clinical - Medication Refill >> Nov 04, 2023 11:29 AM Emylou G wrote: Most Recent Primary Care Visit:  Provider: Sherlyn Hay  Department: [id number]  Visit Type: PHYSICAL  Date: 09/17/2023  Medication: oxyCODONE (OXY IR/ROXICODONE) 5 MG immediate release tablet  Has the patient contacted their pharmacy? Yes (Agent: If no, request that the patient contact the pharmacy for the refill. If patient does not wish to contact the pharmacy document the reason why and proceed with request.) (Agent: If yes, when and what did the pharmacy advise?) they said to call us  Is this the correct pharmacy for this prescription? Yes If no, delete pharmacy and type the correct one.  This is the patient's preferred pharmacy:  [id number] - [place], [place] - [address] [place] [address] Phone: [phone] Fax: [phone]   Has the prescription been filled recently? No  Is the patient out of the medication? No   Has the patient been seen for an appointment in the last year OR does the patient have an upcoming appointment? Yes  Can we respond through MyChart? No  Agent: Please be advised that Rx refills may take up to 3 business days. We ask that you follow-up with your pharmacy.

## 2023-11-05 ENCOUNTER — Telehealth: Payer: Self-pay

## 2023-11-05 NOTE — Telephone Encounter (Signed)
 left message pharmacy notified

## 2023-11-05 NOTE — Telephone Encounter (Signed)
 received fax requesting refill on the bupropion hcl l 150mg . (but i don't see it in her medication list and i don't see anything in the note where it had been discontinued.), im goingt to continue to keep look to see if i can find anything. pt was last seen on 2-24 next appt 4-14

## 2023-11-05 NOTE — Telephone Encounter (Signed)
 Noted.

## 2023-11-05 NOTE — Telephone Encounter (Signed)
 She is not taking it now, you can disregard

## 2023-11-05 NOTE — Telephone Encounter (Signed)
I don't see the original message

## 2023-11-08 ENCOUNTER — Telehealth: Payer: Self-pay | Admitting: *Deleted

## 2023-11-08 NOTE — Progress Notes (Signed)
 Complex Care Management Note Care Guide Note  11/08/2023 Name: AYEN VIVIANO MRN: [id number] DOB: 11/16/1960  Sinclair Grooms is a 63 y.o. year old female who is a primary care patient of Sherlyn Hay, DO . The community resource team was consulted for assistance with Food Insecurity and hearing aids   SDOH screenings and interventions completed:  Yes     SDOH Interventions Today    Flowsheet Row Most Recent Value  SDOH Interventions   Food Insecurity Interventions Community Resources Provided, [id number] Referral  [Gets [employer] will do an [place] [employer] referal and also email her [place] food banks , will email senior resource nutrition centre near her]  Financial Strain Interventions Community Resources Provided  [Needs hearing aids will email her information about [place] deaf and hard of hearing , Francine Graven has a 399 co pay for hearing aids ,]        Care guide performed the following interventions: Patient provided with information about care guide support team and interviewed to confirm resource needs.  Follow Up Plan:  No further follow up planned at this time. The patient has been provided with needed resources.  Encounter Outcome:  Patient Visit Completed  Dione Booze  [facility] HealthPopulation Health Care Guide  Direct Dial:[phone] Fax:[phone] Website: [email or web address]

## 2023-11-20 ENCOUNTER — Other Ambulatory Visit: Payer: Self-pay | Admitting: Psychiatry

## 2023-11-23 ENCOUNTER — Other Ambulatory Visit: Payer: Self-pay | Admitting: Psychiatry

## 2023-11-23 DIAGNOSIS — F3162 Bipolar disorder, current episode mixed, moderate: Secondary | ICD-10-CM

## 2023-11-25 ENCOUNTER — Other Ambulatory Visit: Payer: Self-pay | Admitting: Family Medicine

## 2023-11-25 DIAGNOSIS — I10 Essential (primary) hypertension: Secondary | ICD-10-CM

## 2023-11-26 NOTE — Telephone Encounter (Signed)
 Requested medications are due for refill today.  yes  Requested medications are on the active medications list.  yes  Last refill. 09/11/2023 #30 1 rf  Future visit scheduled.   no  Notes to clinic.  Labs are expired.    Requested Prescriptions  Pending Prescriptions Disp Refills   losartan (COZAAR) 25 MG tablet [Pharmacy Med Name: LOSARTAN POTASSIUM 25 MG TAB] 30 tablet 1    Sig: TAKE 1 TABLET (25 MG TOTAL) BY MOUTH DAILY.     Cardiovascular:  Angiotensin Receptor Blockers Failed - 11/26/2023 10:58 AM      Failed - Cr in normal range and within 180 days    Creatinine  Date Value Ref Range Status  11/23/2014 0.75 mg/dL Final    Comment:    1.61-0.96 NOTE: New Reference Range  11/06/14    Creatinine, Ser  Date Value Ref Range Status  10/16/2022 0.61 0.44 - 1.00 mg/dL Final         Failed - K in normal range and within 180 days    Potassium  Date Value Ref Range Status  10/16/2022 3.2 (L) 3.5 - 5.1 mmol/L Final  11/23/2014 4.2 mmol/L Final    Comment:    3.5-5.1 NOTE: New Reference Range  11/06/14          Failed - Valid encounter within last 6 months    Recent Outpatient Visits   None            Passed - Patient is not pregnant      Passed - Last BP in normal range    BP Readings from Last 1 Encounters:  09/17/23 119/86
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
## 2023-12-16 ENCOUNTER — Ambulatory Visit (INDEPENDENT_AMBULATORY_CARE_PROVIDER_SITE_OTHER): Payer: Medicare HMO | Admitting: Psychiatry

## 2023-12-16 ENCOUNTER — Encounter: Payer: Self-pay | Admitting: Psychiatry

## 2023-12-16 VITALS — BP 118/82 | HR 114 | Temp 98.3°F | Ht 65.0 in | Wt 133.0 lb

## 2023-12-16 DIAGNOSIS — Z79899 Other long term (current) drug therapy: Secondary | ICD-10-CM

## 2023-12-16 DIAGNOSIS — F419 Anxiety disorder, unspecified: Secondary | ICD-10-CM

## 2023-12-16 DIAGNOSIS — F4310 Post-traumatic stress disorder, unspecified: Secondary | ICD-10-CM | POA: Diagnosis not present

## 2023-12-16 DIAGNOSIS — R000 Tachycardia, unspecified: Secondary | ICD-10-CM | POA: Diagnosis not present

## 2023-12-16 DIAGNOSIS — R413 Other amnesia: Secondary | ICD-10-CM

## 2023-12-16 DIAGNOSIS — F0630 Mood disorder due to known physiological condition, unspecified: Secondary | ICD-10-CM

## 2023-12-16 MED ORDER — CLONAZEPAM 0.25 MG PO TBDP: 0.2500 mg | ORAL_TABLET | Freq: Every day | ORAL | 0 refills | Status: DC | PRN

## 2023-12-16 MED ORDER — CLONAZEPAM 0.5 MG PO TABS: 0.5000 mg | ORAL_TABLET | Freq: Every evening | ORAL | 0 refills | Status: DC | PRN

## 2023-12-16 NOTE — Patient Instructions (Signed)
 Continue Amitriptyline 100 mg at night  Continue quetiapine 400 mg twice a day Decrease clonazepam 0.25 mg in AM, 0.5 mg in PM  Referral for sleep evaluation Obtain labs (TSH, fT4), UDS at [facility] Next appointment: 6/2 at 10 AM

## 2023-12-19 ENCOUNTER — Other Ambulatory Visit: Payer: Self-pay | Admitting: Psychiatry

## 2023-12-27 ENCOUNTER — Ambulatory Visit (INDEPENDENT_AMBULATORY_CARE_PROVIDER_SITE_OTHER): Payer: Medicare HMO | Admitting: Licensed Clinical Social Worker

## 2023-12-27 DIAGNOSIS — Z91199 Patient's noncompliance with other medical treatment and regimen due to unspecified reason: Secondary | ICD-10-CM

## 2023-12-27 NOTE — Progress Notes (Signed)
 Clinician attempted session via face-to-face, but Maria Hodge did not appear for her session.

## 2024-01-25 NOTE — Progress Notes (Signed)
 No show

## 2024-01-31 ENCOUNTER — Ambulatory Visit (INDEPENDENT_AMBULATORY_CARE_PROVIDER_SITE_OTHER): Admitting: Psychiatry

## 2024-01-31 DIAGNOSIS — Z91199 Patient's noncompliance with other medical treatment and regimen due to unspecified reason: Secondary | ICD-10-CM

## 2024-02-14 ENCOUNTER — Other Ambulatory Visit: Payer: Self-pay | Admitting: Psychiatry

## 2024-02-14 ENCOUNTER — Telehealth: Payer: Self-pay

## 2024-02-14 MED ORDER — CLONAZEPAM 0.5 MG PO TABS: 0.5000 mg | ORAL_TABLET | Freq: Every evening | ORAL | 0 refills | Status: DC | PRN

## 2024-02-14 MED ORDER — CLONAZEPAM 0.25 MG PO TBDP: 0.2500 mg | ORAL_TABLET | Freq: Every day | ORAL | 0 refills | Status: DC | PRN

## 2024-02-14 NOTE — Telephone Encounter (Signed)
 ordered

## 2024-02-14 NOTE — Telephone Encounter (Signed)
 pt left message that she is out of her clonazepam . pt was last seen on 4-17 (message was sent to front desk to call pt for a f/u visit.)

## 2024-02-14 NOTE — Telephone Encounter (Signed)
 pt notified that rx was sent to the pharamacy. pt was also transfered to the front desk to make a follow up appt.

## 2024-02-16 ENCOUNTER — Other Ambulatory Visit: Payer: Self-pay

## 2024-02-16 ENCOUNTER — Emergency Department
Admission: EM | Admit: 2024-02-16 | Discharge: 2024-02-16 | Discharge status: Against Medical Advice | Attending: Emergency Medicine | Admitting: Emergency Medicine

## 2024-02-16 DIAGNOSIS — R1032 Left lower quadrant pain: Secondary | ICD-10-CM | POA: Diagnosis present

## 2024-02-16 DIAGNOSIS — R112 Nausea with vomiting, unspecified: Secondary | ICD-10-CM | POA: Insufficient documentation

## 2024-02-16 DIAGNOSIS — Z5321 Procedure and treatment not carried out due to patient leaving prior to being seen by health care provider: Secondary | ICD-10-CM | POA: Diagnosis not present

## 2024-02-16 DIAGNOSIS — R197 Diarrhea, unspecified: Secondary | ICD-10-CM | POA: Insufficient documentation

## 2024-02-16 LAB — LIPASE, BLOOD: Lipase: 47 U/L (ref 11–51)

## 2024-02-16 LAB — COMPREHENSIVE METABOLIC PANEL WITH GFR
ALT: 11 U/L (ref 0–44)
AST: 17 U/L (ref 15–41)
Albumin: 4.2 g/dL (ref 3.5–5.0)
Alkaline Phosphatase: 70 U/L (ref 38–126)
Anion gap: 15 (ref 5–15)
BUN: 16 mg/dL (ref 8–23)
CO2: 20 mmol/L — ABNORMAL LOW (ref 22–32)
Calcium: 9.3 mg/dL (ref 8.9–10.3)
Chloride: 102 mmol/L (ref 98–111)
Creatinine, Ser: 0.62 mg/dL (ref 0.44–1.00)
GFR, Estimated: >60 mL/min (ref >60)
Glucose, Bld: 97 mg/dL (ref 70–99)
Potassium: 3.8 mmol/L (ref 3.5–5.1)
Sodium: 137 mmol/L (ref 135–145)
Total Bilirubin: 1.2 mg/dL (ref 0.0–1.2)
Total Protein: 8.5 g/dL — ABNORMAL HIGH (ref 6.5–8.1)

## 2024-02-16 LAB — CBC
HCT: 54.6 % — ABNORMAL HIGH (ref 36.0–46.0)
Hemoglobin: 17.6 g/dL — ABNORMAL HIGH (ref 12.0–15.0)
MCH: 29.5 pg (ref 26.0–34.0)
MCHC: 32.2 g/dL (ref 30.0–36.0)
MCV: 91.6 fL (ref 80.0–100.0)
Platelets: 163 10*3/uL (ref 150–400)
RBC: 5.96 MIL/uL — ABNORMAL HIGH (ref 3.87–5.11)
RDW: 16.3 % — ABNORMAL HIGH (ref 11.5–15.5)
WBC: 10.8 10*3/uL — ABNORMAL HIGH (ref 4.0–10.5)
nRBC: 0 % (ref 0.0–0.2)

## 2024-02-16 LAB — TROPONIN I (HIGH SENSITIVITY): Troponin I (High Sensitivity): 6 ng/L (ref <18)

## 2024-02-16 NOTE — ED Notes (Signed)
 No answer when called several times from lobby

## 2024-02-16 NOTE — ED Triage Notes (Signed)
 Pt present via POV c/o LLQ abd pain since Monday. Reports hx of hernia repair in same area in December of 2024. Reports N/V/D.

## 2024-02-17 ENCOUNTER — Other Ambulatory Visit: Payer: Self-pay | Admitting: Psychiatry

## 2024-02-18 ENCOUNTER — Telehealth: Payer: Self-pay | Admitting: Psychiatry

## 2024-02-18 ENCOUNTER — Telehealth: Payer: Self-pay

## 2024-02-18 ENCOUNTER — Other Ambulatory Visit: Payer: Self-pay | Admitting: Psychiatry

## 2024-02-18 DIAGNOSIS — F3162 Bipolar disorder, current episode mixed, moderate: Secondary | ICD-10-CM

## 2024-02-18 MED ORDER — QUETIAPINE FUMARATE 400 MG PO TABS: 400.0000 mg | ORAL_TABLET | Freq: Two times a day (BID) | ORAL | 0 refills | Status: DC

## 2024-02-18 NOTE — Telephone Encounter (Signed)
 Patient called stating that she needs a refill of QUEtiapine  (SEROQUEL ) 400 MG tablet   Please advise

## 2024-02-18 NOTE — Telephone Encounter (Signed)
 Per the database, it indicates that she filled clonazepam  on 6/16. Could you please verify with the pharmacy that this is accurate? If the medication has not been filled, please advise her to do so, and update the patient.

## 2024-02-18 NOTE — Telephone Encounter (Signed)
 Ordered

## 2024-02-21 NOTE — Telephone Encounter (Signed)
 Spoke to [place] at the patients pharmacy she stated that the patient picked up the clonazepam  on 02-14-24

## 2024-02-21 NOTE — Telephone Encounter (Signed)
 Patient has been updated she did stated that she picked up the medication on 02-14-24

## 2024-02-21 NOTE — Telephone Encounter (Signed)
 Please update the patient and notify me if there was any miscommunication regarding this medication.

## 2024-02-25 ENCOUNTER — Other Ambulatory Visit: Payer: Self-pay | Admitting: Family Medicine

## 2024-02-25 DIAGNOSIS — I10 Essential (primary) hypertension: Secondary | ICD-10-CM
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
## 2024-03-02 ENCOUNTER — Telehealth: Payer: Self-pay | Admitting: Sleep Medicine

## 2024-03-02 ENCOUNTER — Encounter: Payer: Self-pay | Admitting: Psychiatry

## 2024-03-02 ENCOUNTER — Other Ambulatory Visit: Payer: Self-pay

## 2024-03-02 ENCOUNTER — Ambulatory Visit (INDEPENDENT_AMBULATORY_CARE_PROVIDER_SITE_OTHER): Admitting: Psychiatry

## 2024-03-02 VITALS — BP 124/89 | HR 114 | Temp 98.2°F | Ht 65.0 in | Wt 129.2 lb

## 2024-03-02 DIAGNOSIS — F4310 Post-traumatic stress disorder, unspecified: Secondary | ICD-10-CM | POA: Diagnosis not present

## 2024-03-02 DIAGNOSIS — G4700 Insomnia, unspecified: Secondary | ICD-10-CM

## 2024-03-02 DIAGNOSIS — R000 Tachycardia, unspecified: Secondary | ICD-10-CM | POA: Diagnosis not present

## 2024-03-02 DIAGNOSIS — F419 Anxiety disorder, unspecified: Secondary | ICD-10-CM | POA: Diagnosis not present

## 2024-03-02 DIAGNOSIS — Z79899 Other long term (current) drug therapy: Secondary | ICD-10-CM

## 2024-03-02 DIAGNOSIS — F0630 Mood disorder due to known physiological condition, unspecified: Secondary | ICD-10-CM | POA: Diagnosis not present

## 2024-03-02 MED ORDER — PRAZOSIN HCL 1 MG PO CAPS: 1.0000 mg | ORAL_CAPSULE | Freq: Every day | ORAL | 1 refills | Status: DC

## 2024-03-02 NOTE — Patient Instructions (Signed)
 Continue Amitriptyline  100 mg at night  Continue quetiapine  400 mg twice a day Start prazosin 1 mg at night  Continue clonazepam  0.25 mg in AM, 0.5 mg in PM  Referral for sleep evaluation Obtain labs (TSH, fT4, vitamin B12, folate), UDS at [facility] Next appointment: 8/7 at 4 pm

## 2024-03-02 NOTE — Telephone Encounter (Signed)
 LVMTCB to schedule sleep consult.

## 2024-03-09 NOTE — Telephone Encounter (Signed)
 LVMTCB to schedule sleep consult.

## 2024-03-10 ENCOUNTER — Other Ambulatory Visit: Payer: Self-pay | Admitting: Psychiatry

## 2024-03-10 ENCOUNTER — Telehealth: Payer: Self-pay

## 2024-03-10 MED ORDER — CLONAZEPAM 0.25 MG PO TBDP
0.2500 mg | ORAL_TABLET | Freq: Every day | ORAL | 0 refills | Status: DC | PRN
Start: 2024-03-15 — End: 2024-04-06

## 2024-03-10 MED ORDER — CLONAZEPAM 0.5 MG PO TABS: 0.5000 mg | ORAL_TABLET | Freq: Every evening | ORAL | 0 refills | Status: DC | PRN

## 2024-03-10 NOTE — Telephone Encounter (Signed)
 Medication has been sent to the pharmacy to be filled on the next scheduled due date, 7/16.

## 2024-03-10 NOTE — Telephone Encounter (Signed)
 Pt.notified

## 2024-03-10 NOTE — Telephone Encounter (Signed)
 pt left message that she needs refills on the klonopins. pt was last seen on 07-03 next appt 08-07

## 2024-03-25 ENCOUNTER — Other Ambulatory Visit: Payer: Self-pay | Admitting: Psychiatry
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
## 2024-04-06 ENCOUNTER — Ambulatory Visit (INDEPENDENT_AMBULATORY_CARE_PROVIDER_SITE_OTHER): Admitting: Psychiatry

## 2024-04-06 ENCOUNTER — Encounter: Payer: Self-pay | Admitting: Psychiatry

## 2024-04-06 ENCOUNTER — Other Ambulatory Visit: Payer: Self-pay

## 2024-04-06 VITALS — BP 122/84 | HR 112 | Temp 98.0°F | Ht 65.0 in | Wt 129.2 lb

## 2024-04-06 DIAGNOSIS — F4310 Post-traumatic stress disorder, unspecified: Secondary | ICD-10-CM | POA: Diagnosis not present

## 2024-04-06 DIAGNOSIS — F419 Anxiety disorder, unspecified: Secondary | ICD-10-CM

## 2024-04-06 DIAGNOSIS — F0630 Mood disorder due to known physiological condition, unspecified: Secondary | ICD-10-CM

## 2024-04-06 MED ORDER — CLONAZEPAM 0.5 MG PO TABS: 0.5000 mg | ORAL_TABLET | Freq: Every evening | ORAL | 1 refills | Status: DC | PRN

## 2024-04-06 MED ORDER — QUETIAPINE FUMARATE 400 MG PO TABS: 400.0000 mg | ORAL_TABLET | Freq: Two times a day (BID) | ORAL | 0 refills | Status: DC

## 2024-04-06 MED ORDER — PRAZOSIN HCL 2 MG PO CAPS: 2.0000 mg | ORAL_CAPSULE | Freq: Every day | ORAL | 1 refills | Status: DC

## 2024-04-06 MED ORDER — CLONAZEPAM 0.25 MG PO TBDP: 0.2500 mg | ORAL_TABLET | Freq: Every day | ORAL | 1 refills | Status: DC | PRN

## 2024-04-06 MED ORDER — AMITRIPTYLINE HCL 100 MG PO TABS: 100.0000 mg | ORAL_TABLET | Freq: Every day | ORAL | 0 refills | Status: DC

## 2024-04-06 NOTE — Patient Instructions (Signed)
 Continue Amitriptyline  100 mg at night  Continue quetiapine  400 mg twice a day Increase prazosin  2 mg at night  Continue clonazepam  0.25 mg in AM, 0.5 mg in PM Referral for sleep evaluation Obtain labs (TSH, fT4, vitamin B12, folate), UDS at [facility] Next appointment: 9/22 at 4:30

## 2024-04-14 ENCOUNTER — Telehealth: Payer: Self-pay

## 2024-04-14 NOTE — Telephone Encounter (Signed)
 pt called states that pharmacy would not fill the klonopin  0.5. she states that she got the 0.25

## 2024-04-14 NOTE — Telephone Encounter (Signed)
 pt notified that pharmacy will get ready and to check with pharmacy later today.

## 2024-04-14 NOTE — Telephone Encounter (Signed)
 called pharmacy they will get ready.

## 2024-05-04 ENCOUNTER — Other Ambulatory Visit: Payer: Self-pay | Admitting: Psychiatry

## 2024-05-07 ENCOUNTER — Other Ambulatory Visit: Payer: Self-pay | Admitting: Psychiatry
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
## 2024-05-22 ENCOUNTER — Ambulatory Visit (INDEPENDENT_AMBULATORY_CARE_PROVIDER_SITE_OTHER): Admitting: Psychiatry

## 2024-05-22 ENCOUNTER — Other Ambulatory Visit: Payer: Self-pay

## 2024-05-22 ENCOUNTER — Encounter: Payer: Self-pay | Admitting: Psychiatry

## 2024-05-22 VITALS — BP 111/76 | HR 109 | Temp 97.4°F | Ht 65.0 in | Wt 133.2 lb

## 2024-05-22 DIAGNOSIS — F0630 Mood disorder due to known physiological condition, unspecified: Secondary | ICD-10-CM | POA: Diagnosis not present

## 2024-05-22 DIAGNOSIS — F4310 Post-traumatic stress disorder, unspecified: Secondary | ICD-10-CM

## 2024-05-22 DIAGNOSIS — F419 Anxiety disorder, unspecified: Secondary | ICD-10-CM

## 2024-05-22 DIAGNOSIS — Z79899 Other long term (current) drug therapy: Secondary | ICD-10-CM | POA: Diagnosis not present

## 2024-05-22 DIAGNOSIS — G4700 Insomnia, unspecified: Secondary | ICD-10-CM

## 2024-05-22 NOTE — Patient Instructions (Signed)
 Continue Amitriptyline  100 mg at night  Continue quetiapine  400 mg twice a day Continue prazosin  2 mg at night  Decrease clonazepam  0.25 mg twice a day s needed for anxiety   Referred for sleep evaluation Obtain labs (TSH, fT4, vitamin B12, folate), UDS at [facility] Next appointment: 11/17 at 4:30

## 2024-06-07 ENCOUNTER — Other Ambulatory Visit: Payer: Self-pay | Admitting: Psychiatry

## 2024-06-26 NOTE — Progress Notes (Signed)
 Maria Hodge                                          MRN: [id number]   06/26/2024   The [facility] Quality Team Specialist reviewed this patient medical record for the purposes of chart review for care gap closure. The following were reviewed: chart review for care gap closure-colorectal cancer screening.    [facility] Quality Team

## 2024-07-04 ENCOUNTER — Other Ambulatory Visit: Payer: Self-pay | Admitting: Psychiatry

## 2024-07-04 ENCOUNTER — Telehealth: Payer: Self-pay

## 2024-07-04 NOTE — Telephone Encounter (Signed)
 Medication refill - Call from patient requesting a new Clonazepam  order, last e-scribed 06/07/24 and patient's next appointment 07/17/24. Patient requests [facility] [place]

## 2024-07-04 NOTE — Telephone Encounter (Signed)
 Could you please confirm the current dose she is taking for clonazepam ? We previously discussed reducing it to 0.25 mg twice daily. It appears the covering provider may have prescribed the original dose of 0.5 mg and 0.25 mg. If she is still taking it that way, please remind her that we plan to reduce it to 0.25 mg twice daily with the new order.

## 2024-07-05 ENCOUNTER — Other Ambulatory Visit: Payer: Self-pay | Admitting: Psychiatry

## 2024-07-05 MED ORDER — CLONAZEPAM 0.25 MG PO TBDP: 0.2500 mg | ORAL_TABLET | Freq: Two times a day (BID) | ORAL | 0 refills | Status: DC

## 2024-07-05 NOTE — Telephone Encounter (Signed)
 Please advise her to take clonazepam  0.25 mg twice daily moving forward, as discussed during her last visit.

## 2024-07-05 NOTE — Telephone Encounter (Signed)
 Called patient advise her to take the Clonazepam  0.25 mg twice daily moving forward patient voiced understanding

## 2024-07-05 NOTE — Telephone Encounter (Signed)
 Medication management - Call with patient to verify her dosage of requested refills of Clonazepam . Patient reported she takes Clonazepam  0.25 mg diintegrating tablets, daily PRN #30 and also the regular Clonazepam  0.5 mg tablet at bedtime, #30 and requests both new orders be [facility] [place].  Agreed to send this information with both requests back to Dr. Vickey.
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
## 2024-07-17 ENCOUNTER — Encounter: Payer: Self-pay | Admitting: Psychiatry

## 2024-07-17 ENCOUNTER — Ambulatory Visit (INDEPENDENT_AMBULATORY_CARE_PROVIDER_SITE_OTHER): Admitting: Psychiatry

## 2024-07-17 VITALS — BP 106/68 | HR 101 | Temp 98.3°F | Ht 62.25 in | Wt 125.0 lb

## 2024-07-17 DIAGNOSIS — F0630 Mood disorder due to known physiological condition, unspecified: Secondary | ICD-10-CM | POA: Diagnosis not present

## 2024-07-17 DIAGNOSIS — G4700 Insomnia, unspecified: Secondary | ICD-10-CM | POA: Diagnosis not present

## 2024-07-17 DIAGNOSIS — F419 Anxiety disorder, unspecified: Secondary | ICD-10-CM | POA: Diagnosis not present

## 2024-07-17 DIAGNOSIS — F4310 Post-traumatic stress disorder, unspecified: Secondary | ICD-10-CM

## 2024-07-17 MED ORDER — PRAZOSIN HCL 2 MG PO CAPS: 2.0000 mg | ORAL_CAPSULE | Freq: Every day | ORAL | 0 refills | Status: AC

## 2024-07-17 MED ORDER — QUETIAPINE FUMARATE 400 MG PO TABS: 400.0000 mg | ORAL_TABLET | Freq: Two times a day (BID) | ORAL | 0 refills | Status: AC

## 2024-07-17 MED ORDER — CLONAZEPAM 0.25 MG PO TBDP: 0.2500 mg | ORAL_TABLET | Freq: Two times a day (BID) | ORAL | 1 refills | Status: DC

## 2024-07-17 MED ORDER — AMITRIPTYLINE HCL 100 MG PO TABS: 100.0000 mg | ORAL_TABLET | Freq: Every day | ORAL | 0 refills | Status: AC

## 2024-07-17 NOTE — Patient Instructions (Signed)
 Continue Amitriptyline  100 mg at night  Continue quetiapine  400 mg twice a day Continue prazosin  2 mg at night  Continue clonazepam  0.25 mg twice a day s needed for anxiety Referred for sleep evaluation Obtain labs (TSH, fT4, vitamin B12, folate), UDS at [facility] Next appointment: 1/13 at 4:30

## 2024-07-18 ENCOUNTER — Ambulatory Visit: Payer: Medicare HMO

## 2024-08-06 ENCOUNTER — Other Ambulatory Visit: Payer: Self-pay | Admitting: Psychiatry
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
## 2024-09-12 ENCOUNTER — Ambulatory Visit: Admitting: Psychiatry

## 2024-10-01 ENCOUNTER — Other Ambulatory Visit: Payer: Self-pay | Admitting: Psychiatry

## 2024-10-02 NOTE — Telephone Encounter (Signed)
 Please contact to make a follow up. I can see her for virtual visit if it works for her.

## 2024-11-08 ENCOUNTER — Ambulatory Visit

## 2024-12-05 ENCOUNTER — Ambulatory Visit: Admitting: Psychiatry
